# Patient Record
Sex: Female | Born: 1945
Health system: Southern US, Community
[De-identification: ages and names within clinical notes are randomized; demographics above are authoritative.]

## PROBLEM LIST (undated history)

## (undated) DIAGNOSIS — H269 Unspecified cataract: Secondary | ICD-10-CM

## (undated) DIAGNOSIS — T7840XA Allergy, unspecified, initial encounter: Secondary | ICD-10-CM

## (undated) DIAGNOSIS — E559 Vitamin D deficiency, unspecified: Secondary | ICD-10-CM

## (undated) DIAGNOSIS — G709 Myoneural disorder, unspecified: Secondary | ICD-10-CM

## (undated) DIAGNOSIS — K219 Gastro-esophageal reflux disease without esophagitis: Secondary | ICD-10-CM

## (undated) DIAGNOSIS — E119 Type 2 diabetes mellitus without complications: Secondary | ICD-10-CM

## (undated) DIAGNOSIS — J45909 Unspecified asthma, uncomplicated: Secondary | ICD-10-CM

## (undated) DIAGNOSIS — F329 Major depressive disorder, single episode, unspecified: Secondary | ICD-10-CM

## (undated) DIAGNOSIS — L509 Urticaria, unspecified: Secondary | ICD-10-CM

## (undated) DIAGNOSIS — F32A Depression, unspecified: Secondary | ICD-10-CM

## (undated) DIAGNOSIS — J449 Chronic obstructive pulmonary disease, unspecified: Secondary | ICD-10-CM

## (undated) DIAGNOSIS — I10 Essential (primary) hypertension: Secondary | ICD-10-CM

## (undated) DIAGNOSIS — Z8619 Personal history of other infectious and parasitic diseases: Secondary | ICD-10-CM

## (undated) DIAGNOSIS — IMO0001 Reserved for inherently not codable concepts without codable children: Secondary | ICD-10-CM

## (undated) DIAGNOSIS — IMO0002 Reserved for concepts with insufficient information to code with codable children: Secondary | ICD-10-CM

## (undated) DIAGNOSIS — M1990 Unspecified osteoarthritis, unspecified site: Secondary | ICD-10-CM

## (undated) HISTORY — DX: Myoneural disorder, unspecified: G70.9

## (undated) HISTORY — DX: Unspecified asthma, uncomplicated: J45.909

## (undated) HISTORY — PX: TUBAL LIGATION: SHX77

## (undated) HISTORY — DX: Allergy, unspecified, initial encounter: T78.40XA

## (undated) HISTORY — DX: Essential (primary) hypertension: I10

## (undated) HISTORY — DX: Depression, unspecified: F32.A

## (undated) HISTORY — PX: TONSILLECTOMY: SUR1361

## (undated) HISTORY — DX: Vitamin D deficiency, unspecified: E55.9

## (undated) HISTORY — DX: Reserved for concepts with insufficient information to code with codable children: IMO0002

## (undated) HISTORY — DX: Chronic obstructive pulmonary disease, unspecified: J44.9

## (undated) HISTORY — DX: Unspecified cataract: H26.9

## (undated) HISTORY — DX: Personal history of other infectious and parasitic diseases: Z86.19

## (undated) HISTORY — PX: ABDOMINAL HYSTERECTOMY: SHX81

## (undated) HISTORY — DX: Major depressive disorder, single episode, unspecified: F32.9

## (undated) HISTORY — PX: APPENDECTOMY: SHX54

## (undated) HISTORY — DX: Type 2 diabetes mellitus without complications: E11.9

## (undated) HISTORY — DX: Urticaria, unspecified: L50.9

---

## 2000-09-17 ENCOUNTER — Other Ambulatory Visit: Admission: RE | Admit: 2000-09-17 | Discharge: 2000-09-17 | Payer: Self-pay | Admitting: Family Medicine

## 2006-08-08 ENCOUNTER — Encounter: Admission: RE | Admit: 2006-08-08 | Discharge: 2006-08-08 | Payer: Self-pay | Admitting: Family Medicine

## 2006-08-08 LAB — HM MAMMOGRAPHY: HM Mammogram: NORMAL

## 2007-01-18 ENCOUNTER — Emergency Department (HOSPITAL_COMMUNITY): Admission: EM | Admit: 2007-01-18 | Discharge: 2007-01-18 | Payer: Self-pay | Admitting: Family Medicine

## 2007-01-25 ENCOUNTER — Emergency Department (HOSPITAL_COMMUNITY): Admission: EM | Admit: 2007-01-25 | Discharge: 2007-01-26 | Payer: Self-pay | Admitting: Emergency Medicine

## 2007-05-28 ENCOUNTER — Emergency Department (HOSPITAL_COMMUNITY): Admission: EM | Admit: 2007-05-28 | Discharge: 2007-05-28 | Payer: Self-pay | Admitting: Emergency Medicine

## 2007-05-29 ENCOUNTER — Encounter (INDEPENDENT_AMBULATORY_CARE_PROVIDER_SITE_OTHER): Payer: Self-pay | Admitting: Surgery

## 2007-05-29 ENCOUNTER — Ambulatory Visit (HOSPITAL_COMMUNITY): Admission: EM | Admit: 2007-05-29 | Discharge: 2007-05-30 | Payer: Self-pay | Admitting: Emergency Medicine

## 2007-05-30 HISTORY — PX: CHOLECYSTECTOMY: SHX55

## 2007-08-08 LAB — HM PAP SMEAR: HM Pap smear: NORMAL

## 2007-08-19 ENCOUNTER — Ambulatory Visit (HOSPITAL_COMMUNITY): Admission: RE | Admit: 2007-08-19 | Discharge: 2007-08-19 | Payer: Self-pay | Admitting: Family Medicine

## 2009-02-26 IMAGING — CR DG TIBIA/FIBULA 2V*R*
2 series · 2 of 2 positions shown · non-contrast
Comparison: none

CLINICAL DATA: Trauma

Right tibia-fibula 2 view:
No previous for comparison. There is no evidence of fracture.  Normal alignment.
There is no evidence of arthropathy or other focal bone abnormality.  Soft
tissues are unremarkable.

[view not recorded (1 of 2)]
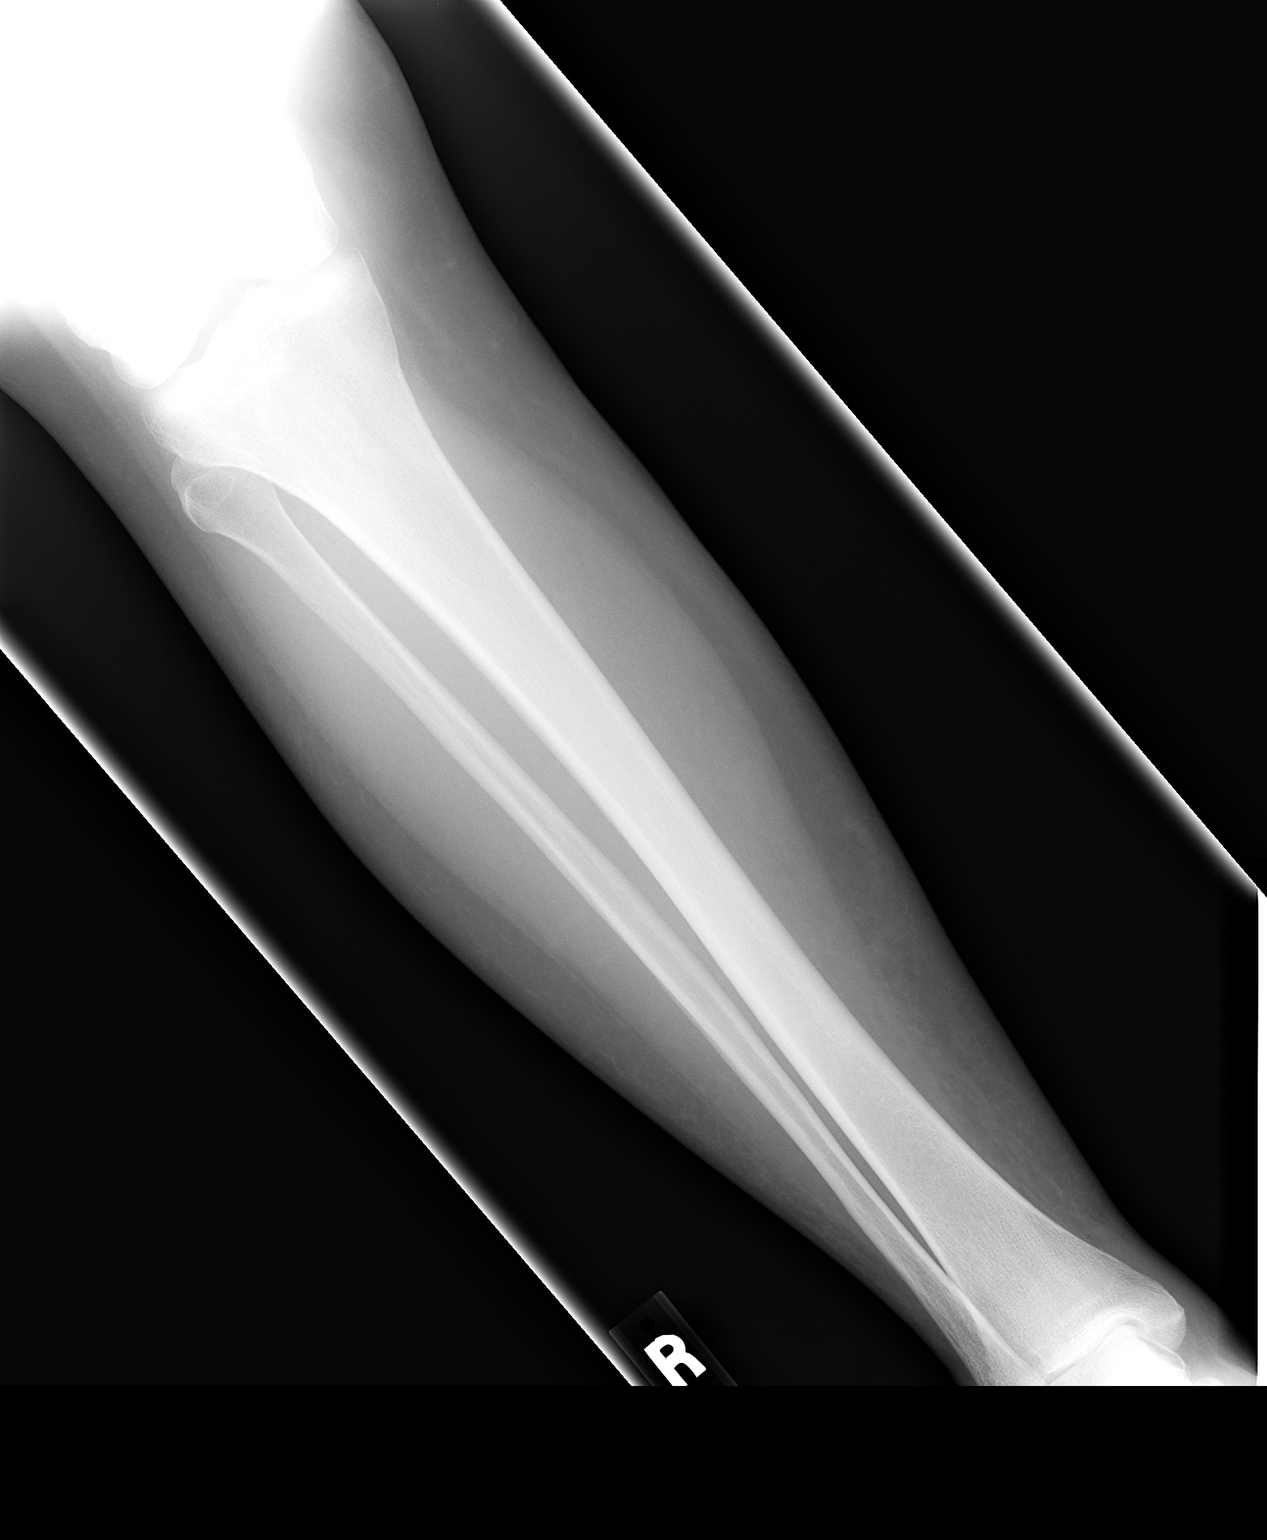

[view not recorded (2 of 2)]
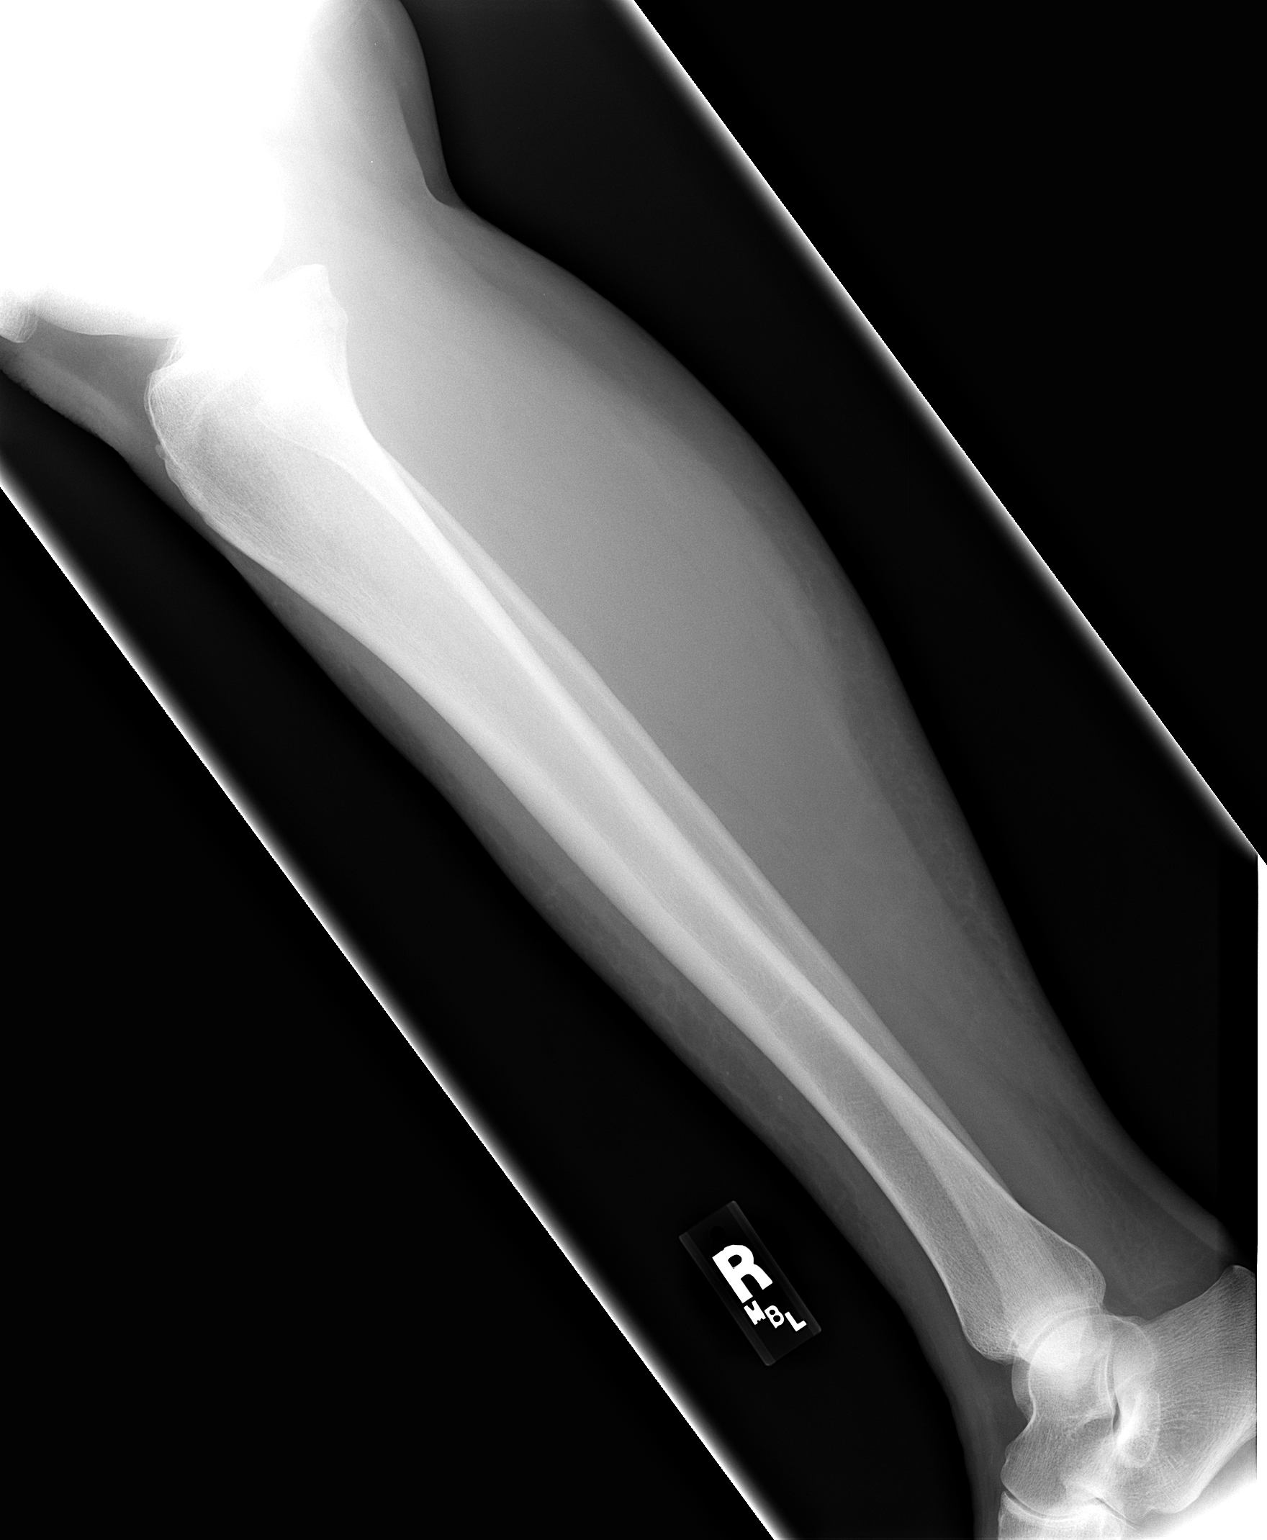

[2 of 2 positions shown; findings below may reference images not displayed]

IMPRESSION: Negative.

## 2010-02-19 ENCOUNTER — Encounter: Payer: Self-pay | Admitting: Family Medicine

## 2010-03-01 ENCOUNTER — Encounter: Payer: Self-pay | Admitting: Family Medicine

## 2010-06-13 NOTE — Consult Note (Signed)
Amanda Pope, Amanda Pope                 ACCOUNT NO.:  [id number]   MEDICAL RECORD NO.:  [id number]          PATIENT TYPE:  INP   LOCATION:  0105                         FACILITY:  [facility]   PHYSICIAN:  Ardeth Sportsman, MD     DATE OF BIRTH:  Jul 26, 1945   DATE OF CONSULTATION:  05/29/2007  DATE OF DISCHARGE:                                 CONSULTATION   PRIMARY CARE PHYSICIAN:  Amanda Pope in [facility].   REASON FOR CONSULTATION AND ADMISSION:  Probable acute cholecystitis.   HISTORY OF PRESENT ILLNESS:  Amanda Pope is a 65 year old female, otherwise  rather healthy, with some intermittent episodes of abdominal pain for  the past few months.  She had been given her Prilosec that seemed to  help it.  It seemed resolved, but she had noted that at night she  occasionally gets some pain with some nausea and bloating.  It has  typically been left of midline and radiating to her back.  She had a  worsening episode 2 days ago and went to the emergency room where she  was evaluated with evidence of perhaps some early cholecystitis on CT  scan, but she was not particularly tender on the right upper quadrant.  She was recommended to have followup with her primary care physician.  She saw Amanda Pope today who was more concerned about cholecystitis  since the patient's pain seemed to be more focused now on the right  upper quadrant with some nausea.  She has had markedly decreased  appetite.  She had some subjective fevers but no definite chills or  sweats.  She denies any sick contacts or travel history.  No significant  amount of constipation.  This pain seems to be a little bit different  than her other pain.  She did try some Tums and Rolaids last night  without any improvement.   PAST MEDICAL HISTORY:  1. Hypertension.  2. Asthma.  3. Chronic pain.  4. Question of gastroparesis and peptic ulcer disease but no objective      diagnosis of this by endoscopy or other  evaluation.   SURGERIES:  She had abdominal hysterectomy many years ago for bleeding  but not cancer.   SOCIAL HISTORY:  She works Research scientist (life sciences) in [employer] and was  rather busy with this.  She can walk up to a mile a day and is rather  physically active at work.  She smokes about 1/2 pack a day, giving her  probably about a 30-pack/year history of tobacco since she tapered down.  She occasionally drinks some alcohol but no severe binging or other  abnormalities.  No drug use.  She is here today with her son and lives  close to her family.   FAMILY HISTORY:  Noncontributory for any major hepatobiliary or  pancreatic disorders or any major GI tract disorders, irritable bowel  syndrome, or irritable bowel syndrome.   ALLERGIES:  NO KNOWN DRUG ALLERGIES.   MEDICATIONS:  Premarin, amlodipine, Advair Diskus, aspirin,  hydromorphone, Dilaudid, and promethazine, and she occasionally takes  Prilosec over the  counter.   REVIEW OF SYSTEMS:  Noted per HPI.  CONSTITUTIONAL:  She has had a  little bit of weight loss recently secondary decreased appetite.  ENT,  cardiac, respiratory, musculoskeletal, neurological, psychiatric, HEENT,  lymph, dermatological, and breast are otherwise negative urine a CT of  the neck, but hepatic and renal otherwise negative.  No history of UTIs  or renal colic for or kidney stones.  GI: As noted above.  No  hematemesis medication or melena dermatologic otherwise negative.   PHYSICAL EXAMINATION:  VITAL SIGNS: Her temperature is 99.5.  Pulse was  100, respirations 20.  Her pain was 8/10 initially, now down to about  4/10 after getting some IV narcotic medications.  Oxygen saturations 92%  on room air.  Her blood pressure is 122/65.  GENERAL:  She is a well-developed, well-nourished overweight not frankly  obese female lying uncomfortable but not frankly toxic.  PSYCH:  She is pleasant and interactive with at least average  intelligence.  No evidence of any  dementia, delirium, psychosis, or  paranoia.  HEENT:  Eyes: Pupils are equally round and reactive to light.  Extraocular movements are intact.  The sclerae are not icteric or  injected.  NEUROLOGICAL:  Cranial nerves II through XII are intact.  Hand grips  5/5, equal, and symmetrical.  No resting or intention tremors.  No  focal, sensory, or motor deficits.  HEENT:  She is normocephalic with no facial asymmetry.  Her mucous  membranes are dry but nasopharynx and oropharynx clear.  NECK:  Supple without any masses.  Trachea is midline.  HEART:  Regular rate and rhythm.  No murmurs, gallops, or rubs.  CHEST:  Clear to auscultation bilaterally.  No wheezes, rales, or  rhonchi.  No pain to rib or sternal compression.  ABDOMEN:  She is overweight but soft.  She is exquisitely tender in the  right upper quadrant with a positive Murphy's sign.  The left upper  quadrant is mildly uncomfortable.  She has a well-healed infraumbilical  midline incision with no obvious incisional or inguinal hernias.  GENITOURINARY:  Normal external female genitalia.  She is wearing a pad  but no obvious vaginal bleeding or discharge.  No inguinal hernias.  RECTAL:  Deferred per patient request.  MUSCULOSKELETAL:  Full range of motion in the shoulders, elbows, and  wrists as well as hips, knees, and ankles and her cervical, thoracic,  lumbar, and sacral spine.  LYMPH:  No head, axial, or groin lymphadenopathy.  BREASTS:  No obvious discharge or skin changes.   LABORATORY VALUES:  Her white count was 13.7, now 17.90.  Liver function  tests:  She has a bilirubin of 1.9 which is elevated from 0.7 yesterday.  Alkaline phosphatase was elevated as well.  She has a CT scan which  shows some mild gallbladder wall thickening and maybe a little bit of  fluid and some definite sludge and probable stones in the gallbladder  but no ductal dilatation.  There is no evidence of any free fluid or  free air.  This was done last  night about 30 hours ago.   ASSESSMENT AND PLAN:  The patient is a 65 year old female with  intermittent upper abdominal pain now focused on the right upper  quadrant with positive Murphy's sign with leukocytosis and CT scan, all  concerning for cholecystitis.   The anatomy and physiology of hepatobiliary and pancreatic function were  discussed.  Her history of gallstones with pathophysiology was  discussed.  The  risks of cholecystitis, sepsis, shock, perforation, and  death were discussed.  The options were discussed and recommendations  made for diagnostic laparoscopy with probable laparoscopic  cholecystectomy with intraoperative cholangiogram.  The possibilities of  converting to open or the need for a  drainage tube and with delayed cholecystectomy were discussed as well,  and numerous other risks were discussed as well.  Alternatives and  benefits were discussed, and questions were answered.  She and her son  agreed to proceed.  We will proceed with this, hopefully tonight.      Ardeth Sportsman, MD  Electronically Signed     SCG/MEDQ  D:  05/29/2007  T:  05/29/2007  Job:  [id number]   cc:   Doug Sou, M.D.  Fax: [phone]   Priscille Heidelberg. Pamalee Leyden, MD

## 2010-06-13 NOTE — Op Note (Signed)
Amanda Pope, Amanda Pope                 ACCOUNT NO.:  [id number]   MEDICAL RECORD NO.:  [id number]          PATIENT TYPE:  INP   LOCATION:  0105                         FACILITY:  [facility]   PHYSICIAN:  Ardeth Sportsman, MD     DATE OF BIRTH:  Sep 01, 1945   DATE OF PROCEDURE:  05/29/2007  DATE OF DISCHARGE:                               OPERATIVE REPORT   PRIMARY CARE PHYSICIAN:  Dr. Tanya Nones, I believe, of [facility].   SURGEON:  Dr. Karie Soda.   PREOPERATIVE DIAGNOSIS:  Acute cholecystitis.   POSTOPERATIVE DIAGNOSES:  1. Acute cholecystitis.  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   PROCEDURE PERFORMED:  Laparoscopic cholecystectomy with intraoperative  cholangiogram.   ANESTHESIA:  1. General anesthesia.  2. Local anesthetic as a specimen block around all port sites.   SPECIMEN:  Gallbladder.   DRAINS:  None.   ESTIMATED BLOOD LOSS:  Less than 30 mL.   COMPLICATIONS:  None apparent.   INDICATIONS:  Amanda Pope is a 65 year old female with intermittent  episodes of upper abdominal pain, primary left side, who recently had  worsening pain.  She went to the emergency room but seemed to improve  and went home.  However, she worsened and went and saw her primary care  physician who was concerned by cholecystitis.  CAT scan done a day and a  half ago had showed some pericholecystic fluid as well as some concern  for cholecystitis.  She was sent to the emergency room, and Dr.  Ethelda Chick confirmed this.  Surgical consult was requested, and I agreed  she had concern for cholecystitis.   The anatomy and physiology of the digestive tract was explained and,  hepatobiliary and pancreatic function was explained.  Pathophysiology of  gallstones and natural history of cholecystitis was discussed.  Options  discussed.  Recommendations made for admission IV antibiotics and  laparoscopic cholecystectomy with intraoperative cholangiogram.  Risks,  benefits, and alternatives  were discussed in detail.  Questions  answered.  She agreed to proceed.   FINDINGS:  She had definite gallbladder wall thickening and turgid  gallbladder.  She had an impacted stone in the neck of her gallbladder.  Her cholangiogram appeared to be normal with exception I think she has a  decent size duodenal diverticulum.   DESCRIPTION OF PROCEDURE:  Informed consent was confirmed.  The patient  had already received IV Unasyn.  She had sequential compression devices  active during the entire case.  She voided just prior to going to the  operating room.  She was positioned supine with both arms tucked.  She  underwent general anesthesia without any difficulty.  Her abdomen was  prepped and draped in sterile fashion.   A final port was placed in the right upper quadrant with the patient in  steep reversed Trendelenburg right-side up for classic optical entry  technique with a 5-mm 0 degree scope.  Capnoperitoneum to 15 mL provided  good abdominal insufflation.  Under direct visualization, final port was  placed in the right flank, and a 10-mm port was tunneled through the  falciform  ligament in the epigastric region.  There was some moderate  fusions from her periumbilical midline incision.  I freed these off with  some cold scissors and got enough working space to place a final port  just to the right of her umbilicus.   Camera inspection revealed findings noted above.  I was able to peel off  omentum and duodenum and transverse mesial colon off of the gallbladder  primarily with some controlled blunt dissection as well as focused  cautery.  This allowed the gallbladder fundus to be grasped and elevated  cephalad.  The peritoneal coverings were freed off between the  gallbladder and liver on the anterior medial and posterior lateral  aspects.  Circumferential dissection was done to free the proximal third  of the gallbladder off the liver bed to get a good posterior window for  the  classic critical view.  The anterior branch of the cystic artery was  easily seen, skeletonized, and with one clip on the gallbladder and two  clips slightly proximally.  Dissection there encountered the posterior  branches of the cystic artery which had a little bit of bleeding, and  this is where most of the bleeding occurred.  After careful  skeletonization, I was able to place a clip on the gallbladder side and  two clip slightly proximal.  This was transected.  This left one  structure going from the fundus of the gallbladder down.   This left one structure going from the fundus of the gallbladder down  the porta hepatis.  Two clips were placed on the infundibulum, and a  partial cyst ductotomy was performed.  There was some sludge but no  definite stones.  In placing tension, the cystic duct actually shredded  at the infundibular stump.  I was able to get a 5-French  cholangiocatheter from subcostal incision and placed in the cystic duct  stump and advance it.  Clip was placed for occlusion.  There was no  evidence of leak.  Cholangiogram was run using diluted radio-opaque  contrast and continuous fluoroscopy.  Contrast flowed well from a side  branch consistent with cystic duct cannulization.  Contrast flowed well  into the common hepatic duct up the right and left intrahepatic chains  and down the common bile duct, across the ampulla into the duodenum.  Interestingly, 5 seconds after being in the duodenum and duodenal  started peristalsis, a blind pouch was filled up.  Initially, I thought  this maybe was the pylorus, but I did another run, and it looks like it  is a blind balloon.  This may correlate with probable duodenal  diverticulum that was seen on her CT scan of decent size.  However,  there was no leak of contrast around this or extravasation of concern.   Catheter was removed.  A 0 PDS Endoloop was placed around the cystic  duct stump, and a few clips were placed distal  to that.  The gallbladder  was freed from its remaining attachments of the liver, placed in an  EndoCatch bag, and removed out the subxiphoid port site with some  dilation at the fascia and then scalpel skin since had significant  gallbladder wall thickening.  Fascial defect was reapproximated using 0  Vicryl using laparoscopic suture passer under visualization.   Four liters copious irrigation was done with clear return at the end.  Hemostasis was excellent.  Inspection revealed no injury to the  duodenum, transverse colon or mesocolon or liver bed.  I  could not see  any obvious significant irritation or evidence of perforation to the  duodenum or other structures.  Capnoperitoneum was evacuated.  Ports  were removed.  Fascial stitch was tied down.  Skin was closed with 4-0  Monocryl stitch.  Sterile dressings applied.  The patient was extubated  and taken to the recovery room in stable condition.   I am about to explain the operative findings to the patient's family.  Postoperative instructions will be discussed as well.  Keep her at least  overnight with IV antibiotics, and depending on her respiratory status  and pain control is, she may go as soon as tomorrow but positive longer  depending on how she recovers.      Ardeth Sportsman, MD  Electronically Signed     SCG/MEDQ  D:  05/29/2007  T:  05/29/2007  Job:  [id number]

## 2010-10-24 LAB — CBC
Hemoglobin: 14.2
MCHC: 34.9
RBC: 4.71
RBC: 4.91
RDW: 12.9
WBC: 13.7 — ABNORMAL HIGH

## 2010-10-24 LAB — COMPREHENSIVE METABOLIC PANEL
ALT: 13
ALT: 30
AST: 15
AST: 27
Albumin: 3.7
CO2: 26
Calcium: 9.4
Chloride: 100
Creatinine, Ser: 0.52
GFR calc Af Amer: >60
GFR calc Af Amer: >60
GFR calc non Af Amer: >60
Sodium: 135
Total Bilirubin: 0.7
Total Protein: 7.2

## 2010-10-24 LAB — POCT I-STAT, CHEM 8
BUN: 8
Calcium, Ion: 1.14
Chloride: 100
Glucose, Bld: 149 — ABNORMAL HIGH

## 2010-10-24 LAB — URINALYSIS, ROUTINE W REFLEX MICROSCOPIC
Leukocytes, UA: NEGATIVE
Protein, ur: NEGATIVE
Specific Gravity, Urine: 1.016
Urobilinogen, UA: 0.2

## 2010-10-24 LAB — URINE CULTURE: Colony Count: 30000

## 2010-10-24 LAB — DIFFERENTIAL
Basophils Absolute: 0.1
Eosinophils Absolute: 0.5
Eosinophils Relative: 3
Lymphocytes Relative: 17
Lymphs Abs: 2.3
Lymphs Abs: 3.3
Monocytes Absolute: 0.5
Monocytes Relative: 10
Monocytes Relative: 4
Neutro Abs: 10.5 — ABNORMAL HIGH

## 2010-10-24 LAB — LIPASE, BLOOD: Lipase: 15

## 2010-10-24 LAB — COMPREHENSIVE METABOLIC PANEL WITH GFR
Albumin: 4
Alkaline Phosphatase: 119 — ABNORMAL HIGH
BUN: 7
Calcium: 9.5
Glucose, Bld: 146 — ABNORMAL HIGH
Potassium: 3.6
Sodium: 137
Total Protein: 7.2

## 2010-10-24 LAB — URINE MICROSCOPIC-ADD ON

## 2010-11-03 LAB — I-STAT 8, (EC8 V) (CONVERTED LAB)
Glucose, Bld: 117 — ABNORMAL HIGH
HCT: 44
Hemoglobin: 15
Operator id: 261381
Potassium: 3.7
Sodium: 138
TCO2: 34

## 2010-11-03 LAB — CBC
HCT: 43.4
MCV: 89.2
Platelets: 337
RDW: 12.3

## 2010-11-03 LAB — DIFFERENTIAL
Basophils Absolute: 0.1
Eosinophils Absolute: 1.2 — ABNORMAL HIGH
Eosinophils Relative: 11 — ABNORMAL HIGH
Lymphs Abs: 4.1 — ABNORMAL HIGH
Monocytes Absolute: 0.7

## 2010-11-03 LAB — COMPREHENSIVE METABOLIC PANEL
Albumin: 3.7
BUN: 8
Calcium: 9.4
Creatinine, Ser: 0.58
Glucose, Bld: 119 — ABNORMAL HIGH
Potassium: 3.5
Total Protein: 6.4

## 2010-11-03 LAB — POCT I-STAT CREATININE: Operator id: 261381

## 2010-11-03 LAB — LIPASE, BLOOD: Lipase: 20

## 2010-11-03 LAB — POCT CARDIAC MARKERS: Operator id: 261381

## 2012-04-24 ENCOUNTER — Telehealth: Payer: Self-pay | Admitting: Family Medicine

## 2012-04-24 MED ORDER — AMLODIPINE BESYLATE 5 MG PO TABS: 5.0000 mg | ORAL_TABLET | Freq: Every day | ORAL | Status: DC

## 2012-04-24 NOTE — Telephone Encounter (Signed)
rx refilled.

## 2012-07-02 ENCOUNTER — Telehealth: Payer: Self-pay | Admitting: Physician Assistant

## 2012-07-02 MED ORDER — ESTRADIOL 0.5 MG PO TABS: 0.5000 mg | ORAL_TABLET | Freq: Every day | ORAL | Status: DC

## 2012-07-02 NOTE — Telephone Encounter (Signed)
Medication refilled per protocol. 

## 2012-08-27 ENCOUNTER — Other Ambulatory Visit: Payer: Self-pay | Admitting: Family Medicine

## 2012-10-29 ENCOUNTER — Encounter: Payer: Self-pay | Admitting: Family Medicine

## 2012-10-29 ENCOUNTER — Other Ambulatory Visit: Payer: Self-pay | Admitting: Physician Assistant

## 2012-10-29 DIAGNOSIS — J449 Chronic obstructive pulmonary disease, unspecified: Secondary | ICD-10-CM | POA: Insufficient documentation

## 2012-10-29 DIAGNOSIS — F329 Major depressive disorder, single episode, unspecified: Secondary | ICD-10-CM | POA: Insufficient documentation

## 2012-10-29 DIAGNOSIS — IMO0002 Reserved for concepts with insufficient information to code with codable children: Secondary | ICD-10-CM | POA: Insufficient documentation

## 2012-10-29 DIAGNOSIS — I10 Essential (primary) hypertension: Secondary | ICD-10-CM | POA: Insufficient documentation

## 2012-10-29 DIAGNOSIS — Z8619 Personal history of other infectious and parasitic diseases: Secondary | ICD-10-CM | POA: Insufficient documentation

## 2012-10-29 DIAGNOSIS — E559 Vitamin D deficiency, unspecified: Secondary | ICD-10-CM | POA: Insufficient documentation

## 2012-10-29 NOTE — Telephone Encounter (Signed)
Medication refilled per protocol. 

## 2013-01-25 ENCOUNTER — Other Ambulatory Visit: Payer: Self-pay | Admitting: Family Medicine

## 2013-03-28 ENCOUNTER — Other Ambulatory Visit: Payer: Self-pay | Admitting: Family Medicine

## 2013-03-31 NOTE — Telephone Encounter (Signed)
Patient has not been seen by [facility]. Requires office visit before any refills can be given.

## 2013-04-04 ENCOUNTER — Other Ambulatory Visit: Payer: Self-pay | Admitting: Family Medicine

## 2013-05-05 ENCOUNTER — Telehealth: Payer: Self-pay | Admitting: Family Medicine

## 2013-05-05 MED ORDER — AMLODIPINE BESYLATE 5 MG PO TABS: 5.0000 mg | ORAL_TABLET | Freq: Every day | ORAL | Status: DC

## 2013-05-05 NOTE — Telephone Encounter (Signed)
Medication refilled per protocol. 

## 2013-07-03 ENCOUNTER — Other Ambulatory Visit: Payer: Self-pay | Admitting: Family Medicine

## 2013-08-07 ENCOUNTER — Other Ambulatory Visit: Payer: Self-pay | Admitting: Family Medicine

## 2013-08-07 ENCOUNTER — Encounter: Payer: Self-pay | Admitting: Family Medicine

## 2013-08-07 MED ORDER — AMLODIPINE BESYLATE 5 MG PO TABS: 5.0000 mg | ORAL_TABLET | Freq: Every day | ORAL | Status: DC

## 2013-08-07 NOTE — Telephone Encounter (Signed)
Rx Refilled and needs ov - will send letter to schedule

## 2013-08-25 ENCOUNTER — Other Ambulatory Visit: Payer: Self-pay | Admitting: Physician Assistant

## 2013-08-25 ENCOUNTER — Other Ambulatory Visit: Payer: Medicare Other

## 2013-08-25 DIAGNOSIS — E559 Vitamin D deficiency, unspecified: Secondary | ICD-10-CM

## 2013-08-25 DIAGNOSIS — I10 Essential (primary) hypertension: Secondary | ICD-10-CM

## 2013-08-25 DIAGNOSIS — Z79899 Other long term (current) drug therapy: Secondary | ICD-10-CM

## 2013-08-25 DIAGNOSIS — F329 Major depressive disorder, single episode, unspecified: Secondary | ICD-10-CM

## 2013-08-25 DIAGNOSIS — F3289 Other specified depressive episodes: Secondary | ICD-10-CM

## 2013-08-25 DIAGNOSIS — Z0000 Encounter for general adult medical examination without abnormal findings: Secondary | ICD-10-CM

## 2013-08-25 LAB — COMPLETE METABOLIC PANEL WITH GFR
ALK PHOS: 99 U/L (ref 39–117)
ALT: 27 U/L (ref 0–35)
AST: 18 U/L (ref 0–37)
Albumin: 4.1 g/dL (ref 3.5–5.2)
BUN: 13 mg/dL (ref 6–23)
CO2: 27 mEq/L (ref 19–32)
CREATININE: 0.78 mg/dL (ref 0.50–1.10)
Calcium: 9.5 mg/dL (ref 8.4–10.5)
Chloride: 105 mEq/L (ref 96–112)
GFR, Est African American: >89 mL/min
GFR, Est Non African American: 79 mL/min
Glucose, Bld: 125 mg/dL — ABNORMAL HIGH (ref 70–99)
Potassium: 4.6 mEq/L (ref 3.5–5.3)
Sodium: 143 mEq/L (ref 135–145)
Total Bilirubin: 0.7 mg/dL (ref 0.2–1.2)
Total Protein: 6.8 g/dL (ref 6.0–8.3)

## 2013-08-25 LAB — CBC WITH DIFFERENTIAL/PLATELET
BASOS PCT: 1 % (ref 0–1)
Basophils Absolute: 0.1 10*3/uL (ref 0.0–0.1)
EOS PCT: 5 % (ref 0–5)
Eosinophils Absolute: 0.4 10*3/uL (ref 0.0–0.7)
HEMATOCRIT: 42.8 % (ref 36.0–46.0)
HEMOGLOBIN: 14.5 g/dL (ref 12.0–15.0)
Lymphocytes Relative: 34 % (ref 12–46)
Lymphs Abs: 2.5 10*3/uL (ref 0.7–4.0)
MCH: 29.4 pg (ref 26.0–34.0)
MCHC: 33.9 g/dL (ref 30.0–36.0)
MCV: 86.6 fL (ref 78.0–100.0)
MONO ABS: 1 10*3/uL (ref 0.1–1.0)
MONOS PCT: 13 % — AB (ref 3–12)
NEUTROS ABS: 3.5 10*3/uL (ref 1.7–7.7)
Neutrophils Relative %: 47 % (ref 43–77)
Platelets: 230 10*3/uL (ref 150–400)
RBC: 4.94 MIL/uL (ref 3.87–5.11)
RDW: 13.6 % (ref 11.5–15.5)
WBC: 7.4 10*3/uL (ref 4.0–10.5)

## 2013-08-25 LAB — LIPID PANEL
Cholesterol: 163 mg/dL (ref 0–200)
HDL: 45 mg/dL (ref >39)
LDL CALC: 89 mg/dL (ref 0–99)
TRIGLYCERIDES: 146 mg/dL (ref <150)
Total CHOL/HDL Ratio: 3.6 Ratio
VLDL: 29 mg/dL (ref 0–40)

## 2013-08-25 LAB — TSH: TSH: 6.996 u[IU]/mL — ABNORMAL HIGH (ref 0.350–4.500)

## 2013-08-26 LAB — HEMOGLOBIN A1C
HEMOGLOBIN A1C: 6.2 % — AB (ref <5.7)
MEAN PLASMA GLUCOSE: 131 mg/dL — AB (ref <117)

## 2013-08-26 LAB — T4, FREE: FREE T4: 0.96 ng/dL (ref 0.80–1.80)

## 2013-08-26 LAB — VITAMIN D 25 HYDROXY (VIT D DEFICIENCY, FRACTURES): VIT D 25 HYDROXY: 46 ng/mL (ref 30–89)

## 2013-08-27 ENCOUNTER — Ambulatory Visit (INDEPENDENT_AMBULATORY_CARE_PROVIDER_SITE_OTHER): Payer: Medicare Other | Admitting: Physician Assistant

## 2013-08-27 ENCOUNTER — Encounter: Payer: Self-pay | Admitting: Physician Assistant

## 2013-08-27 VITALS — BP 132/88 | HR 72 | Temp 97.9°F | Resp 14 | Ht 64.0 in | Wt 207.0 lb

## 2013-08-27 DIAGNOSIS — E038 Other specified hypothyroidism: Secondary | ICD-10-CM

## 2013-08-27 DIAGNOSIS — J209 Acute bronchitis, unspecified: Secondary | ICD-10-CM

## 2013-08-27 DIAGNOSIS — J439 Emphysema, unspecified: Secondary | ICD-10-CM

## 2013-08-27 DIAGNOSIS — R739 Hyperglycemia, unspecified: Secondary | ICD-10-CM | POA: Insufficient documentation

## 2013-08-27 DIAGNOSIS — Z79890 Hormone replacement therapy (postmenopausal): Secondary | ICD-10-CM | POA: Insufficient documentation

## 2013-08-27 DIAGNOSIS — E039 Hypothyroidism, unspecified: Secondary | ICD-10-CM | POA: Insufficient documentation

## 2013-08-27 DIAGNOSIS — Z0000 Encounter for general adult medical examination without abnormal findings: Secondary | ICD-10-CM | POA: Insufficient documentation

## 2013-08-27 DIAGNOSIS — I10 Essential (primary) hypertension: Secondary | ICD-10-CM

## 2013-08-27 DIAGNOSIS — R7309 Other abnormal glucose: Secondary | ICD-10-CM

## 2013-08-27 DIAGNOSIS — Z23 Encounter for immunization: Secondary | ICD-10-CM

## 2013-08-27 DIAGNOSIS — J438 Other emphysema: Secondary | ICD-10-CM

## 2013-08-27 DIAGNOSIS — R197 Diarrhea, unspecified: Secondary | ICD-10-CM

## 2013-08-27 MED ORDER — CHOLESTYRAMINE LIGHT 4 G PO PACK: 4.0000 g | PACK | Freq: Two times a day (BID) | ORAL | Status: DC

## 2013-08-27 MED ORDER — ESTRADIOL 0.5 MG PO TABS: ORAL_TABLET | ORAL | Status: DC

## 2013-08-27 MED ORDER — AZITHROMYCIN 250 MG PO TABS: ORAL_TABLET | ORAL | Status: DC

## 2013-08-27 MED ORDER — FLUTICASONE-SALMETEROL 250-50 MCG/DOSE IN AEPB: 1.0000 | INHALATION_SPRAY | Freq: Two times a day (BID) | RESPIRATORY_TRACT | Status: DC

## 2013-08-27 MED ORDER — AMLODIPINE BESYLATE 5 MG PO TABS: 5.0000 mg | ORAL_TABLET | Freq: Every day | ORAL | Status: DC

## 2013-08-27 MED ORDER — PREDNISONE 20 MG PO TABS: ORAL_TABLET | ORAL | Status: DC

## 2013-08-27 MED ORDER — ALBUTEROL SULFATE HFA 108 (90 BASE) MCG/ACT IN AERS: 2.0000 | INHALATION_SPRAY | Freq: Four times a day (QID) | RESPIRATORY_TRACT | Status: DC | PRN

## 2013-08-27 NOTE — Progress Notes (Signed)
Patient ID: Amanda Pope MRN: [id number], DOB: 04-Aug-1945, 68 y.o. Date of Encounter: 08/27/2013,   Chief Complaint: Physical (CPE)  HPI: 68 y.o. y/o female  here for CPE.   Her last office visit here was 03/25/2012. She states that she sees no other types of doctors or specialist or any other medical providers. Any complaints/symptoms that came up during the visit today are listed under the assessment and plan at the end of the note.  Subjective:   Patient presents for Medicare Annual/Subsequent preventive examination.   Review Past Medical/Family/Social: These are all documented below in each of these sections.   Risk Factors  Current exercise habits:  She keeps 10-year-old great-grandchild and chases him around the house. Dietary issues discussed: Today I gave and reviewed low carbohydrate handout sheet at length. In addition to discussing specific foods to limit, possibly discuss specific foods she can and should eat.  Cardiac risk factors: Obesity (BMI >= 30 kg/m2).   Depression Screen  (Note: if answer to either of the following is "Yes", a more complete depression screening is indicated)  Over the past two weeks, have you felt down, depressed or hopeless? No Over the past two weeks, have you felt little interest or pleasure in doing things? No Have you lost interest or pleasure in daily life? No Do you often feel hopeless? No Do you cry easily over simple problems? No   Activities of Daily Living  In your present state of health, do you have any difficulty performing the following activities?:  Driving? No  Managing money? No  Feeding yourself? No  Getting from bed to chair? No  Climbing a flight of stairs? No  Preparing food and eating?: No  Bathing or showering? No  Getting dressed: No  Getting to the toilet? No  Using the toilet:No  Moving around from place to place: No  In the past year have you fallen or had a near fall?:No  Are you sexually active? No    Do you have more than one partner? No   Hearing Difficulties: No  Do you often ask people to speak up or repeat themselves? No  Do you experience ringing or noises in your ears? No Do you have difficulty understanding soft or whispered voices? No  Do you feel that you have a problem with memory? No Do you often misplace items? No  Do you feel safe at home? Yes  Cognitive Testing  Alert? Yes Normal Appearance?Yes  Oriented to person? Yes Place? Yes  Time? Yes  Recall of three objects? Yes  Can perform simple calculations? Yes  Displays appropriate judgment?Yes  Can read the correct time from a watch face?Yes   List the Names of Other Physician/Practitioners you currently use: None   Indicate any recent Medical Services you may have received from other than [facility] providers in the past year (date may be approximate).   Screening Tests / Date---   Information regarding each of these is listed under the assessment and plan section at the end of the note. Colonoscopy                     Zostavax  Mammogram  Influenza Vaccine  Tetanus/tdap    Assessment:    Annual wellness medicare exam   Plan:    During the course of the visit the patient was educated and counseled about appropriate screening and preventive services including:  Screening mammography  Colorectal cancer screening  Shingles vaccine. Prescription  given to that she can get the vaccine at the pharmacy or Medicare part D.  Screen + for depression. PHQ- 9 score of 12 (moderate depression). We discussed the options of counseling versus possibly a medication. I encouraged her strongly think about the counseling. She is going through some medical problems currently and her husband is as well Mrs. been very stressful for her. She says she will think about it. She does have Xanax to use as needed. Though she may benefit from an SSRI for her more depressive type symptoms but she wants to hold off at this time.  I aksed her  to please have her cardioloist send records since we have none on file.  Diet review for nutrition referral? Yes ____ Not Indicated __x__  Patient Instructions (the written plan) was given to the patient.  Medicare Attestation  I have personally reviewed:  The patient's medical and social history  Their use of alcohol, tobacco or illicit drugs  Their current medications and supplements  The patient's functional ability including ADLs,fall risks, home safety risks, cognitive, and hearing and visual impairment  Diet and physical activities  Evidence for depression or mood disorders  The patient's weight, height, BMI, and visual acuity have been recorded in the chart. I have made referrals, counseling, and provided education to the patient based on review of the above and I have provided the patient with a written personalized care plan for preventive services.        Review of Systems: Consitutional: No fever, chills, fatigue, night sweats, lymphadenopathy. No significant/unexplained weight changes. Eyes: No visual changes, eye redness, or discharge. ENT/Mouth: No ear pain, sore throat, nasal drainage, or sinus pain. Cardiovascular: No chest pressure,heaviness, tightness or squeezing, even with exertion. No increased shortness of breath or dyspnea on exertion.No palpitations, edema, orthopnea, PND. Respiratory: No cough, hemoptysis, SOB, or wheezing. Gastrointestinal: No anorexia, dysphagia, reflux, pain, nausea, vomiting, hematemesis, diarrhea, constipation, BRBPR, or melena. Breast: No mass, nodules, bulging, or retraction. No skin changes or inflammation. No nipple discharge. No lymphadenopathy. Genitourinary: No dysuria, hematuria, incontinence, vaginal discharge, pruritis, burning, abnormal bleeding, or pain. Musculoskeletal: No decreased ROM, No joint pain or swelling. No significant pain in neck, back, or extremities. Skin: No rash, pruritis, or concerning lesions. Neurological: No  headache, dizziness, syncope, seizures, tremors, memory loss, coordination problems, or paresthesias. Psychological: No anxiety, depression, hallucinations, SI/HI. Endocrine: No polydipsia, polyphagia, polyuria, or known diabetes.No increased fatigue. No palpitations/rapid heart rate. No significant/unexplained weight change. All other systems were reviewed and are otherwise negative.  Past Medical History  Diagnosis Date  . Cystocele   . Hypertension   . Depression   . History of Helicobacter pylori infection   . Vitamin D deficiency   . COPD (chronic obstructive pulmonary disease)      Past Surgical History  Procedure Laterality Date  . Cholecystectomy  05/2007  . Appendectomy    . Abdominal hysterectomy      And Removed 1 ovary    Home Meds:  Outpatient Prescriptions Prior to Visit  Medication Sig Dispense Refill  . amLODipine (NORVASC) 5 MG tablet Take 1 tablet (5 mg total) by mouth daily.  90 tablet  0  . estradiol (ESTRACE) 0.5 MG tablet TAKE 1 TABLET BY MOUTH EVERY DAY  30 tablet  0   No facility-administered medications prior to visit.    Allergies:  Allergies  Allergen Reactions  . Ace Inhibitors Rash    History   Social History  . Marital Status: Married  Spouse Name: N/A    Number of Children: N/A  . Years of Education: N/A   Occupational History  . Not on file.   Social History Main Topics  . Smoking status: Former Smoker -- 0.50 packs/day    Types: Cigarettes    Quit date: 10/30/2010  . Smokeless tobacco: Never Used  . Alcohol Use: No  . Drug Use: No  . Sexual Activity: Not on file   Other Topics Concern  . Not on file   Social History Narrative   Entered 07/2013:   Is retired. 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   Married.    Family History  Problem Relation Age of Onset  . Cancer Mother     colon  . Cancer Sister     ovarian  . Cancer Brother     melanoma  . Cancer Brother 74    colon cancer    Physical Exam: Blood  pressure 132/88, pulse 72, temperature 97.9 F (36.6 C), temperature source Oral, resp. rate 14, height 5\' 4"  (1.626 m), weight 207 lb (93.895 kg)., Body mass index is 35.51 kg/(m^2). General: [name]. Appears in no acute distress. HEENT: Normocephalic, atraumatic. Conjunctiva pink, sclera non-icteric. Pupils 2 mm constricting to 1 mm, round, regular, and equally reactive to light and accomodation. EOMI. Internal auditory canal clear. TMs with good cone of light and without pathology. Nasal mucosa pink. Nares are without discharge. No sinus tenderness. Oral mucosa pink.  Pharynx without exudate.   Neck: Supple. Trachea midline. No thyromegaly. Full ROM. No lymphadenopathy.No Carotid Bruits. Lungs: Mild wheezes throughout bilaterally. Cardiovascular: RRR with S1 S2. No murmurs, rubs, or gallops. Distal pulses 2+ symmetrically. No carotid or abdominal bruits. Breast: Symmetrical. No masses. Nipples without discharge. Abdomen: Soft, non-tender, non-distended with normoactive bowel sounds. No hepatosplenomegaly or masses. No rebound/guarding. No CVA tenderness. No hernias.  Genitourinary:  External genitalia without lesions. Vaginal mucosa pink.No discharge present.  No adnexal mass or tenderness.   Musculoskeletal: Full range of motion and 5/5 strength throughout. Without swelling, atrophy, tenderness, crepitus, or warmth. Extremities without clubbing, cyanosis, or edema.  Skin: Warm and moist without erythema, ecchymosis, wounds, or rash. Neuro: A+Ox3. CN II-XII grossly intact. Moves all extremities spontaneously. Full sensation throughout. Normal gait. DTR 2+ throughout upper and lower extremities. Finger to nose intact. Psych:  Responds to questions appropriately with a normal affect.   Assessment/Plan:  68 y.o. y/o female here for CPE  1. Visit for preventive health examination  A. Screening Labs: She recently came for screening labs on 08/25/13. CBC--normal Vitamin  D--normal at 46 Lipid  panel--normal. Triglyceride 146. HDL 45. LDL 89. TSH--slightly elevated. Free T4-- normal---- See #3 below. CMET--Normal Except Glucose  125 A1C ---6.2   B. Pap: She has history of hysterectomy and this was not performed secondary to cancer. Therefore she does not need further Pap smear. Pelvic exam normal today.  C. Screening Mammogram: - MM Digital Screening; Future She states her last mammogram was about 3 years ago. She is agreeable for me to schedule followup.  D. DEXA/BMD:  - DG Bone Density; Future She states that she has never had a bone density test. She is agreeable to have one performed when she goes for her mammogram.  E. Colorectal Cancer Screening: She states that she recently received a letter that she is due to schedule followup colonoscopy. States that prior one was performed by Dr. Collene Mares. She will call to schedule followup.  F. Immunizations:  Influenza: N/A Tetanus: She  has not had a tetanus vaccine in greater than 10 years. She is agreeable to receive this today.--Given 08/27/2013 Pneumococcal: She has never had a pneumonia vaccine. She is now > 48 y/o. GIVE Prevnar 13 today. 6 - 12 MONTHS LATER, WILL NEED TO GIVE PNEUMOVAX 23. Zostavax: Will discuss at next office visit. By the end of this office visit today, she was getting overwhelmed.  2. Hyperglycemia Today I discussed with her her elevated glucose level and her A1c level. I gave and reviewed carbohydrate handout. Discussed this in detail and discussed examples of foods to limit and foods that she can eat.  3. Subclinical hypothyroidism TSH slightly elevated a free T4 normal. Will need to monitor this and recheck lab in 6 months.  4. Essential hypertension Blood pressure is well controlled. Continue current medication. - amLODipine (NORVASC) 5 MG tablet; Take 1 tablet (5 mg total) by mouth daily.  Dispense: 90 tablet; Refill: 1  5. Hormone replacement therapy (postmenopausal) She states that she has tried  coming off of hormones multiple times but every time developed severe hot flashes and cannot stay off of them. She had been told that it would take her body a while to get used to it so one time she did state off of the hormones for a full 6 months but then she was having severe hot flashes even after being off of therapy for 6 months and had to restart. She is well aware of potential adverse effects of HRT. - estradiol (ESTRACE) 0.5 MG tablet; TAKE 1 TABLET BY MOUTH EVERY DAY  Dispense: 30 tablet; Refill: 5  6. Pulmonary emphysema, unspecified emphysema type - albuterol (PROVENTIL HFA;VENTOLIN HFA) 108 (90 BASE) MCG/ACT inhaler; Inhale 2 puffs into the lungs every 6 (six) hours as needed for wheezing or shortness of breath.  Dispense: 1 Inhaler; Refill: 0 - Fluticasone-Salmeterol (ADVAIR DISKUS) 250-50 MCG/DOSE AEPB; Inhale 1 puff into the lungs 2 (two) times daily.  Dispense: 1 each; Refill: 11  7. Diarrhea She states that ever since she had her gallbladder removed she has diarrhea about 45 minutes after eating a meal.  She says that she had discussed this with Dr. Dennard Schaumann in the past and he had told her to use Imodium prior to meals. She has been using Imodium but still 4 years later is still having the same problem. Says that in the past Dr. Dennard Schaumann told her that if the Imodium did not work then we could prescribe a different medicine. He is requesting that medication. - cholestyramine light (PREVALITE) 4 G packet; Take 1 packet (4 g total) by mouth 2 (two) times daily.  Dispense: 60 packet; Refill: 5  8. Acute bronchitis, unspecified organism She says that she has been sick for almost one week. Has thick dark mucus from her nose. Has cough with thick dark phlegm. Says that she has never had problems wheezing since she quit smoking until this week she has noticed some wheezing. - predniSONE (DELTASONE) 20 MG tablet; Take 2 daily for 5 days.  Dispense: 10 tablet; Refill: 0 - azithromycin  (ZITHROMAX) 250 MG tablet; Day 1: Take 2 daily.  Days 2-5: Take 1 daily.  Dispense: 6 tablet; Refill: 0 - albuterol (PROVENTIL HFA;VENTOLIN HFA) 108 (90 BASE) MCG/ACT inhaler; Inhale 2 puffs into the lungs every 6 (six) hours as needed for wheezing or shortness of breath.  Dispense: 1 Inhaler; Refill: 0  She is to schedule followup office visit in 3 months to recheck A1 C. Will need to  give Pneumovax 23 and 6-12 months. Will Need to discuss the Zostavax at next office visit.  Marin Olp [place], [place], [facility] 08/27/2013 2:24 PM

## 2013-08-28 ENCOUNTER — Encounter: Payer: Self-pay | Admitting: *Deleted

## 2013-09-09 ENCOUNTER — Other Ambulatory Visit: Payer: Self-pay | Admitting: Physician Assistant

## 2013-09-09 DIAGNOSIS — E2839 Other primary ovarian failure: Secondary | ICD-10-CM

## 2013-09-16 ENCOUNTER — Other Ambulatory Visit: Payer: Self-pay | Admitting: Physician Assistant

## 2013-09-16 DIAGNOSIS — Z1231 Encounter for screening mammogram for malignant neoplasm of breast: Secondary | ICD-10-CM

## 2013-09-25 ENCOUNTER — Ambulatory Visit
Admission: RE | Admit: 2013-09-25 | Discharge: 2013-09-25 | Disposition: A | Discharge status: Home / Self Care | Payer: Medicare Other | Source: Ambulatory Visit | Attending: Physician Assistant | Admitting: Physician Assistant

## 2013-09-25 ENCOUNTER — Other Ambulatory Visit: Payer: Self-pay

## 2013-09-25 DIAGNOSIS — Z1231 Encounter for screening mammogram for malignant neoplasm of breast: Secondary | ICD-10-CM

## 2013-09-28 NOTE — Progress Notes (Signed)
At her complete physical with me 08/27/13 she reported that her last mammogram was about 3 years ago. She was agreeable for me to schedule mammogram. However this is now showing up in my "Overdue Results" folder. Please call patient and ask her why she did not go for her mammogram and see if she wants [place] to reschedule.

## 2013-11-26 ENCOUNTER — Ambulatory Visit: Payer: Medicare Other | Admitting: Physician Assistant

## 2013-11-29 ENCOUNTER — Other Ambulatory Visit: Payer: Self-pay | Admitting: Family Medicine

## 2013-11-30 NOTE — Telephone Encounter (Signed)
Medication refilled per protocol. 

## 2013-12-03 ENCOUNTER — Ambulatory Visit (INDEPENDENT_AMBULATORY_CARE_PROVIDER_SITE_OTHER): Payer: Medicare Other | Admitting: Physician Assistant

## 2013-12-03 ENCOUNTER — Encounter: Payer: Self-pay | Admitting: Physician Assistant

## 2013-12-03 VITALS — BP 146/98 | HR 80 | Temp 98.7°F | Resp 18 | Wt 208.0 lb

## 2013-12-03 DIAGNOSIS — J439 Emphysema, unspecified: Secondary | ICD-10-CM

## 2013-12-03 DIAGNOSIS — E039 Hypothyroidism, unspecified: Secondary | ICD-10-CM

## 2013-12-03 DIAGNOSIS — E038 Other specified hypothyroidism: Secondary | ICD-10-CM

## 2013-12-03 DIAGNOSIS — I10 Essential (primary) hypertension: Secondary | ICD-10-CM

## 2013-12-03 DIAGNOSIS — J069 Acute upper respiratory infection, unspecified: Secondary | ICD-10-CM

## 2013-12-03 DIAGNOSIS — Z79890 Hormone replacement therapy (postmenopausal): Secondary | ICD-10-CM

## 2013-12-03 DIAGNOSIS — E559 Vitamin D deficiency, unspecified: Secondary | ICD-10-CM

## 2013-12-03 DIAGNOSIS — B9689 Other specified bacterial agents as the cause of diseases classified elsewhere: Secondary | ICD-10-CM

## 2013-12-03 DIAGNOSIS — R739 Hyperglycemia, unspecified: Secondary | ICD-10-CM

## 2013-12-03 LAB — HEMOGLOBIN A1C, FINGERSTICK: HEMOGLOBIN A1C, FINGERSTICK: 6 % — AB (ref <5.7)

## 2013-12-03 MED ORDER — AMOXICILLIN-POT CLAVULANATE 875-125 MG PO TABS: 1.0000 | ORAL_TABLET | Freq: Two times a day (BID) | ORAL | Status: DC

## 2013-12-03 NOTE — Progress Notes (Signed)
Patient ID: AVIV ROTA MRN: [id number], DOB: Mar 04, 1945, 68 y.o. Date of Encounter: 12/03/2013,   Chief Complaint: F/U Hyperglycemia. Also sick with congestion.   HPI: 68 y.o. y/o white female  here for above.   Last office visit with me was 08/27/13 for a Medicare physical/CPE.  She had labs done prior to that visit. Glucose was elevated so A1c was added. A1c was 6.2. She was given a handout regarding low carbohydrate diet and was told to follow-up in 3 months. Today she states that she has made some changes her diet but does admit that she has not been very strict with this. Asked what was probably one of the bigger things that she noticed and she said that she was doing a lot of snacking and was eating a lot of potatoes and has tried to improve these areas.  Today she reports that she has been having some sore throat headache and ear ache. Says that these especially bother her at night. In addition to all of the symptoms she also has cough at night and because of all of this she is not getting much sleep and then she feels very tired during the day. Says this has been going on for 6 days and is not getting any better. Blowing a lot of mucus out of her nose especially in the mornings.No chest congestion.   No other complaints today. Remainder of her review of systems is negative.  Past Medical History  Diagnosis Date  . Cystocele   . Hypertension   . Depression   . History of Helicobacter pylori infection   . Vitamin D deficiency   . COPD (chronic obstructive pulmonary disease)      Past Surgical History  Procedure Laterality Date  . Cholecystectomy  05/2007  . Appendectomy    . Abdominal hysterectomy      And Removed 1 ovary    Home Meds:  Outpatient Prescriptions Prior to Visit  Medication Sig Dispense Refill  . albuterol (PROVENTIL HFA;VENTOLIN HFA) 108 (90 BASE) MCG/ACT inhaler Inhale 2 puffs into the lungs every 6 (six) hours as needed for wheezing or shortness of  breath. 1 Inhaler 0  . amLODipine (NORVASC) 5 MG tablet TAKE 1 TABLET BY MOUTH EVERY DAY 90 tablet 0  . estradiol (ESTRACE) 0.5 MG tablet TAKE 1 TABLET BY MOUTH EVERY DAY 30 tablet 5  . Fluticasone-Salmeterol (ADVAIR DISKUS) 250-50 MCG/DOSE AEPB Inhale 1 puff into the lungs 2 (two) times daily. 1 each 11  . azithromycin (ZITHROMAX) 250 MG tablet Day 1: Take 2 daily.  Days 2-5: Take 1 daily. 6 tablet 0  . cholestyramine light (PREVALITE) 4 G packet Take 1 packet (4 g total) by mouth 2 (two) times daily. 60 packet 5  . predniSONE (DELTASONE) 20 MG tablet Take 2 daily for 5 days. 10 tablet 0   No facility-administered medications prior to visit.    Allergies:  Allergies  Allergen Reactions  . Prevalite [Cholestyramine Light] Hives  . Ace Inhibitors Rash    History   Social History  . Marital Status: Married    Spouse Name: N/A    Number of Children: N/A  . Years of Education: N/A   Occupational History  . Not on file.   Social History Main Topics  . Smoking status: Former Smoker -- 0.50 packs/day    Types: Cigarettes    Quit date: 10/30/2010  . Smokeless tobacco: Never Used  . Alcohol Use: No  . Drug Use: No  .  Sexual Activity: Not on file   Other Topics Concern  . Not on file   Social History Narrative   Entered 07/2013:   Is retired. 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   Married.    Family History  Problem Relation Age of Onset  . Cancer Mother     colon  . Cancer Sister     ovarian  . Cancer Brother     melanoma  . Cancer Brother 38    colon cancer    Physical Exam: Blood pressure 146/98, pulse 80, temperature 98.7 F (37.1 C), temperature source Oral, resp. rate 18, weight 208 lb (94.348 kg)., Body mass index is 35.69 kg/(m^2). General: [name]. Appears in no acute distress. HEENT: Normocephalic, atraumatic. Conjunctiva pink, sclera non-icteric. EOMI. Internal auditory canal clear. TMs with good cone of light and without pathology. Nasal mucosa pink.  Nares are without discharge. Minimal frontal and maxillary sinus tenderness. Oral mucosa pink.  Pharynx without exudate.   Neck: Supple. Trachea midline. No thyromegaly. Full ROM. No lymphadenopathy.No Carotid Bruits. Lungs: Mild wheezes throughout bilaterally. Cardiovascular: RRR with S1 S2. No murmurs, rubs, or gallops.  Musculoskeletal: Strength and tone normal for age. Skin: Warm and moist. Neuro: A+Ox3. CN II-XII grossly intact. Moves all extremities spontaneously. Normal gait.  Psych:  Responds to questions appropriately with a normal affect.   Assessment/Plan:  68 y.o. y/o female here for    Hyperglycemia - Hemoglobin A1C, fingerstick Continue to improve her diet regarding decreasing carbohydrates. Follow-up office visit in 3 months.   Bacterial upper respiratory infection - amoxicillin-clavulanate (AUGMENTIN) 875-125 MG per tablet; Take 1 tablet by mouth 2 (two) times daily.  Dispense: 20 tablet; Refill: 0 Take antibiotic as directed and complete all of it. Can continue to use over-the-counter medication for symptom relief as needed. Follow-up if symptoms do not resolve after completion of antibiotic.   THE FOLLOWING INFO IS FROM HER  CPE / OV NOTE DATED 08/27/2013 3. Subclinical hypothyroidism TSH slightly elevated a free T4 normal. Will need to monitor this and recheck lab in 6 months.  4. Essential hypertension Blood pressure is well controlled. Continue current medication. - amLODipine (NORVASC) 5 MG tablet; Take 1 tablet (5 mg total) by mouth daily.  Dispense: 90 tablet; Refill: 1  5. Hormone replacement therapy (postmenopausal) She states that she has tried coming off of hormones multiple times but every time developed severe hot flashes and cannot stay off of them. She had been told that it would take her body a while to get used to it so one time she did state off of the hormones for a full 6 months but then she was having severe hot flashes even after being off of  therapy for 6 months and had to restart. She is well aware of potential adverse effects of HRT. - estradiol (ESTRACE) 0.5 MG tablet; TAKE 1 TABLET BY MOUTH EVERY DAY  Dispense: 30 tablet; Refill: 5  6. Pulmonary emphysema, unspecified emphysema type - albuterol (PROVENTIL HFA;VENTOLIN HFA) 108 (90 BASE) MCG/ACT inhaler; Inhale 2 puffs into the lungs every 6 (six) hours as needed for wheezing or shortness of breath.  Dispense: 1 Inhaler; Refill: 0 - Fluticasone-Salmeterol (ADVAIR DISKUS) 250-50 MCG/DOSE AEPB; Inhale 1 puff into the lungs 2 (two) times daily.  Dispense: 1 each; Refill: 11  7. Diarrhea She states that ever since she had her gallbladder removed she has diarrhea about 45 minutes after eating a meal.  She says that she had discussed this with  Dr. Dennard Schaumann in the past and he had told her to use Imodium prior to meals. She has been using Imodium but still 4 years later is still having the same problem. Says that in the past Dr. Dennard Schaumann told her that if the Imodium did not work then we could prescribe a different medicine. He is requesting that medication. - cholestyramine light (PREVALITE) 4 G packet; Take 1 packet (4 g total) by mouth 2 (two) times daily.  Dispense: 60 packet; Refill: 5    preventive health examination  A. Screening Labs: She recently came for screening labs on 08/25/13. CBC--normal Vitamin  D--normal at 46 Lipid panel--normal. Triglyceride 146. HDL 45. LDL 89. TSH--slightly elevated. Free T4-- normal---- See #3 below. CMET--Normal Except Glucose  125 A1C ---6.2   B. Pap: She has history of hysterectomy and this was not performed secondary to cancer. Therefore she does not need further Pap smear. Pelvic exam normal today.  C. Screening Mammogram: - MM Digital Screening; Future She states her last mammogram was about 3 years ago. She is agreeable for me to schedule followup. At visit 12/03/13 patient reports that she did have a mammogram performed and it was  negative.  D. DEXA/BMD:  - DG Bone Density; Future She states that she has never had a bone density test. She is agreeable to have one performed when she goes for her mammogram. At visit 12/03/13 patient reports that she did not have the bone density scan performed. Says that she had diarrhea on the day that was scheduled so they had to cancel doing it that day and she has not rescheduled it.  E. Colorectal Cancer Screening: She states that she recently received a letter that she is due to schedule followup colonoscopy. States that prior one was performed by Dr. Collene Mares. She will call to schedule followup. At visit 12/03/13 patient reports that she "never got around to "calling to schedule this  and has "put it off." I encouraged her to call to schedule this.   F. Immunizations:  Influenza: At visit 12/03/13 she was interested in getting a flu vaccine but given her illness we have postponed this. She can return to get influenza vaccine once she is not sick. Tetanus: -Given here  08/27/2013 Pneumococcal:  Prevnar 13 given here 08/27/2013---- 6 - 12 MONTHS LATER, WILL NEED TO GIVE PNEUMOVAX 23.WILL NEED THIS AT NEXT OV--TOO SOON TO DO AT OV 11/2013. Zostavax: Will discuss at next office visit. By the end of this office visit today, she was getting overwhelmed.   She is to schedule followup office visit in 3 months to recheck A1 C. Will need to give Pneumovax 23  Will Need to discuss the Zostavax at next office visit.  Signed, [address] [place], [place], [facility] 12/03/2013 11:29 AM

## 2014-01-12 ENCOUNTER — Ambulatory Visit (INDEPENDENT_AMBULATORY_CARE_PROVIDER_SITE_OTHER): Payer: Medicare Other | Admitting: *Deleted

## 2014-01-12 DIAGNOSIS — Z23 Encounter for immunization: Secondary | ICD-10-CM

## 2014-03-20 ENCOUNTER — Other Ambulatory Visit: Payer: Self-pay | Admitting: Physician Assistant

## 2014-03-22 ENCOUNTER — Encounter: Payer: Self-pay | Admitting: Family Medicine

## 2014-03-22 NOTE — Telephone Encounter (Signed)
Medication refill for one time only.  Patient needs to be seen.  Letter sent for patient to call and schedule 

## 2014-04-21 ENCOUNTER — Other Ambulatory Visit: Payer: Self-pay | Admitting: Physician Assistant

## 2014-04-21 NOTE — Telephone Encounter (Signed)
Medication refilled per protocol. 

## 2014-07-12 ENCOUNTER — Ambulatory Visit (INDEPENDENT_AMBULATORY_CARE_PROVIDER_SITE_OTHER): Payer: Medicare Other | Admitting: Family Medicine

## 2014-07-12 ENCOUNTER — Encounter: Payer: Self-pay | Admitting: Family Medicine

## 2014-07-12 VITALS — BP 128/64 | HR 60 | Temp 98.1°F | Resp 18 | Ht 64.0 in | Wt 182.0 lb

## 2014-07-12 DIAGNOSIS — L282 Other prurigo: Secondary | ICD-10-CM

## 2014-07-12 MED ORDER — PREDNISONE 20 MG PO TABS
ORAL_TABLET | ORAL | Status: DC
Start: 2014-07-12 — End: 2014-08-23

## 2014-07-12 NOTE — Progress Notes (Signed)
Subjective:    Patient ID: Amanda Pope, female    DOB: 22-Dec-1945, 69 y.o.   MRN: [id number]  HPI Over the last 5 weeks, the patient has been getting erythematous urticarial papules on her legs and on her arms and on her neck. The papules are limited only to exposed areas of her skin. They last 3 or 4 days and then resolve spontaneously. They itch. On examination today she has to urticarial papules that are about 5 mm in diameter on her medial right elbow, 3 on her anterior right shin.  There appear to be insect bites like mosquito bites. There is no evidence of vasculitis. There is no rash on her chest on her abdomen or on her back. She denies any rash on her face or in her mouth. She denies any rash in her genital area Past Medical History  Diagnosis Date  . Cystocele   . Hypertension   . Depression   . History of Helicobacter pylori infection   . Vitamin D deficiency   . COPD (chronic obstructive pulmonary disease)    Past Surgical History  Procedure Laterality Date  . Cholecystectomy  05/2007  . Appendectomy    . Abdominal hysterectomy      And Removed 1 ovary   Current Outpatient Prescriptions on File Prior to Visit  Medication Sig Dispense Refill  . albuterol (PROVENTIL HFA;VENTOLIN HFA) 108 (90 BASE) MCG/ACT inhaler Inhale 2 puffs into the lungs every 6 (six) hours as needed for wheezing or shortness of breath. 1 Inhaler 0  . amLODipine (NORVASC) 5 MG tablet TAKE 1 TABLET BY MOUTH EVERY DAY 90 tablet 0  . estradiol (ESTRACE) 0.5 MG tablet TAKE 1 TABLET BY MOUTH EVERY DAY 30 tablet 4  . Fluticasone-Salmeterol (ADVAIR DISKUS) 250-50 MCG/DOSE AEPB Inhale 1 puff into the lungs 2 (two) times daily. 1 each 11   No current facility-administered medications on file prior to visit.   Allergies  Allergen Reactions  . Prevalite [Cholestyramine Light] Hives  . Ace Inhibitors Rash   History   Social History  . Marital Status: Married    Spouse Name: N/A  . Number of Children:  N/A  . Years of Education: N/A   Occupational History  . Not on file.   Social History Main Topics  . Smoking status: Former Smoker -- 0.50 packs/day    Types: Cigarettes    Quit date: 10/30/2010  . Smokeless tobacco: Never Used  . Alcohol Use: No  . Drug Use: No  . Sexual Activity: Not on file   Other Topics Concern  . Not on file   Social History Narrative   Entered 07/2013:   Is retired. 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   Married.      Review of Systems  All other systems reviewed and are negative.      Objective:   Physical Exam  Constitutional: She appears well-developed and well-nourished.  HENT:  Head: Normocephalic and atraumatic.  Nose: Nose normal.  Mouth/Throat: Oropharynx is clear and moist. No oropharyngeal exudate.  Eyes: Conjunctivae are normal. No scleral icterus.  Neck: Neck supple.  Cardiovascular: Normal rate, regular rhythm and normal heart sounds.  Exam reveals no gallop and no friction rub.   No murmur heard. Pulmonary/Chest: Effort normal and breath sounds normal. No respiratory distress. She has no wheezes. She has no rales.  Abdominal: Soft. Bowel sounds are normal. She exhibits no distension. There is no tenderness. There is no rebound and no guarding.  Musculoskeletal: She exhibits no edema.  Lymphadenopathy:    She has no cervical adenopathy.  Vitals reviewed.         Assessment & Plan:  Papular urticaria - Plan: predniSONE (DELTASONE) 20 MG tablet  Patient has papillary urticaria from insect bites. I will give her prednisone to calm the itchy rash now. I recommended she wear insect repellent twice a day. I recommended she wear insect repellent that has DEET and I believe this. These bumps from occurring

## 2014-08-19 ENCOUNTER — Other Ambulatory Visit: Payer: Medicare Other

## 2014-08-19 ENCOUNTER — Other Ambulatory Visit: Payer: Self-pay | Admitting: Physician Assistant

## 2014-08-19 DIAGNOSIS — R739 Hyperglycemia, unspecified: Secondary | ICD-10-CM

## 2014-08-19 DIAGNOSIS — I10 Essential (primary) hypertension: Secondary | ICD-10-CM

## 2014-08-19 DIAGNOSIS — E559 Vitamin D deficiency, unspecified: Secondary | ICD-10-CM

## 2014-08-19 DIAGNOSIS — F32A Depression, unspecified: Secondary | ICD-10-CM

## 2014-08-19 DIAGNOSIS — Z79899 Other long term (current) drug therapy: Secondary | ICD-10-CM

## 2014-08-19 DIAGNOSIS — Z0000 Encounter for general adult medical examination without abnormal findings: Secondary | ICD-10-CM

## 2014-08-19 DIAGNOSIS — F329 Major depressive disorder, single episode, unspecified: Secondary | ICD-10-CM

## 2014-08-19 LAB — CBC WITH DIFFERENTIAL/PLATELET
BASOS ABS: 0 10*3/uL (ref 0.0–0.1)
Basophils Relative: 0 % (ref 0–1)
EOS ABS: 1.5 10*3/uL — AB (ref 0.0–0.7)
Eosinophils Relative: 12 % — ABNORMAL HIGH (ref 0–5)
HEMATOCRIT: 44.3 % (ref 36.0–46.0)
Hemoglobin: 14.4 g/dL (ref 12.0–15.0)
Lymphocytes Relative: 27 % (ref 12–46)
Lymphs Abs: 3.4 10*3/uL (ref 0.7–4.0)
MCH: 29.5 pg (ref 26.0–34.0)
MCHC: 32.5 g/dL (ref 30.0–36.0)
MCV: 90.8 fL (ref 78.0–100.0)
MPV: 9.6 fL (ref 8.6–12.4)
Monocytes Absolute: 0.8 10*3/uL (ref 0.1–1.0)
Monocytes Relative: 6 % (ref 3–12)
NEUTROS ABS: 6.9 10*3/uL (ref 1.7–7.7)
NEUTROS PCT: 55 % (ref 43–77)
Platelets: 310 10*3/uL (ref 150–400)
RBC: 4.88 MIL/uL (ref 3.87–5.11)
RDW: 13.3 % (ref 11.5–15.5)
WBC: 12.6 10*3/uL — ABNORMAL HIGH (ref 4.0–10.5)

## 2014-08-19 LAB — TSH: TSH: 4.935 u[IU]/mL — ABNORMAL HIGH (ref 0.350–4.500)

## 2014-08-20 LAB — COMPLETE METABOLIC PANEL WITH GFR
ALT: 10 U/L (ref 0–35)
AST: 12 U/L (ref 0–37)
Albumin: 3.8 g/dL (ref 3.5–5.2)
Alkaline Phosphatase: 95 U/L (ref 39–117)
BUN: 13 mg/dL (ref 6–23)
CO2: 25 mEq/L (ref 19–32)
Calcium: 9.4 mg/dL (ref 8.4–10.5)
Chloride: 105 mEq/L (ref 96–112)
Creat: 0.57 mg/dL (ref 0.50–1.10)
GFR, Est African American: >89 mL/min
GFR, Est Non African American: >89 mL/min
Glucose, Bld: 98 mg/dL (ref 70–99)
Potassium: 4.3 mEq/L (ref 3.5–5.3)
Sodium: 144 mEq/L (ref 135–145)
Total Bilirubin: 0.8 mg/dL (ref 0.2–1.2)
Total Protein: 6.9 g/dL (ref 6.0–8.3)

## 2014-08-20 LAB — VITAMIN D 25 HYDROXY (VIT D DEFICIENCY, FRACTURES): VIT D 25 HYDROXY: 21 ng/mL — AB (ref 30–100)

## 2014-08-20 LAB — LIPID PANEL
Cholesterol: 186 mg/dL (ref 0–200)
HDL: 43 mg/dL — ABNORMAL LOW (ref >=46)
LDL Cholesterol: 111 mg/dL — ABNORMAL HIGH (ref 0–99)
Total CHOL/HDL Ratio: 4.3 Ratio
Triglycerides: 158 mg/dL — ABNORMAL HIGH (ref <150)
VLDL: 32 mg/dL (ref 0–40)

## 2014-08-20 LAB — HEMOGLOBIN A1C
Hgb A1c MFr Bld: 5.8 % — ABNORMAL HIGH (ref <5.7)
Mean Plasma Glucose: 120 mg/dL — ABNORMAL HIGH (ref <117)

## 2014-08-23 ENCOUNTER — Encounter: Payer: Self-pay | Admitting: Physician Assistant

## 2014-08-23 ENCOUNTER — Ambulatory Visit (INDEPENDENT_AMBULATORY_CARE_PROVIDER_SITE_OTHER): Payer: Medicare Other | Admitting: Physician Assistant

## 2014-08-23 VITALS — BP 110/72 | HR 78 | Temp 97.9°F | Resp 18 | Ht 63.5 in | Wt 176.0 lb

## 2014-08-23 DIAGNOSIS — Z79890 Hormone replacement therapy (postmenopausal): Secondary | ICD-10-CM

## 2014-08-23 DIAGNOSIS — E559 Vitamin D deficiency, unspecified: Secondary | ICD-10-CM

## 2014-08-23 DIAGNOSIS — I10 Essential (primary) hypertension: Secondary | ICD-10-CM | POA: Diagnosis not present

## 2014-08-23 DIAGNOSIS — F329 Major depressive disorder, single episode, unspecified: Secondary | ICD-10-CM

## 2014-08-23 DIAGNOSIS — Z0000 Encounter for general adult medical examination without abnormal findings: Secondary | ICD-10-CM | POA: Diagnosis not present

## 2014-08-23 DIAGNOSIS — E039 Hypothyroidism, unspecified: Secondary | ICD-10-CM

## 2014-08-23 DIAGNOSIS — J209 Acute bronchitis, unspecified: Secondary | ICD-10-CM

## 2014-08-23 DIAGNOSIS — Z8619 Personal history of other infectious and parasitic diseases: Secondary | ICD-10-CM

## 2014-08-23 DIAGNOSIS — E038 Other specified hypothyroidism: Secondary | ICD-10-CM

## 2014-08-23 DIAGNOSIS — J439 Emphysema, unspecified: Secondary | ICD-10-CM | POA: Diagnosis not present

## 2014-08-23 DIAGNOSIS — R739 Hyperglycemia, unspecified: Secondary | ICD-10-CM | POA: Diagnosis not present

## 2014-08-23 DIAGNOSIS — F32A Depression, unspecified: Secondary | ICD-10-CM

## 2014-08-23 LAB — T4, FREE: Free T4: 0.83 ng/dL (ref 0.80–1.80)

## 2014-08-23 MED ORDER — CALCIUM GLUCONATE 500 MG PO TABS: 1.0000 | ORAL_TABLET | Freq: Three times a day (TID) | ORAL | Status: AC

## 2014-08-23 MED ORDER — AZITHROMYCIN 250 MG PO TABS: ORAL_TABLET | ORAL | Status: DC

## 2014-08-23 MED ORDER — PREDNISONE 20 MG PO TABS: ORAL_TABLET | ORAL | Status: DC

## 2014-08-23 MED ORDER — VITAMIN D (CHOLECALCIFEROL) 25 MCG (1000 UT) PO CAPS: 1.0000 | ORAL_CAPSULE | Freq: Every day | ORAL | Status: DC

## 2014-08-23 NOTE — Progress Notes (Signed)
Patient ID: Amanda Pope MRN: [id number], DOB: 1945-06-23, 69 y.o. Date of Encounter: 08/23/2014,   Chief Complaint: Physical (CPE)  HPI: 69 y.o. y/o female  here for CPE.  She reports that this is day #5 of Chest congestion. She is getting some mucus from her nose but mostly it is her chest. Says her husband has had similar symptoms resolve his doctor last week.  No sore throat. No ear ache. No fevers or chills.   She states that she sees no other types of doctors or specialist or any other medical providers.   Subjective:   Patient presents for Medicare Annual/Subsequent preventive examination.   Review Past Medical/Family/Social: These are all documented below in each of these sections.   Risk Factors  Current exercise habits:  She keeps 36-year-old great-grandchild and chases him around the house. Dietary issues discussed: At [place] 07/2013-- I gave and reviewed low carbohydrate handout sheet at length. In addition to discussing specific foods to limit, possibly discuss specific foods she can and should eat.   At [place] 07/2014--- she reports that she is doing weight watchers. She has lost 30 pounds. Says that she has been doing this the past 5 months.  Cardiac risk factors: Obesity, HTN, Past Tobacco Use  Depression Screen  (Note: if answer to either of the following is "Yes", a more complete depression screening is indicated)  Over the past two weeks, have you felt down, depressed or hopeless? No Over the past two weeks, have you felt little interest or pleasure in doing things? No Have you lost interest or pleasure in daily life? No Do you often feel hopeless? No Do you cry easily over simple problems? No   Activities of Daily Living  In your present state of health, do you have any difficulty performing the following activities?:  Driving? No  Managing money? No  Feeding yourself? No  Getting from bed to chair? No  Climbing a flight of stairs? No  Preparing food and  eating?: No  Bathing or showering? No  Getting dressed: No  Getting to the toilet? No  Using the toilet:No  Moving around from place to place: No  In the past year have you fallen or had a near fall?:No  Are you sexually active? No  Do you have more than one partner? No   Hearing Difficulties: No  Do you often ask people to speak up or repeat themselves? No  Do you experience ringing or noises in your ears? No Do you have difficulty understanding soft or whispered voices? No  Do you feel that you have a problem with memory? No Do you often misplace items? No  Do you feel safe at home? Yes  Cognitive Testing  Alert? Yes Normal Appearance?Yes  Oriented to person? Yes Place? Yes  Time? Yes  Recall of three objects? Yes  Can perform simple calculations? Yes  Displays appropriate judgment?Yes  Can read the correct time from a watch face?Yes   List the Names of Other Physician/Practitioners you currently use: None   Indicate any recent Medical Services you may have received from other than [facility] providers in the past year (date may be approximate).   Screening Tests / Date---   Information regarding each of these is listed under the assessment and plan section at the end of the note. Colonoscopy                     Zostavax  Mammogram  Influenza Vaccine  Tetanus/tdap    Assessment:    Annual wellness medicare exam   Plan:    During the course of the visit the patient was educated and counseled about appropriate screening and preventive services including:  Screening mammography  Colorectal cancer screening  Shingles vaccine. Prescription given to that she can get the vaccine at the pharmacy or Medicare part D.  Screen + for depression. PHQ- 9 score of 12 (moderate depression). We discussed the options of counseling versus possibly a medication. I encouraged her strongly think about the counseling. She is going through some medical problems currently and her husband is as  well Mrs. been very stressful for her. She says she will think about it. She does have Xanax to use as needed. Though she may benefit from an SSRI for her more depressive type symptoms but she wants to hold off at this time.  I aksed her to please have her cardioloist send records since we have none on file.  Diet review for nutrition referral? Yes ____ Not Indicated __x__  Patient Instructions (the written plan) was given to the patient.  Medicare Attestation  I have personally reviewed:  The patient's medical and social history  Their use of alcohol, tobacco or illicit drugs  Their current medications and supplements  The patient's functional ability including ADLs,fall risks, home safety risks, cognitive, and hearing and visual impairment  Diet and physical activities  Evidence for depression or mood disorders  The patient's weight, height, BMI, and visual acuity have been recorded in the chart. I have made referrals, counseling, and provided education to the patient based on review of the above and I have provided the patient with a written personalized care plan for preventive services.        Review of Systems: Consitutional: No fever, chills, fatigue, night sweats, lymphadenopathy. No significant/unexplained weight changes. Eyes: No visual changes, eye redness, or discharge. ENT/Mouth: No ear pain, sore throat, nasal drainage, or sinus pain. Cardiovascular: No chest pressure,heaviness, tightness or squeezing, even with exertion. No increased shortness of breath or dyspnea on exertion.No palpitations, edema, orthopnea, PND. Respiratory: No cough, hemoptysis, SOB, or wheezing. Gastrointestinal: No anorexia, dysphagia, reflux, pain, nausea, vomiting, hematemesis, diarrhea, constipation, BRBPR, or melena. Breast: No mass, nodules, bulging, or retraction. No skin changes or inflammation. No nipple discharge. No lymphadenopathy. Genitourinary: No dysuria, hematuria, incontinence, vaginal  discharge, pruritis, burning, abnormal bleeding, or pain. Musculoskeletal: No decreased ROM, No joint pain or swelling. No significant pain in neck, back, or extremities. Skin: No rash, pruritis, or concerning lesions. Neurological: No headache, dizziness, syncope, seizures, tremors, memory loss, coordination problems, or paresthesias. Psychological: No anxiety, depression, hallucinations, SI/HI. Endocrine: No polydipsia, polyphagia, polyuria, or known diabetes.No increased fatigue. No palpitations/rapid heart rate. No significant/unexplained weight change. All other systems were reviewed and are otherwise negative.  Past Medical History  Diagnosis Date  . Cystocele   . Hypertension   . Depression   . History of Helicobacter pylori infection   . Vitamin D deficiency   . COPD (chronic obstructive pulmonary disease)      Past Surgical History  Procedure Laterality Date  . Cholecystectomy  05/2007  . Appendectomy    . Abdominal hysterectomy      And Removed 1 ovary    Home Meds:  Outpatient Prescriptions Prior to Visit  Medication Sig Dispense Refill  . albuterol (PROVENTIL HFA;VENTOLIN HFA) 108 (90 BASE) MCG/ACT inhaler Inhale 2 puffs into the lungs every 6 (six) hours as needed for wheezing or shortness  of breath. 1 Inhaler 0  . amLODipine (NORVASC) 5 MG tablet TAKE 1 TABLET BY MOUTH EVERY DAY 90 tablet 0  . estradiol (ESTRACE) 0.5 MG tablet TAKE 1 TABLET BY MOUTH EVERY DAY 30 tablet 4  . Fluticasone-Salmeterol (ADVAIR DISKUS) 250-50 MCG/DOSE AEPB Inhale 1 puff into the lungs 2 (two) times daily. 1 each 11  . predniSONE (DELTASONE) 20 MG tablet 3 tabs poqday 1-2, 2 tabs poqday 3-4, 1 tab poqday 5-6 (Patient not taking: Reported on 08/23/2014) 12 tablet 0   No facility-administered medications prior to visit.    Allergies:  Allergies  Allergen Reactions  . Prevalite [Cholestyramine Light] Hives  . Ace Inhibitors Rash    History   Social History  . Marital Status: Married      Spouse Name: N/A  . Number of Children: N/A  . Years of Education: N/A   Occupational History  . Not on file.   Social History Main Topics  . Smoking status: Former Smoker -- 0.50 packs/day    Types: Cigarettes    Quit date: 10/30/2010  . Smokeless tobacco: Never Used  . Alcohol Use: No  . Drug Use: No  . Sexual Activity: Not on file   Other Topics Concern  . Not on file   Social History Narrative   Entered 07/2013:   Is retired. 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   Married.    Family History  Problem Relation Age of Onset  . Cancer Mother     colon  . Cancer Sister     ovarian  . Cancer Brother     melanoma  . Cancer Brother 69    colon cancer    Physical Exam: Blood pressure 110/72, pulse 78, temperature 97.9 F (36.6 C), temperature source Oral, resp. rate 18, height 5' 3.5" (1.613 m), weight 176 lb (79.833 kg)., Body mass index is 30.68 kg/(m^2). General: [name]. Appears in no acute distress. HEENT: Normocephalic, atraumatic. Conjunctiva pink, sclera non-icteric. Pupils 2 mm constricting to 1 mm, round, regular, and equally reactive to light and accomodation. EOMI. Internal auditory canal clear. TMs with good cone of light and without pathology. Nasal mucosa pink. Nares are without discharge. No sinus tenderness. Oral mucosa pink.  Pharynx without exudate.   Neck: Supple. Trachea midline. No thyromegaly. Full ROM. No lymphadenopathy.No Carotid Bruits. Lungs: Mild wheezes throughout bilaterally.Harsh breath sounds/wheezes at bases bilaterally.  Cardiovascular: RRR with S1 S2. No murmurs, rubs, or gallops. Distal pulses 2+ symmetrically. No carotid or abdominal bruits. Breast: Symmetrical. No masses. Nipples without discharge. Abdomen: Soft, non-tender, non-distended with normoactive bowel sounds. No hepatosplenomegaly or masses. No rebound/guarding. No CVA tenderness. No hernias.  Genitourinary:  External genitalia without lesions. Vaginal mucosa pink.No  discharge present.  No adnexal mass or tenderness. H/O Hysterectomy. Exam c/w this.   Musculoskeletal: Full range of motion and 5/5 strength throughout. Without swelling, atrophy, tenderness, crepitus, or warmth. Extremities without clubbing, cyanosis, or edema.  Skin: Warm and moist without erythema, ecchymosis, wounds, or rash. Neuro: A+Ox3. CN II-XII grossly intact. Moves all extremities spontaneously. Full sensation throughout. Normal gait. DTR 2+ throughout upper and lower extremities. Finger to nose intact. Psych:  Responds to questions appropriately with a normal affect.   Assessment/Plan:  69 y.o. y/o female here for CPE  1. Visit for preventive health examination  A. Screening Labs: She recently came for screening labs. Free T4 added.  Vitamin D low--see below.  Other labs good.    B. Pap: She has history of  hysterectomy and this was not performed secondary to cancer. Therefore she does not need further Pap smear. Pelvic exam normal today.  C. Screening Mammogram: Mammogram 09/25/2013 negative. Reminded her that this will be due soon and needs to be done after August 28.  D. DEXA/BMD:  - DG Bone Density; Future At [facility] 07/2013--She stated that she had never had a bone density test. She was agreeable to have one performed so I ordered this.  However at follow-up 07/2014 I reviewed that it does not look like she ever had this. She states that the day she went for this she had diarrhea so they did not perform this. Never rescheduled it. 08/23/2014--Today I have placed another order for this to be scheduled again.  E. Colorectal Cancer Screening: At [facility]PE 07/2013---She stated that she recently received a letter that she is due to schedule followup colonoscopy. States that prior one was performed by Dr. Collene Mares. She will call to schedule followup. At [facility] 07/2014--- she says that she never did call to schedule the colonoscopy. Reminded her to do so today.  F. Immunizations:  Influenza:  N/A---July Tetanus: ----Given 08/27/2013 Pneumococcal: Prevnar 13--Given here 07/2013.                     --- PNEUMOVAX 23-----AT [facility] 07/2014 DISCUSSED THIS IS DUE, BUT GIVEN HER CURRENT ILLNESS--NEED TO TREAT THIS AND SHE IS TO RETURN FOR THIS VACCINE AFTER SHE                                         COMPLETES TREATMENT AND AFTER COUGH RESOLVES Zostavax: Will discuss at next office visit. By the end of this office visit today, she was getting overwhelmed.  2. Hyperglycemia At [facility] 07/2013---- I discussed with her her elevated glucose level and her A1c level. -----------------------I gave and reviewed carbohydrate handout. Discussed this in detail and discussed examples of foods to limit and foods that she can eat. At [place] 07/2014 she has lost >30 pounds over past 1 year-----glucose now normal.   3. Subclinical hypothyroidism TSH slightly elevated.  Free T4 added to lab.   4. Essential hypertension Blood pressure is well controlled. Continue current medication. - amLODipine (NORVASC) 5 MG tablet; Take 1 tablet (5 mg total) by mouth daily.  Dispense: 90 tablet; Refill: 1  5. Hormone replacement therapy (postmenopausal) She states that she has tried coming off of hormones multiple times but every time developed severe hot flashes and cannot stay off of them. She had been told that it would take her body a while to get used to it so one time she did state off of the hormones for a full 6 months but then she was having severe hot flashes even after being off of therapy for 6 months and had to restart. She is well aware of potential adverse effects of HRT. - estradiol (ESTRACE) 0.5 MG tablet; TAKE 1 TABLET BY MOUTH EVERY DAY  Dispense: 30 tablet; Refill: 5  6. Pulmonary emphysema, unspecified emphysema type - albuterol (PROVENTIL HFA;VENTOLIN HFA) 108 (90 BASE) MCG/ACT inhaler; Inhale 2 puffs into the lungs every 6 (six) hours as needed for wheezing or shortness of breath.  Dispense: 1 Inhaler; Refill:  0 - Fluticasone-Salmeterol (ADVAIR DISKUS) 250-50 MCG/DOSE AEPB; Inhale 1 puff into the lungs 2 (two) times daily.  Dispense: 1 each; Refill: 11  7. Diarrhea The following is copied  from 07/2013 OV Note:    "She states that ever since she had her gallbladder removed she has diarrhea about 45 minutes after eating a meal.  She says that she had discussed this with Dr. Dennard Schaumann in the past and he had told her to use Imodium prior to meals. She has been using Imodium but still 4 years later is still having the same problem. Says that in the past Dr. Dennard Schaumann told her that if the Imodium did not work then we could prescribe a different medicine. He is requesting that medication. - cholestyramine light (PREVALITE) 4 G packet; Take 1 packet (4 g total) by mouth 2 (two) times daily.  Dispense: 60 packet; Refill: 5"  8. Acute bronchitis, unspecified organism - predniSONE (DELTASONE) 20 MG tablet; Take 2 daily for 5 days.  Dispense: 10 tablet; Refill: 0 - azithromycin (ZITHROMAX) 250 MG tablet; Day 1: Take 2 daily.  Days 2-5: Take 1 daily.  Dispense: 6 tablet; Refill: 0 - albuterol (PROVENTIL HFA;VENTOLIN HFA) 108 (90 BASE) MCG/ACT inhaler; Inhale 2 puffs into the lungs every 6 (six) hours as needed for wheezing or shortness of breath.  Dispense: 1 Inhaler; Refill: 0  She is to return for Pneumovax 23 once this infection resolves.  ROV 6 months.   Marin Olp Delia, [place], [facility] 08/23/2014 2:38 PM

## 2014-09-30 ENCOUNTER — Encounter: Payer: Self-pay | Admitting: Family Medicine

## 2014-09-30 ENCOUNTER — Ambulatory Visit (INDEPENDENT_AMBULATORY_CARE_PROVIDER_SITE_OTHER): Payer: Medicare Other | Admitting: Family Medicine

## 2014-09-30 VITALS — BP 126/70 | HR 74 | Temp 98.2°F | Resp 16 | Ht 64.0 in | Wt 178.0 lb

## 2014-09-30 DIAGNOSIS — J209 Acute bronchitis, unspecified: Secondary | ICD-10-CM | POA: Diagnosis not present

## 2014-09-30 DIAGNOSIS — J45901 Unspecified asthma with (acute) exacerbation: Secondary | ICD-10-CM | POA: Diagnosis not present

## 2014-09-30 DIAGNOSIS — L282 Other prurigo: Secondary | ICD-10-CM

## 2014-09-30 DIAGNOSIS — J439 Emphysema, unspecified: Secondary | ICD-10-CM

## 2014-09-30 MED ORDER — ALBUTEROL SULFATE HFA 108 (90 BASE) MCG/ACT IN AERS: 2.0000 | INHALATION_SPRAY | Freq: Four times a day (QID) | RESPIRATORY_TRACT | Status: DC | PRN

## 2014-09-30 MED ORDER — LEVOCETIRIZINE DIHYDROCHLORIDE 5 MG PO TABS: 5.0000 mg | ORAL_TABLET | Freq: Every evening | ORAL | Status: DC

## 2014-09-30 MED ORDER — PREDNISONE 20 MG PO TABS: 60.0000 mg | ORAL_TABLET | Freq: Every day | ORAL | Status: DC

## 2014-09-30 NOTE — Progress Notes (Signed)
Subjective:    Patient ID: Amanda Pope, female    DOB: [birth year], 69 y.o.   MRN: [id number]  Urticaria  07/12/14 Over the last 5 weeks, the patient has been getting erythematous urticarial papules on her legs and on her arms and on her neck. The papules are limited only to exposed areas of her skin. They last 3 or 4 days and then resolve spontaneously. They itch. On examination today she has to urticarial papules that are about 5 mm in diameter on her medial right elbow, 3 on her anterior right shin.  There appear to be insect bites like mosquito bites. There is no evidence of vasculitis. There is no rash on her chest on her abdomen or on her back. She denies any rash on her face or in her mouth. She denies any rash in her genital area.  At that time, my plan was: Patient has papillary urticaria from insect bites. I will give her prednisone to calm the itchy rash now. I recommended she wear insect repellent twice a day. I recommended she wear insect repellent that has DEET and I believe this. These bumps from occurring  09/30/14 Patient continues to break out in urticarial papules and plaques on her neck on her arms on her legs episodically throughout the summer. They come and go. There is no specific trigger. They're extremely itchy. On examination today she has numerous 5 mm to 10 mm erythematous urticarial papules and in several coalescent plaques on her arms bilaterally and on the posterior aspect of her neck. She also is having increased trouble breathing for the last few days. She is wheezing today on exam. She has a prolonged expiratory phase, expiratory wheezing, increasing cough, chest congestion and shortness of breath. Past Medical History  Diagnosis Date  . Cystocele   . Hypertension   . Depression   . History of Helicobacter pylori infection   . Vitamin D deficiency   . COPD (chronic obstructive pulmonary disease)    Past Surgical History  Procedure Laterality Date  .  Cholecystectomy  05/2007  . Appendectomy    . Abdominal hysterectomy      And Removed 1 ovary   Current Outpatient Prescriptions on File Prior to Visit  Medication Sig Dispense Refill  . amLODipine (NORVASC) 5 MG tablet TAKE 1 TABLET BY MOUTH EVERY DAY 90 tablet 0  . calcium gluconate 500 MG tablet Take 1 tablet (500 mg total) by mouth 3 (three) times daily. 30 tablet 11  . estradiol (ESTRACE) 0.5 MG tablet TAKE 1 TABLET BY MOUTH EVERY DAY 30 tablet 4  . Fluticasone-Salmeterol (ADVAIR DISKUS) 250-50 MCG/DOSE AEPB Inhale 1 puff into the lungs 2 (two) times daily. 1 each 11  . Vitamin D, Cholecalciferol, 1000 UNITS CAPS Take 1 capsule by mouth daily. 30 capsule 11   No current facility-administered medications on file prior to visit.   Allergies  Allergen Reactions  . Prevalite [Cholestyramine Light] Hives  . Ace Inhibitors Rash   Social History   Social History  . Marital Status: Married    Spouse Name: N/A  . Number of Children: N/A  . Years of Education: N/A   Occupational History  . Not on file.   Social History Main Topics  . Smoking status: Former Smoker -- 0.50 packs/day    Types: Cigarettes    Quit date: 10/30/2010  . Smokeless tobacco: Never Used  . Alcohol Use: No  . Drug Use: No  . Sexual Activity: Not on file  Other Topics Concern  . Not on file   Social History Narrative   Entered 07/2013:   Is retired. 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 Loves it.   Married.      Review of Systems  All other systems reviewed and are negative.      Objective:   Physical Exam  Constitutional: She appears well-developed and well-nourished.  HENT:  Head: Normocephalic and atraumatic.  Nose: Nose normal.  Mouth/Throat: Oropharynx is clear and moist. No oropharyngeal exudate.  Eyes: Conjunctivae are normal. No scleral icterus.  Neck: Neck supple.  Cardiovascular: Normal rate, regular rhythm and normal heart sounds.  Exam reveals no gallop and no friction rub.   No murmur  heard. Pulmonary/Chest: Effort normal and breath sounds normal. No respiratory distress. She has no wheezes. She has no rales.  Abdominal: Soft. Bowel sounds are normal. She exhibits no distension. There is no tenderness. There is no rebound and no guarding.  Musculoskeletal: She exhibits no edema.  Lymphadenopathy:    She has no cervical adenopathy.  Vitals reviewed.         Assessment & Plan:  Pulmonary emphysema, unspecified emphysema type - Plan: albuterol (PROVENTIL HFA;VENTOLIN HFA) 108 (90 BASE) MCG/ACT inhaler  Acute bronchitis, unspecified organism - Plan: albuterol (PROVENTIL HFA;VENTOLIN HFA) 108 (90 BASE) MCG/ACT inhaler  Chronic papular urticaria - Plan: levocetirizine (XYZAL) 5 MG tablet  Asthma attack - Plan: predniSONE (DELTASONE) 20 MG tablet, albuterol (PROVENTIL HFA;VENTOLIN HFA) 108 (90 BASE) MCG/ACT inhaler  I will treat the patient like an asthma attack with prednisone 60 mg by mouth daily for 6 days. She is to use albuterol 2 puffs inhaled every 4 hours as needed for wheezing. I believe she has chronic papular urticaria which I will treat with Xyzal 5 mg by mouth daily if rash does not improve, I would recommend blood work to rule out vasculitis including an ESR, Anka, ANA. I will also perform a punch biopsy to confirm that these are urticarial. If lab work is normal in punch biopsy confirms urticaria, I recommend referral to an allergist.

## 2014-10-04 ENCOUNTER — Other Ambulatory Visit: Payer: Self-pay | Admitting: Physician Assistant

## 2014-10-05 NOTE — Telephone Encounter (Signed)
Medication refilled per protocol. 

## 2014-10-15 ENCOUNTER — Ambulatory Visit (INDEPENDENT_AMBULATORY_CARE_PROVIDER_SITE_OTHER): Payer: Medicare Other | Admitting: Family Medicine

## 2014-10-15 ENCOUNTER — Encounter: Payer: Self-pay | Admitting: Family Medicine

## 2014-10-15 VITALS — BP 128/82 | HR 84 | Temp 98.5°F | Resp 16 | Ht 64.0 in | Wt 179.0 lb

## 2014-10-15 DIAGNOSIS — L309 Dermatitis, unspecified: Secondary | ICD-10-CM

## 2014-10-15 NOTE — Progress Notes (Signed)
Subjective:    Patient ID: Amanda Pope, female    DOB: 03/04/1945, 69 y.o.   MRN: [id number]  HPI 07/12/14 Over the last 5 weeks, the patient has been getting erythematous urticarial papules on her legs and on her arms and on her neck. The papules are limited only to exposed areas of her skin. They last 3 or 4 days and then resolve spontaneously. They itch. On examination today she has to urticarial papules that are about 5 mm in diameter on her medial right elbow, 3 on her anterior right shin.  There appear to be insect bites like mosquito bites. There is no evidence of vasculitis. There is no rash on her chest on her abdomen or on her back. She denies any rash on her face or in her mouth. She denies any rash in her genital area.  At that time, my plan was: Patient has papillary urticaria from insect bites. I will give her prednisone to calm the itchy rash now. I recommended she wear insect repellent twice a day. I recommended she wear insect repellent that has DEET and I believe this. These bumps from occurring  09/30/14 Patient continues to break out in urticarial papules and plaques on her neck on her arms on her legs episodically throughout the summer. They come and go. There is no specific trigger. They're extremely itchy. On examination today she has numerous 5 mm to 10 mm erythematous urticarial papules and in several coalescent plaques on her arms bilaterally and on the posterior aspect of her neck. She also is having increased trouble breathing for the last few days. She is wheezing today on exam. She has a prolonged expiratory phase, expiratory wheezing, increasing cough, chest congestion and shortness of breath.  At that time, my plan was: I will treat the patient like an asthma attack with prednisone 60 mg by mouth daily for 6 days. She is to use albuterol 2 puffs inhaled every 4 hours as needed for wheezing. I believe she has chronic papular urticaria which I will treat with Xyzal 5 mg by  mouth daily if rash does not improve, I would recommend blood work to rule out vasculitis including an ESR, Anca, ANA. I will also perform a punch biopsy to confirm that these are urticarial. If lab work is normal in punch biopsy confirms urticaria, I recommend referral to an allergist.  10/15/14 Rash responded temporarily on prednisone but returned on both arms immediately as soon as she discontinued the prednisone.  Antihistamine is ineffective.  Rash itches.  She denies fever or arthralgias. Past Medical History  Diagnosis Date  . Cystocele   . Hypertension   . Depression   . History of Helicobacter pylori infection   . Vitamin D deficiency   . COPD (chronic obstructive pulmonary disease)    Past Surgical History  Procedure Laterality Date  . Cholecystectomy  05/2007  . Appendectomy    . Abdominal hysterectomy      And Removed 1 ovary   Current Outpatient Prescriptions on File Prior to Visit  Medication Sig Dispense Refill  . albuterol (PROVENTIL HFA;VENTOLIN HFA) 108 (90 BASE) MCG/ACT inhaler Inhale 2 puffs into the lungs every 6 (six) hours as needed for wheezing or shortness of breath. 1 Inhaler 0  . amLODipine (NORVASC) 5 MG tablet TAKE 1 TABLET BY MOUTH EVERY DAY 90 tablet 0  . calcium gluconate 500 MG tablet Take 1 tablet (500 mg total) by mouth 3 (three) times daily. 30 tablet 11  .  estradiol (ESTRACE) 0.5 MG tablet TAKE 1 TABLET BY MOUTH EVERY DAY 90 tablet 1  . Fluticasone-Salmeterol (ADVAIR DISKUS) 250-50 MCG/DOSE AEPB Inhale 1 puff into the lungs 2 (two) times daily. 1 each 11  . levocetirizine (XYZAL) 5 MG tablet Take 1 tablet (5 mg total) by mouth every evening. 30 tablet 5  . Vitamin D, Cholecalciferol, 1000 UNITS CAPS Take 1 capsule by mouth daily. 30 capsule 11   No current facility-administered medications on file prior to visit.   Allergies  Allergen Reactions  . Prevalite [Cholestyramine Light] Hives  . Ace Inhibitors Rash   Social History   Social History    . Marital Status: Married    Spouse Name: N/A  . Number of Children: N/A  . Years of Education: N/A   Occupational History  . Not on file.   Social History Main Topics  . Smoking status: Former Smoker -- 0.50 packs/day    Types: Cigarettes    Quit date: 10/30/2010  . Smokeless tobacco: Never Used  . Alcohol Use: No  . Drug Use: No  . Sexual Activity: Not on file   Other Topics Concern  . Not on file   Social History Narrative   Entered 07/2013:   Is retired. Keeps [facility]. Loves it.   Married.      Review of Systems  All other systems reviewed and are negative.      Objective:   Physical Exam  Constitutional: She appears well-developed and well-nourished.  HENT:  Head: Normocephalic and atraumatic.  Nose: Nose normal.  Mouth/Throat: Oropharynx is clear and moist. No oropharyngeal exudate.  Eyes: Conjunctivae are normal. No scleral icterus.  Neck: Neck supple.  Cardiovascular: Normal rate, regular rhythm and normal heart sounds.  Exam reveals no gallop and no friction rub.   No murmur heard. Pulmonary/Chest: Effort normal and breath sounds normal. No respiratory distress. She has no wheezes. She has no rales.  Abdominal: Soft. Bowel sounds are normal. She exhibits no distension. There is no tenderness. There is no rebound and no guarding.  Musculoskeletal: She exhibits no edema.  Lymphadenopathy:    She has no cervical adenopathy.  Vitals reviewed.         Assessment & Plan:  Dermatitis - Plan: Sedimentation rate, ANA, ANCA screen with reflex titer, Pathology, Ambulatory referral to Allergy ESR, ANA, and ANCA to rule out vasculitis.  Using sterile technique after anesthestizing the lesion on her arm with lidocaine 0.2%, I performed a shave biopsy of the 2 cm lesion on the dorsal forearm and obtained hemostasis with drysol.  Lesion sent to pathology to rule out cutaneous T cell lymphoma and discoid lupus.    Consult allergist to evaluate for  possible allergens in food than could be causing what appears to be recurrent urticaria.

## 2014-10-16 LAB — SEDIMENTATION RATE: Sed Rate: 5 mm/hr (ref 0–30)

## 2014-10-18 LAB — ANA: Anti Nuclear Antibody(ANA): NEGATIVE

## 2014-10-19 LAB — PATHOLOGY

## 2014-10-19 LAB — ANCA SCREEN W REFLEX TITER: ANCA SCREEN: NEGATIVE

## 2014-10-25 ENCOUNTER — Telehealth: Payer: Self-pay | Admitting: Family Medicine

## 2014-10-25 DIAGNOSIS — L309 Dermatitis, unspecified: Secondary | ICD-10-CM

## 2014-10-25 NOTE — Telephone Encounter (Signed)
Patient aware of results and wants to wait on the allergist as they cant see her til Jan 2017 - she would like to go see Dr. Allyson Sabal - Derm. Referral placed and pt aware of all

## 2014-10-25 NOTE — Telephone Encounter (Signed)
-----   Message from [name], MD sent at 10/25/2014  7:52 AM EDT ----- Notify patient biopsy did not reveal any evidence of cutaneous T cell lymphoma.  Labs were all WNL.  Therefore, I want her to see allergist as previously scheduled and also see dermatology for second opinion.

## 2014-11-18 ENCOUNTER — Ambulatory Visit (INDEPENDENT_AMBULATORY_CARE_PROVIDER_SITE_OTHER): Payer: Medicare Other | Admitting: Family Medicine

## 2014-11-18 ENCOUNTER — Encounter: Payer: Self-pay | Admitting: Family Medicine

## 2014-11-18 ENCOUNTER — Encounter: Payer: Medicare Other | Admitting: *Deleted

## 2014-11-18 VITALS — BP 170/88 | HR 82 | Resp 16 | Ht 64.0 in | Wt 179.0 lb

## 2014-11-18 DIAGNOSIS — R29818 Other symptoms and signs involving the nervous system: Secondary | ICD-10-CM

## 2014-11-18 DIAGNOSIS — R2689 Other abnormalities of gait and mobility: Secondary | ICD-10-CM

## 2014-11-18 DIAGNOSIS — I10 Essential (primary) hypertension: Secondary | ICD-10-CM

## 2014-11-18 MED ORDER — HYDROCHLOROTHIAZIDE 25 MG PO TABS: 25.0000 mg | ORAL_TABLET | Freq: Every day | ORAL | Status: DC

## 2014-11-18 NOTE — Progress Notes (Signed)
Subjective:    Patient ID: Amanda Pope, female    DOB: 12-04-1945, 69 y.o.   MRN: [id number]  HPI For the last month, the patient feels extremely dizzy all the time. It is unrelated to position changes. She will frequently find herself staggering into walls while she is walking. She has a difficult time walking in a straight line. This all began acutely approximately one month ago here and she denies any vertigo. She denies any hearing loss. She denies any headaches or vision changes. She does report nausea. She feels extremely unsteady on her feet. Her blood pressure is also elevated at 170/88. She is taking estrogen and she is not on an aspirin. Today on examination she has a positive Romberg sign. When she closes her eyes she almost falls down and had to catch her. While walking around the office she does occasionally stagger into a wall unintentionally.  The remainder of her exam is normal. Past Medical History  Diagnosis Date  . Cystocele   . Hypertension   . Depression   . History of Helicobacter pylori infection   . Vitamin D deficiency   . COPD (chronic obstructive pulmonary disease) [facility])    Past Surgical History  Procedure Laterality Date  . Cholecystectomy  05/2007  . Appendectomy    . Abdominal hysterectomy      And Removed 1 ovary   Current Outpatient Prescriptions on File Prior to Visit  Medication Sig Dispense Refill  . albuterol (PROVENTIL HFA;VENTOLIN HFA) 108 (90 BASE) MCG/ACT inhaler Inhale 2 puffs into the lungs every 6 (six) hours as needed for wheezing or shortness of breath. 1 Inhaler 0  . amLODipine (NORVASC) 5 MG tablet TAKE 1 TABLET BY MOUTH EVERY DAY 90 tablet 0  . calcium gluconate 500 MG tablet Take 1 tablet (500 mg total) by mouth 3 (three) times daily. 30 tablet 11  . estradiol (ESTRACE) 0.5 MG tablet TAKE 1 TABLET BY MOUTH EVERY DAY 90 tablet 1  . Fluticasone-Salmeterol (ADVAIR DISKUS) 250-50 MCG/DOSE AEPB Inhale 1 puff into the lungs 2 (two) times  daily. 1 each 11  . levocetirizine (XYZAL) 5 MG tablet Take 1 tablet (5 mg total) by mouth every evening. 30 tablet 5  . Vitamin D, Cholecalciferol, 1000 UNITS CAPS Take 1 capsule by mouth daily. 30 capsule 11   No current facility-administered medications on file prior to visit.   Allergies  Allergen Reactions  . Prevalite [Cholestyramine Light] Hives  . Ace Inhibitors Rash   Social History   Social History  . Marital Status: Married    Spouse Name: N/A  . Number of Children: N/A  . Years of Education: N/A   Occupational History  . Not on file.   Social History Main Topics  . Smoking status: Former Smoker -- 0.50 packs/day    Types: Cigarettes    Quit date: 10/30/2010  . Smokeless tobacco: Never Used  . Alcohol Use: No  . Drug Use: No  . Sexual Activity: Not on file   Other Topics Concern  . 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   Married.      Review of Systems  All other systems reviewed and are negative.      Objective:   Physical Exam  Constitutional: She is oriented to person, place, and time. She appears well-developed and well-nourished.  Eyes: Conjunctivae and EOM are normal. Pupils are equal, round,  and reactive to light.  Neck: Neck supple. No JVD present.  Cardiovascular: Normal rate, regular rhythm and normal heart sounds.   No murmur heard. Pulmonary/Chest: Effort normal and breath sounds normal. No respiratory distress. She has no wheezes. She has no rales.  Abdominal: Soft. Bowel sounds are normal. She exhibits no distension. There is no tenderness. There is no guarding.  Lymphadenopathy:    She has no cervical adenopathy.  Neurological: She is alert and oriented to person, place, and time. She has normal reflexes. She displays normal reflexes. No cranial nerve deficit. She exhibits normal muscle tone. Coordination abnormal.  Vitals reviewed.  + Rhomberg         Assessment & Plan:  Benign essential HTN - Plan: hydrochlorothiazide (HYDRODIURIL) 25 MG tablet, MR Brain Wo Contrast  Balance problem - Plan: MR Brain Wo Contrast  Patient's blood pressure is extremely high. I will add hydrochlorothiazide 25 mg by mouth daily to the amlodipine. I am concerned that she may have suffered a stroke in her cerebellum. Therefore I'm going to have the patient begin an aspirin 81 mg by mouth daily. I want her to discontinue estrogen. I will schedule the patient for an MRI of the brain as soon as possible.

## 2014-11-23 ENCOUNTER — Ambulatory Visit: Payer: Medicare Other | Admitting: Allergy and Immunology

## 2014-11-23 ENCOUNTER — Encounter (INDEPENDENT_AMBULATORY_CARE_PROVIDER_SITE_OTHER): Payer: Self-pay

## 2014-11-23 ENCOUNTER — Ambulatory Visit (INDEPENDENT_AMBULATORY_CARE_PROVIDER_SITE_OTHER): Payer: Medicare Other | Admitting: Allergy and Immunology

## 2014-11-23 ENCOUNTER — Encounter: Payer: Self-pay | Admitting: Allergy and Immunology

## 2014-11-23 VITALS — BP 146/88 | HR 84 | Temp 98.0°F | Resp 16 | Ht 64.57 in | Wt 180.8 lb

## 2014-11-23 DIAGNOSIS — I10 Essential (primary) hypertension: Secondary | ICD-10-CM

## 2014-11-23 DIAGNOSIS — L501 Idiopathic urticaria: Secondary | ICD-10-CM | POA: Diagnosis not present

## 2014-11-23 DIAGNOSIS — J310 Chronic rhinitis: Secondary | ICD-10-CM | POA: Diagnosis not present

## 2014-11-23 DIAGNOSIS — J3089 Other allergic rhinitis: Secondary | ICD-10-CM | POA: Insufficient documentation

## 2014-11-23 DIAGNOSIS — J45909 Unspecified asthma, uncomplicated: Secondary | ICD-10-CM

## 2014-11-23 DIAGNOSIS — J449 Chronic obstructive pulmonary disease, unspecified: Secondary | ICD-10-CM | POA: Diagnosis not present

## 2014-11-23 DIAGNOSIS — J4489 Other specified chronic obstructive pulmonary disease: Secondary | ICD-10-CM | POA: Insufficient documentation

## 2014-11-23 MED ORDER — BECLOMETHASONE DIPROPIONATE 80 MCG/ACT IN AERS: INHALATION_SPRAY | RESPIRATORY_TRACT | Status: DC

## 2014-11-23 NOTE — Assessment & Plan Note (Signed)
Due to the patient's hypertension, we will see if control can be attained without a LABA.  She will be seen for follow up next week.  A prescription has been provided for Qvar 80 g, 2 inhalations twice a day.   To maximize pulmonary deposition, a spacer has been provided along with instructions for its proper administration with an HFA inhaler.  Subjective and objective measures of pulmonary function will be followed and the treatment plan will be adjusted accordingly.

## 2014-11-23 NOTE — Assessment & Plan Note (Signed)
   The patient will return for allergy skin testing next week.  Recommendations will be made based on these results.

## 2014-11-23 NOTE — Assessment & Plan Note (Signed)
   The patient is scheduled to return next week for allergy skin testing after having been off of antihistamines for at least 3 days.  Further recommendations will be made at that time based upon skin test results. 

## 2014-11-23 NOTE — Progress Notes (Signed)
History of present illness: HPI Comments: Amanda Pope is a 69 y.o. female presenting today for her initial consultation for hives. Over the past 4 months, Amanda Pope has experienced recurrent episodes of hives. Typical distribution includes the neck, back, arms and legs.  The lesions are described as erythematous, raised, and pruritic.  Individual hives last less than 24 hours without leaving residual pigmentation or bruising.  She does not experience concomitant cardiopulmonary or GI symptoms.  She has not experienced unexpected weight loss, recurrent fevers or drenching night sweats. No specific medication, food or environmental triggers have been identified. The symptoms do not seem to" correlate with NSAIDs use. Symptoms do not seem to correlate with emotional stress. The patient did not have signs or symptoms of infection at the time of symptom onset. Amanda Pope has tried to control symptoms with the following medications: levocetirizine with good control of symptoms. The patient has not required Emergency Room evaluation and treatment for these symptoms. Skin biopsy has been performed with non-specific results according to the patient.  Amanda Pope reports that she was diagnosed with asthma/COPD in her 70s.  She quit smoking 5 years ago and has a 44-pack-year history.  She states that she has only required albuterol rescue on one occasion over the past 6 months. She also complains of nasal congestion, rhinorrhea, and a dry hacking cough.  The symptoms occur year around but tender be more frequent and severe in the fall.   Assessment and plan: Chronic urticaria  The patient is scheduled to return next week for allergy skin testing after having been off of antihistamines for at least 3 days.  Further recommendations will be made at that time based upon skin test results.  Asthma with COPD Due to the patient's hypertension, we will see if control can be attained without a LABA.  She will be seen for follow up  next week.  A prescription has been provided for Qvar 80 g, 2 inhalations twice a day.   To maximize pulmonary deposition, a spacer has been provided along with instructions for its proper administration with an HFA inhaler.  Subjective and objective measures of pulmonary function will be followed and the treatment plan will be adjusted accordingly.    Chronic rhinitis  The patient will return for allergy skin testing next week.  Recommendations will be made based on these results.  Hypertension  Amanda Pope is to follow up with her primary care physician regarding this issue.       Medications ordered this encounter: Meds ordered this encounter  Medications  . beclomethasone (QVAR) 80 MCG/ACT inhaler    Sig: INHALE TWO PUFFS TWICE DAILY TO PREVENT COUGH OR WHEEZE. RINSE, GARGLE, AND SPIT AFTER USE. USE WITH SPACER.    Dispense:  1 Inhaler    Refill:  5    Diagnositics: Spirometry: FVC was 2.54 L (85% predicted) and FEV1 was 1.55 L (66% predicted) with significant (210 mL, 13%) postbronchodilator improvement. Allergy skin testing: We were unable to perform skin tests today due to recent administration of antihistamine.    Physical examination: Blood pressure 146/88, pulse 84, temperature 98 F (36.7 C), temperature source Oral, resp. rate 16, height 5' 4.57" (1.64 m), weight 180 lb 12.4 oz (82 kg).  General: Alert, interactive, in no acute distress. HEENT: TMs pearly gray, turbinates mildly edematous without discharge, post-pharynx erythematous. Neck: Supple without lymphadenopathy. Lungs: Clear to auscultation without wheezing, rhonchi or rales. CV: Normal S1, S2 without murmurs. Abdomen: Nondistended, nontender. Skin: Warm and dry, without  lesions or rashes. Extremities:  No clubbing, cyanosis or edema. Neuro:   Grossly intact.  Review of systems: Review of Systems  Constitutional: Negative for fever, chills and weight loss.  HENT: Negative for nosebleeds.   Eyes:  Negative for blurred vision.  Respiratory: Negative for hemoptysis.   Cardiovascular: Negative for chest pain.  Gastrointestinal: Negative for diarrhea and constipation.  Genitourinary: Negative for dysuria.  Musculoskeletal: Negative for myalgias and joint pain.  Neurological: Negative for dizziness.  Endo/Heme/Allergies: Does not bruise/bleed easily.    Past medical history: Past Medical History  Diagnosis Date  . Cystocele   . Hypertension   . Depression   . History of Helicobacter pylori infection   . Vitamin D deficiency   . COPD (chronic obstructive pulmonary disease) ([place])   . Urticaria   . Asthma     Past surgical history: Past Surgical History  Procedure Laterality Date  . Cholecystectomy  05/2007  . Appendectomy    . Abdominal hysterectomy      And Removed 1 ovary    Family history: Family History  Problem Relation Age of Onset  . Cancer Mother     colon  . Cancer Sister     ovarian  . Cancer Brother     melanoma  . Cancer Brother 22    colon cancer    Social history: Social History   Social History  . Marital Status: Married    Spouse Name: N/A  . Number of Children: N/A  . Years of Education: N/A   Occupational History  . Not on file.   Social History Main Topics  . Smoking status: Former Smoker -- 1.00 packs/day    Types: Cigarettes    Start date: 01/29/1965    Quit date: 10/30/2010  . Smokeless tobacco: Never Used  . Alcohol Use: No  . Drug Use: No  . Sexual Activity: Not on file   Other Topics Concern  . Not on file   Social History Narrative   Entered 07/2013:   Is retired. 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   Married.   Environmental History: The patient lives in a 67 year old house with carpeting throughout and central air/heat.  There are 2 dogs in house which do not have access to her bedroom.  Known medication allergies: Allergies  Allergen Reactions  . Prevalite [Cholestyramine Light] Hives  . Ace Inhibitors Rash     Outpatient medications:   Medication List       This list is accurate as of: 11/23/14  2:44 PM.  Always use your most recent med list.               albuterol 108 (90 BASE) MCG/ACT inhaler  Commonly known as:  PROVENTIL HFA;VENTOLIN HFA  Inhale 2 puffs into the lungs every 6 (six) hours as needed for wheezing or shortness of breath.     amLODipine 5 MG tablet  Commonly known as:  NORVASC  TAKE 1 TABLET BY MOUTH EVERY DAY     beclomethasone 80 MCG/ACT inhaler  Commonly known as:  QVAR  INHALE TWO PUFFS TWICE DAILY TO PREVENT COUGH OR WHEEZE. RINSE, GARGLE, AND SPIT AFTER USE. USE WITH SPACER.     calcium gluconate 500 MG tablet  Take 1 tablet (500 mg total) by mouth 3 (three) times daily.     estradiol 0.5 MG tablet  Commonly known as:  ESTRACE  TAKE 1 TABLET BY MOUTH EVERY DAY     Fluticasone-Salmeterol 250-50 MCG/DOSE Aepb  Commonly  known as:  ADVAIR DISKUS  Inhale 1 puff into the lungs 2 (two) times daily.     hydrochlorothiazide 25 MG tablet  Commonly known as:  HYDRODIURIL  Take 1 tablet (25 mg total) by mouth daily.     levocetirizine 5 MG tablet  Commonly known as:  XYZAL  Take 1 tablet (5 mg total) by mouth every evening.     Vitamin D (Cholecalciferol) 1000 UNITS Caps  Take 1 capsule by mouth daily.        I appreciate the opportunity to take part in this Amanda Pope's care. Please do not hesitate to contact me with questions.  Sincerely,   R. Edgar Frisk, MD

## 2014-11-23 NOTE — Assessment & Plan Note (Signed)
   Amanda Pope is to follow up with her primary care physician regarding this issue.

## 2014-11-23 NOTE — Patient Instructions (Addendum)
Chronic urticaria  The patient is scheduled to return next week for allergy skin testing after having been off of antihistamines for at least 3 days.  Further recommendations will be made at that time based upon skin test results.  Asthma with COPD Due to the patient's hypertension, we will see if control can be attained without a LABA.  She will be seen for follow up next week.  A prescription has been provided for Qvar 80 g, 2 inhalations twice a day.   To maximize pulmonary deposition, a spacer has been provided along with instructions for its proper administration with an HFA inhaler.  Subjective and objective measures of pulmonary function will be followed and the treatment plan will be adjusted accordingly.    Chronic rhinitis  The patient will return for allergy skin testing next week.  Recommendations will be made based on these results.  Hypertension  Amanda Pope is to follow up with her primary care physician regarding this issue.       Return in about 1 week (around 11/30/2014), or if symptoms worsen or fail to improve.

## 2014-11-24 ENCOUNTER — Other Ambulatory Visit: Payer: Self-pay | Admitting: Family Medicine

## 2014-11-24 DIAGNOSIS — R2689 Other abnormalities of gait and mobility: Secondary | ICD-10-CM

## 2014-11-24 DIAGNOSIS — I10 Essential (primary) hypertension: Secondary | ICD-10-CM

## 2014-11-30 ENCOUNTER — Ambulatory Visit (INDEPENDENT_AMBULATORY_CARE_PROVIDER_SITE_OTHER): Payer: Medicare Other | Admitting: Allergy and Immunology

## 2014-11-30 VITALS — BP 130/80 | HR 85 | Temp 97.9°F | Resp 16

## 2014-11-30 DIAGNOSIS — J45909 Unspecified asthma, uncomplicated: Secondary | ICD-10-CM | POA: Diagnosis not present

## 2014-11-30 DIAGNOSIS — J3089 Other allergic rhinitis: Secondary | ICD-10-CM | POA: Diagnosis not present

## 2014-11-30 DIAGNOSIS — L501 Idiopathic urticaria: Secondary | ICD-10-CM

## 2014-11-30 DIAGNOSIS — I10 Essential (primary) hypertension: Secondary | ICD-10-CM

## 2014-11-30 DIAGNOSIS — J4489 Other specified chronic obstructive pulmonary disease: Secondary | ICD-10-CM

## 2014-11-30 DIAGNOSIS — J449 Chronic obstructive pulmonary disease, unspecified: Secondary | ICD-10-CM | POA: Diagnosis not present

## 2014-11-30 DIAGNOSIS — Z8619 Personal history of other infectious and parasitic diseases: Secondary | ICD-10-CM | POA: Diagnosis not present

## 2014-11-30 MED ORDER — FLUTICASONE PROPIONATE 50 MCG/ACT NA SUSP: 2.0000 | Freq: Every day | NASAL | Status: DC

## 2014-11-30 MED ORDER — BUDESONIDE-FORMOTEROL FUMARATE 160-4.5 MCG/ACT IN AERO: 2.0000 | INHALATION_SPRAY | Freq: Two times a day (BID) | RESPIRATORY_TRACT | Status: DC

## 2014-11-30 NOTE — Assessment & Plan Note (Signed)
   A prescription has been provided for Symbicort (budesonide/formoterol) 160/4.5 g, 2 inhalations via spacer device twice a day.  Continue albuterol HFA, 1-2 inhalations every 4-6 hours as needed and 15 minutes prior to exercise.  Subjective and objective measures of pulmonary function will be followed and the treatment plan will be adjusted accordingly.

## 2014-11-30 NOTE — Assessment & Plan Note (Signed)
   Amanda Pope has been asked to follow up with her primary care physician regarding this issue.  Patient has verbalized understanding and has agreed to do so.

## 2014-11-30 NOTE — Assessment & Plan Note (Signed)
   Aeroallergen avoidance measures have been discussed and provided in written form.   A prescription has been provided for fluticasone nasal spray, 2 sprays per nostril daily as needed. Proper nasal spray technique has been discussed and demonstrated.  Nasal saline lavage as needed has been recommended along with instructions for proper administration.

## 2014-11-30 NOTE — Progress Notes (Signed)
  History of present illness: HPI Comments: Amanda Pope is a 69 y.o. female with chronic urticaria, asthma/COPD, rhinitis, and hypertension who returns today for follow up and allergy skin testing.  She was unable to undergo skin testing last week due to recent administration of antihistamine.  Please see history of present illness from 11/23/2014 for details.  She was switched from Advair to Qvar last week in hopes of controlling her asthma COPD without a long-acting beta agonist in a patient with hypertension, however she reports that over this past week she has noticed significantly increased dyspnea, particularly with mild/moderate exertion.  Her lower respiratory symptoms have hindered her activities of daily living over this past week.  Therefore, she restarted Advair 250/50 g, one inhalation twice a day.  She reports that urticaria has recurred having discontinued antihistamines over the past 3 days in anticipation of today's skin testing.   Assessment and plan: Other allergic rhinitis  Aeroallergen avoidance measures have been discussed and provided in written form.   A prescription has been provided for fluticasone nasal spray, 2 sprays per nostril daily as needed. Proper nasal spray technique has been discussed and demonstrated.  Nasal saline lavage as needed has been recommended along with instructions for proper administration.      Chronic urticaria There is no obvious etiology identified. Skin tests to select food allergens were negative today. NSAIDs and emotional stress commonly exacerbate urticaria but are not the underlying etiology in this case. Physical urticarias are negative by history (i.e. pressure-induced, temperature, vibration, solar, etc.). History and lesions are not consistent with urticaria pigmentosa so I am not suspicious for mastocytosis. There are no concomitant symptoms concerning for anaphylaxis or constitutional symptoms worrisome for an underlying  malignancy. We will rule out other potential etiologies with labs. For symptom relief, patient is to take oral antihistamines as directed.  The following labs have been ordered: FCeRI antibody, TSH, anti-thyroglobulin antibody, thyroid peroxidase antibody, tryptase, urea breath test, ESR, ANA, and galactose-alpha-1,3-galactose IgE level.  The patient will be called with further recommendations after lab results have returned.  Instructions have been provided and discussed for H1/H2 receptor blockade with titration to find lowest effective dose.  A journal is to be kept recording any foods eaten, beverages consumed, medications taken within a 6 hour period prior to the onset of symptoms, as well as record activities being performed, and environmental conditions. For any symptoms concerning for anaphylaxis, 911 is to be called immediately.  Asthma with COPD  A prescription has been provided for Symbicort (budesonide/formoterol) 160/4.5 g, 2 inhalations via spacer device twice a day.  Continue albuterol HFA, 1-2 inhalations every 4-6 hours as needed and 15 minutes prior to exercise.  Subjective and objective measures of pulmonary function will be followed and the treatment plan will be adjusted accordingly.    Hypertension  Fay has been asked to follow up with her primary care physician regarding this issue.  Patient has verbalized understanding and has agreed to do so.     Medications ordered this encounter: Meds ordered this encounter  Medications  . budesonide-formoterol (SYMBICORT) 160-4.5 MCG/ACT inhaler    Sig: Inhale 2 puffs into the lungs 2 (two) times daily.    Dispense:  1 Inhaler    Refill:  3  . fluticasone (FLONASE) 50 MCG/ACT nasal spray    Sig: Place 2 sprays into both nostrils daily.    Dispense:  16 g    Refill:  5    Diagnositics: Spirometry reveals   FVC of 2.36 L (79% predicted) and FEV1 is 1.55 L (66% predicted) with 9% post-bronchial dilator  improvement. Environmental skin tests: Positive to grass pollen, weed pollen, ragweed pollen, tree pollen, and mold. Food allergen skin tests: Negative despite a positive histamine control.     Physical examination: Blood pressure 130/80, pulse 85, temperature 97.9 F (36.6 C), temperature source Oral, resp. rate 16.  General: Alert, interactive, in no acute distress. HEENT: TMs pearly gray, turbinates moderately edematous without discharge, post-pharynx markedly erythematous. Neck: Supple without lymphadenopathy. Lungs: Mildly decreased breath sounds bilaterally without wheezing, rhonchi or rales. CV: Normal S1, S2 without murmurs. Skin: Warm and dry, without lesions or rashes.  The following portions of the patient's history were reviewed and updated as appropriate: allergies, current medications, past family history, past medical history, past social history, past surgical history and problem list.  Outpatient medications:   Medication List       This list is accurate as of: 11/30/14  1:48 PM.  Always use your most recent med list.               albuterol 108 (90 BASE) MCG/ACT inhaler  Commonly known as:  PROVENTIL HFA;VENTOLIN HFA  Inhale 2 puffs into the lungs every 6 (six) hours as needed for wheezing or shortness of breath.     amLODipine 5 MG tablet  Commonly known as:  NORVASC  TAKE 1 TABLET BY MOUTH EVERY DAY     beclomethasone 80 MCG/ACT inhaler  Commonly known as:  QVAR  INHALE TWO PUFFS TWICE DAILY TO PREVENT COUGH OR WHEEZE. RINSE, GARGLE, AND SPIT AFTER USE. USE WITH SPACER.     budesonide-formoterol 160-4.5 MCG/ACT inhaler  Commonly known as:  SYMBICORT  Inhale 2 puffs into the lungs 2 (two) times daily.     calcium gluconate 500 MG tablet  Take 1 tablet (500 mg total) by mouth 3 (three) times daily.     estradiol 0.5 MG tablet  Commonly known as:  ESTRACE  TAKE 1 TABLET BY MOUTH EVERY DAY     fluticasone 50 MCG/ACT nasal spray  Commonly known as:   FLONASE  Place 2 sprays into both nostrils daily.     Fluticasone-Salmeterol 250-50 MCG/DOSE Aepb  Commonly known as:  ADVAIR DISKUS  Inhale 1 puff into the lungs 2 (two) times daily.     hydrochlorothiazide 25 MG tablet  Commonly known as:  HYDRODIURIL  Take 1 tablet (25 mg total) by mouth daily.     levocetirizine 5 MG tablet  Commonly known as:  XYZAL  Take 1 tablet (5 mg total) by mouth every evening.     Vitamin D (Cholecalciferol) 1000 UNITS Caps  Take 1 capsule by mouth daily.        Known medication allergies: Allergies  Allergen Reactions  . Prevalite [Cholestyramine Light] Hives  . Ace Inhibitors Rash    I appreciate the opportunity to take part in this Fay's care. Please do not hesitate to contact me with questions.  Sincerely,   R. Edgar Frisk, MD

## 2014-11-30 NOTE — Patient Instructions (Addendum)
Other allergic rhinitis  Aeroallergen avoidance measures have been discussed and provided in written form.   A prescription has been provided for fluticasone nasal spray, 2 sprays per nostril daily as needed. Proper nasal spray technique has been discussed and demonstrated.  Nasal saline lavage as needed has been recommended along with instructions for proper administration.      Chronic urticaria There is no obvious etiology identified. Skin tests to select food allergens were negative today. NSAIDs and emotional stress commonly exacerbate urticaria but are not the underlying etiology in this case. Physical urticarias are negative by history (i.e. pressure-induced, temperature, vibration, solar, etc.). History and lesions are not consistent with urticaria pigmentosa so I am not suspicious for mastocytosis. There are no concomitant symptoms concerning for anaphylaxis or constitutional symptoms worrisome for an underlying malignancy. We will rule out other potential etiologies with labs. For symptom relief, patient is to take oral antihistamines as directed.  The following labs have been ordered: FCeRI antibody, TSH, anti-thyroglobulin antibody, thyroid peroxidase antibody, tryptase, urea breath test, ESR, ANA, and galactose-alpha-1,3-galactose IgE level.  The patient will be called with further recommendations after lab results have returned.  Instructions have been provided and discussed for H1/H2 receptor blockade with titration to find lowest effective dose.  A journal is to be kept recording any foods eaten, beverages consumed, medications taken within a 6 hour period prior to the onset of symptoms, as well as record activities being performed, and environmental conditions. For any symptoms concerning for anaphylaxis, 911 is to be called immediately.  Asthma with COPD  A prescription has been provided for Symbicort (budesonide/formoterol) 160/4.5 g, 2 inhalations via spacer device twice  a day.  Continue albuterol HFA, 1-2 inhalations every 4-6 hours as needed and 15 minutes prior to exercise.  Subjective and objective measures of pulmonary function will be followed and the treatment plan will be adjusted accordingly.    Hypertension  Amanda Pope has been asked to follow up with her primary care physician regarding this issue.  Patient has verbalized understanding and has agreed to do so.     Return in about 4 weeks (around 12/28/2014), or if symptoms worsen or fail to improve.  Reducing Pollen Exposure  The American Academy of Allergy, Asthma and Immunology suggests the following steps to reduce your exposure to pollen during allergy seasons.    1. Do not hang sheets or clothing out to dry; pollen may collect on these items. 2. Do not mow lawns or spend time around freshly cut grass; mowing stirs up pollen. 3. Keep windows closed at night.  Keep car windows closed while driving. 4. Minimize morning activities outdoors, a time when pollen counts are usually at their highest. 5. Stay indoors as much as possible when pollen counts or humidity is high and on windy days when pollen tends to remain in the air longer. 6. Use air conditioning when possible.  Many air conditioners have filters that trap the pollen spores. 7. Use a HEPA room air filter to remove pollen form the indoor air you breathe.   Control of Mold Allergen  Mold and fungi can grow on a variety of surfaces provided certain temperature and moisture conditions exist.  Outdoor molds grow on plants, decaying vegetation and soil.  The major outdoor mold, Alternaria dn Cladosporium, are found in very high numbers during hot and dry conditions.  Generally, a late Summer - Fall peak is seen for common outdoor fungal spores.  Rain will temporarily lower outdoor mold spore count,  but counts rise rapidly when the rainy period ends.  The most important indoor molds are Aspergillus and Penicillium.  Dark, humid and poorly  ventilated basements are ideal sites for mold growth.  The next most common sites of mold growth are the bathroom and the kitchen.  Outdoor [employer] 2. Use air conditioning and keep windows closed 3. Avoid exposure to decaying vegetation. 4. Avoid leaf raking. 5. Avoid grain handling. 6. Consider wearing a face mask if working in moldy areas.  Indoor Mold Control 1. Maintain humidity below 50%. 2. Clean washable surfaces with 5% bleach solution. 3. Remove sources e.g. Contaminated carpets.

## 2014-11-30 NOTE — Assessment & Plan Note (Signed)
There is no obvious etiology identified. Skin tests to select food allergens were negative today. NSAIDs and emotional stress commonly exacerbate urticaria but are not the underlying etiology in this case. Physical urticarias are negative by history (i.e. pressure-induced, temperature, vibration, solar, etc.). History and lesions are not consistent with urticaria pigmentosa so I am not suspicious for mastocytosis. There are no concomitant symptoms concerning for anaphylaxis or constitutional symptoms worrisome for an underlying malignancy. We will rule out other potential etiologies with labs. For symptom relief, patient is to take oral antihistamines as directed.  The following labs have been ordered: FCeRI antibody, TSH, anti-thyroglobulin antibody, thyroid peroxidase antibody, tryptase, urea breath test, ESR, ANA, and galactose-alpha-1,3-galactose IgE level.  The patient will be called with further recommendations after lab results have returned.  Instructions have been provided and discussed for H1/H2 receptor blockade with titration to find lowest effective dose.  A journal is to be kept recording any foods eaten, beverages consumed, medications taken within a 6 hour period prior to the onset of symptoms, as well as record activities being performed, and environmental conditions. For any symptoms concerning for anaphylaxis, 911 is to be called immediately.

## 2014-12-02 ENCOUNTER — Other Ambulatory Visit: Payer: Self-pay | Admitting: Allergy and Immunology

## 2014-12-02 LAB — TSH: TSH: 5.401 u[IU]/mL — ABNORMAL HIGH (ref 0.350–4.500)

## 2014-12-03 LAB — ANA: Anti Nuclear Antibody(ANA): NEGATIVE

## 2014-12-03 LAB — SEDIMENTATION RATE: Sed Rate: 4 mm/hr (ref 0–30)

## 2014-12-03 LAB — THYROGLOBULIN ANTIBODY: Thyroglobulin Ab: 2 IU/mL — ABNORMAL HIGH (ref <2)

## 2014-12-03 LAB — THYROID PEROXIDASE ANTIBODY: Thyroperoxidase Ab SerPl-aCnc: 2 IU/mL (ref <9)

## 2014-12-03 LAB — H. PYLORI BREATH TEST: H. pylori Breath Test: DETECTED — AB

## 2014-12-07 ENCOUNTER — Other Ambulatory Visit: Payer: Self-pay

## 2014-12-07 ENCOUNTER — Other Ambulatory Visit: Payer: Self-pay | Admitting: *Deleted

## 2014-12-07 LAB — TRYPTASE: Tryptase: 3.4 ug/L (ref <11)

## 2014-12-07 LAB — GALACTOSE-ALPHA-1,3-GALACTOSE IGE: Galactose-alpha-1,3-galactose IgE: <0.1 kU/L (ref <0.35)

## 2014-12-07 MED ORDER — AMOXICILLIN 500 MG PO TABS: ORAL_TABLET | ORAL | Status: DC

## 2014-12-07 MED ORDER — OMEPRAZOLE 20 MG PO CPDR: 20.0000 mg | DELAYED_RELEASE_CAPSULE | Freq: Two times a day (BID) | ORAL | Status: DC

## 2014-12-07 MED ORDER — CLARITHROMYCIN 500 MG PO TABS: 500.0000 mg | ORAL_TABLET | Freq: Two times a day (BID) | ORAL | Status: DC

## 2014-12-08 ENCOUNTER — Telehealth: Payer: Self-pay

## 2014-12-08 NOTE — Telephone Encounter (Signed)
Please advise 

## 2014-12-08 NOTE — Telephone Encounter (Signed)
PATIENT WAS TOLD SHE HAS BACTERIA IN HER STOMACH AND SHE WAS WONDERING IS THIS CONTAGIOUS AND SHOULD SHE BE CONCERNED ABOUT THIS ISSUE.  PLEASE ADVISE  THANKS

## 2014-12-08 NOTE — Telephone Encounter (Signed)
Please reassure the patient that it is not contagious.  This bacteria, H. pylori, may lead to gastritis and is possibly associated with hives.

## 2014-12-09 ENCOUNTER — Encounter: Payer: Self-pay | Admitting: Physician Assistant

## 2014-12-09 ENCOUNTER — Ambulatory Visit (INDEPENDENT_AMBULATORY_CARE_PROVIDER_SITE_OTHER): Payer: Medicare Other | Admitting: Physician Assistant

## 2014-12-09 VITALS — BP 112/66 | HR 68 | Temp 98.3°F | Resp 18 | Wt 184.0 lb

## 2014-12-09 DIAGNOSIS — E038 Other specified hypothyroidism: Secondary | ICD-10-CM | POA: Diagnosis not present

## 2014-12-09 DIAGNOSIS — E039 Hypothyroidism, unspecified: Secondary | ICD-10-CM

## 2014-12-09 LAB — T4, FREE: Free T4: 0.94 ng/dL (ref 0.80–1.80)

## 2014-12-09 NOTE — Telephone Encounter (Signed)
Called and left voicemail for patient to return call.

## 2014-12-09 NOTE — Progress Notes (Signed)
Patient ID: Amanda Pope MRN: [id number], DOB: 11-19-1945, 69 y.o. Date of Encounter: 12/09/2014, 11:53 AM    Chief Complaint:  Chief Complaint  Patient presents with  . thyroid issue    labs were done at [facility]  . positive H-Pylori     HPI: 69 y.o. year old white female presents with above. States that she just saw her allergist. Test was positive for H. pylori and they have prescribed medications to treat that. Test also showed abnormal TSH and she was told to follow-up here for further evaluation of that. No other complaints or concerns today.     Home Meds:   Outpatient Prescriptions Prior to Visit  Medication Sig Dispense Refill  . albuterol (PROVENTIL HFA;VENTOLIN HFA) 108 (90 BASE) MCG/ACT inhaler Inhale 2 puffs into the lungs every 6 (six) hours as needed for wheezing or shortness of breath. 1 Inhaler 0  . amoxicillin (AMOXIL) 500 MG tablet TAKE 2 TABLETS TWICE DAILY FOR 14 DAYS. 56 tablet 0  . budesonide-formoterol (SYMBICORT) 160-4.5 MCG/ACT inhaler Inhale 2 puffs into the lungs 2 (two) times daily. 1 Inhaler 3  . calcium gluconate 500 MG tablet Take 1 tablet (500 mg total) by mouth 3 (three) times daily. 30 tablet 11  . clarithromycin (BIAXIN) 500 MG tablet Take 1 tablet (500 mg total) by mouth 2 (two) times daily. 28 tablet 0  . estradiol (ESTRACE) 0.5 MG tablet TAKE 1 TABLET BY MOUTH EVERY DAY 90 tablet 1  . fluticasone (FLONASE) 50 MCG/ACT nasal spray Place 2 sprays into both nostrils daily. 16 g 5  . hydrochlorothiazide (HYDRODIURIL) 25 MG tablet Take 1 tablet (25 mg total) by mouth daily. 90 tablet 3  . levocetirizine (XYZAL) 5 MG tablet Take 1 tablet (5 mg total) by mouth every evening. 30 tablet 5  . Vitamin D, Cholecalciferol, 1000 UNITS CAPS Take 1 capsule by mouth daily. 30 capsule 11  . beclomethasone (QVAR) 80 MCG/ACT inhaler INHALE TWO PUFFS TWICE DAILY TO PREVENT COUGH OR WHEEZE. RINSE, GARGLE, AND SPIT AFTER USE. USE WITH SPACER. 1 Inhaler 5  .  omeprazole (PRILOSEC) 20 MG capsule Take 1 capsule (20 mg total) by mouth 2 (two) times daily before a meal. (Patient not taking: Reported on 12/09/2014) 28 capsule 0  . amLODipine (NORVASC) 5 MG tablet TAKE 1 TABLET BY MOUTH EVERY DAY (Patient not taking: Reported on 11/23/2014) 90 tablet 0  . Fluticasone-Salmeterol (ADVAIR DISKUS) 250-50 MCG/DOSE AEPB Inhale 1 puff into the lungs 2 (two) times daily. 1 each 11   No facility-administered medications prior to visit.    Allergies:  Allergies  Allergen Reactions  . Prevalite [Cholestyramine Light] Hives  . Ace Inhibitors Rash      Review of Systems: See HPI for pertinent ROS. All other ROS negative.    Physical Exam: Blood pressure 112/66, pulse 68, temperature 98.3 F (36.8 C), temperature source Oral, resp. rate 18, weight 184 lb (83.462 kg)., Body mass index is 31.03 kg/(m^2). General:  WNWD WF. Appears in no acute distress. Neck: Supple. No thyromegaly. No lymphadenopathy. Lungs: Clear bilaterally to auscultation without wheezes, rales, or rhonchi. Breathing is unlabored. Heart: Regular rhythm. No murmurs, rubs, or gallops. Msk:  Strength and tone normal for age. Extremities/Skin: Warm and dry. Neuro: Alert and oriented X 3. Moves all extremities spontaneously. Gait is normal. CNII-XII grossly in tact. Psych:  Responds to questions appropriately with a normal affect.     ASSESSMENT AND PLAN:  69 y.o. year old female with  1. Subclinical hypothyroidism Recent TSH by allergist is in epic. Prior TSHs are also in epic. Today we will add a free T4 to this then make further recommendations. - T4, free   Signed, [address] [place], [place], [facility] 12/09/2014 11:53 AM

## 2014-12-10 NOTE — Telephone Encounter (Signed)
Called and spoke with patient regarding H. Pylori.  Reassured patient that it was not contagious.

## 2014-12-28 ENCOUNTER — Ambulatory Visit: Payer: Medicare Other | Admitting: Allergy and Immunology

## 2015-01-04 ENCOUNTER — Ambulatory Visit: Payer: Medicare Other | Admitting: Allergy and Immunology

## 2015-02-24 ENCOUNTER — Encounter: Payer: Self-pay | Admitting: Physician Assistant

## 2015-02-24 ENCOUNTER — Ambulatory Visit (INDEPENDENT_AMBULATORY_CARE_PROVIDER_SITE_OTHER): Payer: PPO | Admitting: Physician Assistant

## 2015-02-24 VITALS — BP 146/86 | HR 72 | Temp 98.0°F | Resp 18 | Wt 199.0 lb

## 2015-02-24 DIAGNOSIS — Z79890 Hormone replacement therapy (postmenopausal): Secondary | ICD-10-CM

## 2015-02-24 DIAGNOSIS — E559 Vitamin D deficiency, unspecified: Secondary | ICD-10-CM

## 2015-02-24 DIAGNOSIS — I10 Essential (primary) hypertension: Secondary | ICD-10-CM

## 2015-02-24 DIAGNOSIS — E038 Other specified hypothyroidism: Secondary | ICD-10-CM

## 2015-02-24 DIAGNOSIS — J439 Emphysema, unspecified: Secondary | ICD-10-CM

## 2015-02-24 DIAGNOSIS — R739 Hyperglycemia, unspecified: Secondary | ICD-10-CM | POA: Diagnosis not present

## 2015-02-24 DIAGNOSIS — E039 Hypothyroidism, unspecified: Secondary | ICD-10-CM

## 2015-02-24 NOTE — Progress Notes (Signed)
Patient ID: [name] MRN: [id number], DOB: 1945/05/29, 70 y.o. Date of Encounter: @DATE @  Chief Complaint:  Chief Complaint  Patient presents with  . 6 mth check up    not fasting    HPI: 70 y.o. year old white female  presents for routine f/u and labs.   He is upset today that she has gained back 20 of the pounds that she had lost in the past. Her visit with me last year she reported that she had lost 30 pounds. A she says that she "got off what she was doing" and hasn't had "the willpower to start it back." Says that [facility] was [facility] was doing the seniors class sometimes and other times doing treadmill. Is that she was also doing weight watchers. Says she got tired of constantly thinking about what she was eating and what exercise she was doing and plus back then was feeling bad in regards to GI symptoms from H pylori etc. When she got off track she didn't realize how much weight she was gaining back. That she weighed today she says that she will get back on track with diet changes and will start back to exercise also.  No other complaints or concerns today.  She is taking blood pressure medicine as directed. No adverse effects. No lightheadedness.   Past Medical History  Diagnosis Date  . Cystocele   . Hypertension   . Depression   . History of Helicobacter pylori infection   . Vitamin D deficiency   . COPD (chronic obstructive pulmonary disease) ([place])   . Urticaria   . Asthma      Home Meds: Outpatient Prescriptions Prior to Visit  Medication Sig Dispense Refill  . albuterol (PROVENTIL HFA;VENTOLIN HFA) 108 (90 BASE) MCG/ACT inhaler Inhale 2 puffs into the lungs every 6 (six) hours as needed for wheezing or shortness of breath. 1 Inhaler 0  . amoxicillin (AMOXIL) 500 MG tablet TAKE 2 TABLETS TWICE DAILY FOR 14 DAYS. 56 tablet 0  . budesonide-formoterol (SYMBICORT) 160-4.5 MCG/ACT inhaler Inhale 2 puffs into the lungs 2 (two) times daily. 1 Inhaler 3  .  calcium gluconate 500 MG tablet Take 1 tablet (500 mg total) by mouth 3 (three) times daily. 30 tablet 11  . clarithromycin (BIAXIN) 500 MG tablet Take 1 tablet (500 mg total) by mouth 2 (two) times daily. 28 tablet 0  . estradiol (ESTRACE) 0.5 MG tablet TAKE 1 TABLET BY MOUTH EVERY DAY 90 tablet 1  . fluticasone (FLONASE) 50 MCG/ACT nasal spray Place 2 sprays into both nostrils daily. 16 g 5  . hydrochlorothiazide (HYDRODIURIL) 25 MG tablet Take 1 tablet (25 mg total) by mouth daily. 90 tablet 3  . levocetirizine (XYZAL) 5 MG tablet Take 1 tablet (5 mg total) by mouth every evening. 30 tablet 5  . Vitamin D, Cholecalciferol, 1000 UNITS CAPS Take 1 capsule by mouth daily. 30 capsule 11  . omeprazole (PRILOSEC) 20 MG capsule Take 1 capsule (20 mg total) by mouth 2 (two) times daily before a meal. (Patient not taking: Reported on 12/09/2014) 28 capsule 0   No facility-administered medications prior to visit.    Allergies:  Allergies  Allergen Reactions  . Prevalite [Cholestyramine Light] Hives  . Ace Inhibitors Rash    Social History   Social History  . Marital Status: Married    Spouse Name: N/A  . Number of Children: N/A  . Years of Education: N/A   Occupational History  .  Not on file.   Social History Main Topics  . Smoking status: Former Smoker -- 1.00 packs/day    Types: Cigarettes    Start date: 01/29/1965    Quit date: 10/30/2010  . Smokeless tobacco: Never Used  . Alcohol Use: No  . Drug Use: No  . Sexual Activity: Not on file   Other Topics Concern  . Not on file   Social History Narrative   Entered 07/2013:   Is retired. 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   Married.    Family History  Problem Relation Age of Onset  . Cancer Mother     colon  . Cancer Sister     ovarian  . Cancer Brother     melanoma  . Cancer Brother 16    colon cancer     Review of Systems:  See HPI for pertinent ROS. All other ROS negative.    Physical Exam: Blood pressure  146/86, pulse 72, temperature 98 F (36.7 C), temperature source Oral, resp. rate 18, weight 199 lb (90.266 kg)., Body mass index is 33.56 kg/(m^2). General: Overweight white female . Appears in no acute distress. Neck: Supple. No thyromegaly. No lymphadenopathy. No carotid bruits. Lungs: Clear bilaterally to auscultation without wheezes, rales, or rhonchi. Breathing is unlabored. Heart: RRR with S1 S2. No murmurs, rubs, or gallops. Abdomen: Soft, non-tender, non-distended with normoactive bowel sounds. No hepatomegaly. No rebound/guarding. No obvious abdominal masses. Musculoskeletal:  Strength and tone normal for age. Extremities/Skin: Warm and dry.  No edema.  Neuro: Alert and oriented X 3. Moves all extremities spontaneously. Gait is normal. CNII-XII grossly in tact. Psych:  Responds to questions appropriately with a normal affect.     ASSESSMENT AND PLAN:  70 y.o. year old female with  1. Essential hypertension Blood Pressure reading is a little high today. I reviewed recent blood pressure readings. 12/09/14 office visit blood pressure was 112/66. Office visit 11/30/14 blood pressure was 130/80. Therefore, will not change medications today. However if blood pressure stays elevated then we'll need to adjust meds. As well, she does seem very motivated to lose weight and improve her diet and exercise which will help lower blood pressure. - COMPLETE METABOLIC PANEL WITH GFR  2. Vitamin D deficiency Vitamin D has been low in the past and she is on vitamin D supplement --recheck level now. - VITAMIN D 25 Hydroxy (Vit-D Deficiency, Fractures)  3. Pulmonary emphysema, unspecified emphysema type ([place]) She is on Symbicort. This is currently stable/controlled.  4. Subclinical hypothyroidism Check lab to monitor. - TSH - T4, free  5. Hyperglycemia See HPI regarding weight loss then weight gain/ diet, exercise - COMPLETE METABOLIC PANEL WITH GFR - Hemoglobin A1c  Follow-up with patient  when we get lab results. Plan for routine follow-up in 6 months or sooner if needed.    Marin Olp [place], [place], [facility] 02/24/2015 2:25 PM

## 2015-02-25 LAB — COMPLETE METABOLIC PANEL WITH GFR
ALBUMIN: 4 g/dL (ref 3.6–5.1)
ALK PHOS: 83 U/L (ref 33–130)
ALT: 14 U/L (ref 6–29)
AST: 11 U/L (ref 10–35)
BILIRUBIN TOTAL: 0.7 mg/dL (ref 0.2–1.2)
BUN: 16 mg/dL (ref 7–25)
CALCIUM: 9.5 mg/dL (ref 8.6–10.4)
CO2: 29 mmol/L (ref 20–31)
CREATININE: 0.81 mg/dL (ref 0.50–0.99)
Chloride: 99 mmol/L (ref 98–110)
GFR, EST AFRICAN AMERICAN: 87 mL/min (ref >=60)
GFR, EST NON AFRICAN AMERICAN: 75 mL/min (ref >=60)
Glucose, Bld: 75 mg/dL (ref 70–99)
Potassium: 3.9 mmol/L (ref 3.5–5.3)
Sodium: 140 mmol/L (ref 135–146)
TOTAL PROTEIN: 6.7 g/dL (ref 6.1–8.1)

## 2015-02-25 LAB — HEMOGLOBIN A1C
HEMOGLOBIN A1C: 5.8 % — AB (ref <5.7)
MEAN PLASMA GLUCOSE: 120 mg/dL — AB (ref <117)

## 2015-02-25 LAB — T4, FREE: Free T4: 1.02 ng/dL (ref 0.80–1.80)

## 2015-02-25 LAB — TSH: TSH: 5.859 u[IU]/mL — AB (ref 0.350–4.500)

## 2015-02-25 LAB — VITAMIN D 25 HYDROXY (VIT D DEFICIENCY, FRACTURES): Vit D, 25-Hydroxy: 23 ng/mL — ABNORMAL LOW (ref 30–100)

## 2015-02-28 ENCOUNTER — Other Ambulatory Visit: Payer: Self-pay | Admitting: Family Medicine

## 2015-02-28 MED ORDER — VITAMIN D 50 MCG (2000 UT) PO CAPS: 1.0000 | ORAL_CAPSULE | Freq: Every day | ORAL | Status: DC

## 2015-03-14 ENCOUNTER — Other Ambulatory Visit: Payer: Self-pay | Admitting: Physician Assistant

## 2015-03-14 NOTE — Telephone Encounter (Signed)
Medication refilled per protocol. 

## 2015-04-19 ENCOUNTER — Telehealth: Payer: Self-pay | Admitting: Family Medicine

## 2015-04-19 NOTE — Telephone Encounter (Signed)
PA requested for Estradiol 0.5 mg tablet.  Submitted thru [facility]

## 2015-04-19 NOTE — Telephone Encounter (Signed)
rec'd approval for Estradiol from 04/19/15 - 01/29/16.  Approval faxed to pharmacy

## 2015-04-23 ENCOUNTER — Other Ambulatory Visit: Payer: Self-pay | Admitting: Family Medicine

## 2015-05-09 ENCOUNTER — Encounter: Payer: Self-pay | Admitting: Physician Assistant

## 2015-05-09 ENCOUNTER — Ambulatory Visit (INDEPENDENT_AMBULATORY_CARE_PROVIDER_SITE_OTHER): Payer: PPO | Admitting: Physician Assistant

## 2015-05-09 VITALS — BP 152/80 | HR 80 | Temp 98.0°F | Resp 20 | Wt 201.0 lb

## 2015-05-09 DIAGNOSIS — M25551 Pain in right hip: Secondary | ICD-10-CM

## 2015-05-09 NOTE — Progress Notes (Signed)
Patient ID: GETTIE SASSONE MRN: [id number], DOB: 07-21-1945, 70 y.o. Date of Encounter: 05/09/2015, 5:26 PM    Chief Complaint:  Chief Complaint  Patient presents with  . rt groin pain x 1 month    last week has gotten much worse     HPI: 70 y.o. year old white female presents with above.   She points to her right groin-- right in the crease/joint--anterior aspect-- as area of recent symptoms.   States that it started several months ago with just mild ache. Says that over the past 2 weeks the pain has increased. Says that sometimes it feels "like electricity going through there ". States that sometimes it is difficult to even lift her right leg to walk because it causes pain in that area. Also states that she feels pain there with weightbearing. States that because of this she has been using a walker sometimes recently. Also says that if she sits for very long time (even 20 minutes or more) then she also develops pain in that region. Says when she first wakes up in the morning she does not have pain but then has pain in that area with the above positions and maneuvers.  No other complaints or concerns.     Home Meds:   Outpatient Prescriptions Prior to Visit  Medication Sig Dispense Refill  . albuterol (PROVENTIL HFA;VENTOLIN HFA) 108 (90 BASE) MCG/ACT inhaler Inhale 2 puffs into the lungs every 6 (six) hours as needed for wheezing or shortness of breath. 1 Inhaler 0  . budesonide-formoterol (SYMBICORT) 160-4.5 MCG/ACT inhaler Inhale 2 puffs into the lungs 2 (two) times daily. 1 Inhaler 3  . calcium gluconate 500 MG tablet Take 1 tablet (500 mg total) by mouth 3 (three) times daily. 30 tablet 11  . Cholecalciferol (VITAMIN D) 2000 units CAPS Take 1 capsule (2,000 Units total) by mouth daily. 90 capsule 3  . estradiol (ESTRACE) 0.5 MG tablet TAKE 1 TABLET BY MOUTH EVERY DAY 90 tablet 1  . fluticasone (FLONASE) 50 MCG/ACT nasal spray Place 2 sprays into both nostrils daily. 16 g 5    . hydrochlorothiazide (HYDRODIURIL) 25 MG tablet Take 1 tablet (25 mg total) by mouth daily. 90 tablet 3  . levocetirizine (XYZAL) 5 MG tablet TAKE 1 TABLET (5 MG TOTAL) BY MOUTH EVERY EVENING. 30 tablet 11  . omeprazole (PRILOSEC) 20 MG capsule Take 1 capsule (20 mg total) by mouth 2 (two) times daily before a meal. (Patient not taking: Reported on 12/09/2014) 28 capsule 0  . amoxicillin (AMOXIL) 500 MG tablet TAKE 2 TABLETS TWICE DAILY FOR 14 DAYS. 56 tablet 0  . clarithromycin (BIAXIN) 500 MG tablet Take 1 tablet (500 mg total) by mouth 2 (two) times daily. 28 tablet 0   No facility-administered medications prior to visit.    Allergies:  Allergies  Allergen Reactions  . Prevalite [Cholestyramine Light] Hives  . Ace Inhibitors Rash      Review of Systems: See HPI for pertinent ROS. All other ROS negative.    Physical Exam: Blood pressure 152/80, pulse 80, temperature 98 F (36.7 C), temperature source Oral, resp. rate 20, weight 201 lb (91.173 kg)., Body mass index is 33.9 kg/(m^2). General:  WNWD WF. Appears in no acute distress. Neck: Supple. No thyromegaly. No lymphadenopathy. Lungs: Clear bilaterally to auscultation without wheezes, rales, or rhonchi. Breathing is unlabored. Heart: Regular rhythm. No murmurs, rubs, or gallops. Msk:  Strength and tone normal for age. She points to anterior aspect of  right hip joint as area of pain. Pain not reproduced with palpation. Pain is reproduced with lifting the leg (flexion at the hip joint).  Extremities/Skin: Warm and dry. Neuro: Alert and oriented X 3. Moves all extremities spontaneously. Gait is normal. CNII-XII grossly in tact. Psych:  Responds to questions appropriately with a normal affect.     ASSESSMENT AND PLAN:  70 y.o. year old female with  1. Right hip pain Suspect OA of Right Hip. Will obtain hip x-ray and then will follow up with patient once I get those results -- further management pending XRay results. - DG HIP  UNILAT WITH PELVIS 2-3 VIEWS RIGHT; Future   Signed, Olean Ree [place], [place], [facility] 05/09/2015 5:26 PM

## 2015-05-10 ENCOUNTER — Ambulatory Visit
Admission: RE | Admit: 2015-05-10 | Discharge: 2015-05-10 | Disposition: A | Discharge status: Home / Self Care | Payer: Self-pay | Source: Ambulatory Visit | Attending: Physician Assistant | Admitting: Physician Assistant

## 2015-05-10 DIAGNOSIS — M25551 Pain in right hip: Secondary | ICD-10-CM

## 2015-05-10 DIAGNOSIS — M1611 Unilateral primary osteoarthritis, right hip: Secondary | ICD-10-CM | POA: Diagnosis not present

## 2015-05-12 ENCOUNTER — Other Ambulatory Visit: Payer: Self-pay | Admitting: Family Medicine

## 2015-05-12 DIAGNOSIS — M25551 Pain in right hip: Principal | ICD-10-CM

## 2015-05-12 DIAGNOSIS — R9389 Abnormal findings on diagnostic imaging of other specified body structures: Secondary | ICD-10-CM

## 2015-05-12 DIAGNOSIS — G8929 Other chronic pain: Secondary | ICD-10-CM

## 2015-05-13 ENCOUNTER — Telehealth: Payer: Self-pay | Admitting: Family Medicine

## 2015-05-13 MED ORDER — TRAMADOL HCL 50 MG PO TABS: ORAL_TABLET | ORAL | Status: DC

## 2015-05-13 NOTE — Telephone Encounter (Signed)
Can we call out tramadol 50 mg 1-2 q 8 hrs prn pain (30)  I don't think she could get here before closing.

## 2015-05-13 NOTE — Telephone Encounter (Signed)
Ortho referral in for severe hip pain.  Has lesion on hip.  Requesting some thing for pain  OTC's not helping.

## 2015-05-13 NOTE — Telephone Encounter (Signed)
RX called in and pt made aware.

## 2015-05-17 DIAGNOSIS — M1611 Unilateral primary osteoarthritis, right hip: Secondary | ICD-10-CM | POA: Diagnosis not present

## 2015-05-31 ENCOUNTER — Other Ambulatory Visit: Payer: Self-pay | Admitting: Orthopaedic Surgery

## 2015-06-02 ENCOUNTER — Encounter (HOSPITAL_COMMUNITY): Payer: Self-pay

## 2015-06-02 ENCOUNTER — Encounter (HOSPITAL_COMMUNITY)
Admission: RE | Admit: 2015-06-02 | Discharge: 2015-06-02 | Disposition: A | Discharge status: Home / Self Care | Payer: PPO | Source: Ambulatory Visit | Attending: Orthopaedic Surgery | Admitting: Orthopaedic Surgery

## 2015-06-02 DIAGNOSIS — Z01812 Encounter for preprocedural laboratory examination: Secondary | ICD-10-CM | POA: Insufficient documentation

## 2015-06-02 DIAGNOSIS — Z0183 Encounter for blood typing: Secondary | ICD-10-CM | POA: Diagnosis not present

## 2015-06-02 DIAGNOSIS — M1611 Unilateral primary osteoarthritis, right hip: Secondary | ICD-10-CM | POA: Insufficient documentation

## 2015-06-02 DIAGNOSIS — Z01818 Encounter for other preprocedural examination: Secondary | ICD-10-CM | POA: Diagnosis not present

## 2015-06-02 DIAGNOSIS — R9431 Abnormal electrocardiogram [ECG] [EKG]: Secondary | ICD-10-CM | POA: Insufficient documentation

## 2015-06-02 HISTORY — DX: Gastro-esophageal reflux disease without esophagitis: K21.9

## 2015-06-02 HISTORY — DX: Unspecified osteoarthritis, unspecified site: M19.90

## 2015-06-02 HISTORY — DX: Reserved for inherently not codable concepts without codable children: IMO0001

## 2015-06-02 LAB — CBC WITH DIFFERENTIAL/PLATELET
Basophils Absolute: 0 10*3/uL (ref 0.0–0.1)
Basophils Relative: 0 %
Eosinophils Absolute: 1 10*3/uL — ABNORMAL HIGH (ref 0.0–0.7)
Eosinophils Relative: 9 %
HCT: 44 % (ref 36.0–46.0)
HEMOGLOBIN: 14.7 g/dL (ref 12.0–15.0)
LYMPHS ABS: 3.3 10*3/uL (ref 0.7–4.0)
LYMPHS PCT: 29 %
MCH: 29.9 pg (ref 26.0–34.0)
MCHC: 33.4 g/dL (ref 30.0–36.0)
MCV: 89.6 fL (ref 78.0–100.0)
MONOS PCT: 7 %
Monocytes Absolute: 0.8 10*3/uL (ref 0.1–1.0)
NEUTROS PCT: 55 %
Neutro Abs: 6.2 10*3/uL (ref 1.7–7.7)
Platelets: 273 10*3/uL (ref 150–400)
RBC: 4.91 MIL/uL (ref 3.87–5.11)
RDW: 13.1 % (ref 11.5–15.5)
WBC: 11.3 10*3/uL — AB (ref 4.0–10.5)

## 2015-06-02 LAB — C-REACTIVE PROTEIN: CRP: 0.7 mg/dL (ref <1.0)

## 2015-06-02 LAB — SURGICAL PCR SCREEN
MRSA, PCR: NEGATIVE
Staphylococcus aureus: NEGATIVE

## 2015-06-02 LAB — COMPREHENSIVE METABOLIC PANEL
ALK PHOS: 86 U/L (ref 38–126)
ALT: 23 U/L (ref 14–54)
ANION GAP: 12 (ref 5–15)
AST: 19 U/L (ref 15–41)
Albumin: 4 g/dL (ref 3.5–5.0)
BILIRUBIN TOTAL: 0.9 mg/dL (ref 0.3–1.2)
BUN: 16 mg/dL (ref 6–20)
CALCIUM: 9.8 mg/dL (ref 8.9–10.3)
CO2: 29 mmol/L (ref 22–32)
CREATININE: 0.7 mg/dL (ref 0.44–1.00)
Chloride: 100 mmol/L — ABNORMAL LOW (ref 101–111)
Glucose, Bld: 104 mg/dL — ABNORMAL HIGH (ref 65–99)
Potassium: 3.4 mmol/L — ABNORMAL LOW (ref 3.5–5.1)
Sodium: 141 mmol/L (ref 135–145)
TOTAL PROTEIN: 7.3 g/dL (ref 6.5–8.1)

## 2015-06-02 LAB — URINALYSIS, ROUTINE W REFLEX MICROSCOPIC
BILIRUBIN URINE: NEGATIVE
Glucose, UA: NEGATIVE mg/dL
HGB URINE DIPSTICK: NEGATIVE
Ketones, ur: NEGATIVE mg/dL
Leukocytes, UA: NEGATIVE
Nitrite: NEGATIVE
PH: 6 (ref 5.0–8.0)
Protein, ur: NEGATIVE mg/dL
SPECIFIC GRAVITY, URINE: 1.023 (ref 1.005–1.030)

## 2015-06-02 LAB — PROTIME-INR
INR: 0.96 (ref 0.00–1.49)
Prothrombin Time: 13 seconds (ref 11.6–15.2)

## 2015-06-02 LAB — APTT: aPTT: 27 seconds (ref 24–37)

## 2015-06-02 LAB — ABO/RH: ABO/RH(D): O POS

## 2015-06-02 LAB — TYPE AND SCREEN
ABO/RH(D): O POS
ANTIBODY SCREEN: NEGATIVE

## 2015-06-02 LAB — SEDIMENTATION RATE: Sed Rate: 8 mm/hr (ref 0–22)

## 2015-06-02 NOTE — Pre-Procedure Instructions (Signed)
Amanda Pope  06/02/2015      [id number] [place], [place] - [address] [place] [address] [place] [address] Phone: [phone] Fax: [phone]    Your procedure is scheduled on 06/09/2015.  Report to [facility] Admitting at 10:15 A.M.  Call this number if you have problems the morning of surgery:  [phone]   Remember:  Do not eat food or drink liquids after midnight. On WEDNESDAY   Take these medicines the morning of surgery with A SIP OF WATER : Symbicort, Nexium, Estradiol   Do not wear jewelry, make-up or nail polish.  Do not wear lotions, powders, or perfumes.  You may wear deodorant.  Do not shave 48 hours prior to surgery.    Do not bring valuables to the hospital.  [facility] is not responsible for any belongings or valuables.  Contacts, dentures or bridgework may not be worn into surgery.  Leave your suitcase in the car.  After surgery it may be brought to your room.  For patients admitted to the hospital, discharge time will be determined by your treatment team.  Patients discharged the day of surgery will not be allowed to drive home.   Name and phone number of your driver:   With family Special instructions:  Special Instructions: [place] - Preparing for Surgery  Before surgery, you can play an important role.  Because skin is not sterile, your skin needs to be as free of germs as possible.  You can reduce the number of germs on you skin by washing with CHG (chlorahexidine gluconate) soap before surgery.  CHG is an antiseptic cleaner which kills germs and bonds with the skin to continue killing germs even after washing.  Please DO NOT use if you have an allergy to CHG or antibacterial soaps.  If your skin becomes reddened/irritated stop using the CHG and inform your nurse when you arrive at Short Stay.  Do not shave (including legs and underarms) for at least 48 hours prior to the first CHG  shower.  You may shave your face.  Please follow these instructions carefully:   1.  Shower with CHG Soap the night before surgery and the  morning of Surgery.  2.  If you choose to wash your hair, wash your hair first as usual with your  normal shampoo.  3.  After you shampoo, rinse your hair and body thoroughly to remove the  Shampoo.  4.  Use CHG as you would any other liquid soap.  You can apply chg directly to the skin and wash gently with scrungie or a clean washcloth.  5.  Apply the CHG Soap to your body ONLY FROM THE NECK DOWN.    Do not use on open wounds or open sores.  Avoid contact with your eyes, ears, mouth and genitals (private parts).  Wash genitals (private parts)   with your normal soap.  6.  Wash thoroughly, paying special attention to the area where your surgery will be performed.  7.  Thoroughly rinse your body with warm water from the neck down.  8.  DO NOT shower/wash with your normal soap after using and rinsing off   the CHG Soap.  9.  Pat yourself dry with a clean towel.            10.  Wear clean pajamas.            11.  Place clean sheets on your bed the night of your first shower and do not sleep with pets.  Day of Surgery  Do not apply any lotions/deodorants the morning of surgery.  Please wear clean clothes to the hospital/surgery center.  Please read over the following fact sheets that you were given. Pain Booklet, Coughing and Deep Breathing, Total Joint Packet, MRSA Information and Surgical Site Infection Prevention

## 2015-06-08 MED ORDER — TRANEXAMIC ACID 1000 MG/10ML IV SOLN
1000.0000 mg | INTRAVENOUS | Status: DC
  Filled 2015-06-08: qty 10

## 2015-06-09 ENCOUNTER — Encounter (HOSPITAL_COMMUNITY): Payer: Self-pay | Admitting: *Deleted

## 2015-06-09 ENCOUNTER — Inpatient Hospital Stay (HOSPITAL_COMMUNITY): Payer: PPO | Admitting: Anesthesiology

## 2015-06-09 ENCOUNTER — Inpatient Hospital Stay (HOSPITAL_COMMUNITY): Payer: PPO

## 2015-06-09 ENCOUNTER — Inpatient Hospital Stay (HOSPITAL_COMMUNITY)
Admission: RE | Admit: 2015-06-09 | Discharge: 2015-06-10 | DRG: 470 | Disposition: A | Discharge status: Home Health | Payer: PPO | Source: Ambulatory Visit | Attending: Orthopaedic Surgery | Admitting: Orthopaedic Surgery

## 2015-06-09 ENCOUNTER — Encounter (HOSPITAL_COMMUNITY)
Admission: RE | Disposition: A | Discharge status: Home Health | Payer: Self-pay | Source: Ambulatory Visit | Attending: Orthopaedic Surgery

## 2015-06-09 DIAGNOSIS — Z79890 Hormone replacement therapy (postmenopausal): Secondary | ICD-10-CM | POA: Diagnosis not present

## 2015-06-09 DIAGNOSIS — K219 Gastro-esophageal reflux disease without esophagitis: Secondary | ICD-10-CM | POA: Diagnosis not present

## 2015-06-09 DIAGNOSIS — Z87891 Personal history of nicotine dependence: Secondary | ICD-10-CM | POA: Diagnosis not present

## 2015-06-09 DIAGNOSIS — Z7982 Long term (current) use of aspirin: Secondary | ICD-10-CM | POA: Diagnosis not present

## 2015-06-09 DIAGNOSIS — Z96649 Presence of unspecified artificial hip joint: Secondary | ICD-10-CM

## 2015-06-09 DIAGNOSIS — Z96641 Presence of right artificial hip joint: Secondary | ICD-10-CM | POA: Diagnosis not present

## 2015-06-09 DIAGNOSIS — I10 Essential (primary) hypertension: Secondary | ICD-10-CM | POA: Diagnosis present

## 2015-06-09 DIAGNOSIS — J449 Chronic obstructive pulmonary disease, unspecified: Secondary | ICD-10-CM | POA: Diagnosis present

## 2015-06-09 DIAGNOSIS — Z7951 Long term (current) use of inhaled steroids: Secondary | ICD-10-CM

## 2015-06-09 DIAGNOSIS — M1611 Unilateral primary osteoarthritis, right hip: Secondary | ICD-10-CM | POA: Diagnosis not present

## 2015-06-09 DIAGNOSIS — Z419 Encounter for procedure for purposes other than remedying health state, unspecified: Secondary | ICD-10-CM

## 2015-06-09 DIAGNOSIS — Z79899 Other long term (current) drug therapy: Secondary | ICD-10-CM | POA: Diagnosis not present

## 2015-06-09 DIAGNOSIS — M169 Osteoarthritis of hip, unspecified: Secondary | ICD-10-CM | POA: Diagnosis not present

## 2015-06-09 DIAGNOSIS — Z471 Aftercare following joint replacement surgery: Secondary | ICD-10-CM | POA: Diagnosis not present

## 2015-06-09 HISTORY — PX: TOTAL HIP ARTHROPLASTY: SHX124

## 2015-06-09 SURGERY — ARTHROPLASTY, HIP, TOTAL, ANTERIOR APPROACH
Anesthesia: Spinal | Site: Hip | Laterality: Right

## 2015-06-09 MED ORDER — EPHEDRINE SULFATE 50 MG/ML IJ SOLN
INTRAMUSCULAR | Status: DC | PRN
  Administered 2015-06-09 (×3): 10 mg via INTRAVENOUS

## 2015-06-09 MED ORDER — SENNOSIDES-DOCUSATE SODIUM 8.6-50 MG PO TABS: 1.0000 | ORAL_TABLET | Freq: Every evening | ORAL | Status: DC | PRN

## 2015-06-09 MED ORDER — CEFAZOLIN SODIUM-DEXTROSE 2-4 GM/100ML-% IV SOLN
2.0000 g | INTRAVENOUS | Status: AC
  Administered 2015-06-09: 2 g via INTRAVENOUS

## 2015-06-09 MED ORDER — ASPIRIN EC 325 MG PO TBEC: 325.0000 mg | DELAYED_RELEASE_TABLET | Freq: Two times a day (BID) | ORAL | Status: DC

## 2015-06-09 MED ORDER — PHENYLEPHRINE HCL 10 MG/ML IJ SOLN
INTRAMUSCULAR | Status: DC | PRN
  Administered 2015-06-09 (×2): 40 ug via INTRAVENOUS

## 2015-06-09 MED ORDER — MOMETASONE FURO-FORMOTEROL FUM 200-5 MCG/ACT IN AERO
2.0000 | INHALATION_SPRAY | Freq: Two times a day (BID) | RESPIRATORY_TRACT | Status: DC
  Administered 2015-06-10: 2 via RESPIRATORY_TRACT
  Filled 2015-06-09 (×2): qty 8.8

## 2015-06-09 MED ORDER — TRANEXAMIC ACID 1000 MG/10ML IV SOLN
1000.0000 mg | Freq: Once | INTRAVENOUS | Status: AC
  Administered 2015-06-10: 1000 mg via INTRAVENOUS
  Filled 2015-06-09 (×2): qty 10

## 2015-06-09 MED ORDER — HYDROMORPHONE HCL 1 MG/ML IJ SOLN
0.2500 mg | INTRAMUSCULAR | Status: DC | PRN
  Administered 2015-06-09 (×2): 0.5 mg via INTRAVENOUS

## 2015-06-09 MED ORDER — DEXAMETHASONE SODIUM PHOSPHATE 10 MG/ML IJ SOLN
10.0000 mg | Freq: Once | INTRAMUSCULAR | Status: DC
  Filled 2015-06-09: qty 1

## 2015-06-09 MED ORDER — VITAMIN D 1000 UNITS PO TABS
2000.0000 [IU] | ORAL_TABLET | Freq: Every day | ORAL | Status: DC
  Administered 2015-06-09 – 2015-06-10 (×2): 2000 [IU] via ORAL
  Filled 2015-06-09 (×2): qty 2

## 2015-06-09 MED ORDER — LORATADINE 10 MG PO TABS
10.0000 mg | ORAL_TABLET | Freq: Every evening | ORAL | Status: DC
  Administered 2015-06-09: 10 mg via ORAL
  Filled 2015-06-09: qty 1

## 2015-06-09 MED ORDER — PROCHLORPERAZINE EDISYLATE 5 MG/ML IJ SOLN
10.0000 mg | Freq: Once | INTRAMUSCULAR | Status: AC | PRN
Start: 2015-06-09 — End: 2015-06-09
  Administered 2015-06-09: 10 mg via INTRAVENOUS

## 2015-06-09 MED ORDER — ESTRADIOL 1 MG PO TABS
0.5000 mg | ORAL_TABLET | Freq: Every day | ORAL | Status: DC
  Administered 2015-06-10 (×2): 0.5 mg via ORAL
  Filled 2015-06-09 (×3): qty 0.5

## 2015-06-09 MED ORDER — DIPHENHYDRAMINE HCL 12.5 MG/5ML PO ELIX: 25.0000 mg | ORAL_SOLUTION | ORAL | Status: DC | PRN

## 2015-06-09 MED ORDER — 0.9 % SODIUM CHLORIDE (POUR BTL) OPTIME
TOPICAL | Status: DC | PRN
  Administered 2015-06-09: 1000 mL

## 2015-06-09 MED ORDER — MORPHINE SULFATE (PF) 2 MG/ML IV SOLN: 1.0000 mg | INTRAVENOUS | Status: DC | PRN

## 2015-06-09 MED ORDER — KETOROLAC TROMETHAMINE 30 MG/ML IJ SOLN: 30.0000 mg | Freq: Four times a day (QID) | INTRAMUSCULAR | Status: DC | PRN

## 2015-06-09 MED ORDER — LACTATED RINGERS IV SOLN
INTRAVENOUS | Status: DC
  Administered 2015-06-09 (×3): via INTRAVENOUS

## 2015-06-09 MED ORDER — HYDROCHLOROTHIAZIDE 25 MG PO TABS
25.0000 mg | ORAL_TABLET | Freq: Every day | ORAL | Status: DC
  Administered 2015-06-09 – 2015-06-10 (×2): 25 mg via ORAL
  Filled 2015-06-09 (×2): qty 1

## 2015-06-09 MED ORDER — EPHEDRINE 5 MG/ML INJ
INTRAVENOUS | Status: AC
  Filled 2015-06-09: qty 10

## 2015-06-09 MED ORDER — ALBUTEROL SULFATE (2.5 MG/3ML) 0.083% IN NEBU: 2.5000 mg | INHALATION_SOLUTION | Freq: Four times a day (QID) | RESPIRATORY_TRACT | Status: DC | PRN

## 2015-06-09 MED ORDER — SODIUM CHLORIDE 0.9 % IV SOLN
INTRAVENOUS | Status: DC
  Administered 2015-06-09: 21:00:00 via INTRAVENOUS

## 2015-06-09 MED ORDER — ONDANSETRON HCL 4 MG PO TABS: 4.0000 mg | ORAL_TABLET | Freq: Four times a day (QID) | ORAL | Status: DC | PRN

## 2015-06-09 MED ORDER — MENTHOL 3 MG MT LOZG: 1.0000 | LOZENGE | OROMUCOSAL | Status: DC | PRN

## 2015-06-09 MED ORDER — PROCHLORPERAZINE EDISYLATE 5 MG/ML IJ SOLN
INTRAMUSCULAR | Status: AC
  Filled 2015-06-09: qty 2

## 2015-06-09 MED ORDER — FENTANYL CITRATE (PF) 250 MCG/5ML IJ SOLN
INTRAMUSCULAR | Status: AC
  Filled 2015-06-09: qty 5

## 2015-06-09 MED ORDER — PANTOPRAZOLE SODIUM 40 MG PO TBEC
40.0000 mg | DELAYED_RELEASE_TABLET | Freq: Every day | ORAL | Status: DC
  Administered 2015-06-10: 40 mg via ORAL
  Filled 2015-06-09: qty 1

## 2015-06-09 MED ORDER — ACETAMINOPHEN 650 MG RE SUPP: 650.0000 mg | Freq: Four times a day (QID) | RECTAL | Status: DC | PRN

## 2015-06-09 MED ORDER — OXYCODONE HCL 5 MG PO TABS: 5.0000 mg | ORAL_TABLET | ORAL | Status: DC | PRN

## 2015-06-09 MED ORDER — ACETAMINOPHEN 500 MG PO TABS
1000.0000 mg | ORAL_TABLET | Freq: Four times a day (QID) | ORAL | Status: DC
  Administered 2015-06-09 – 2015-06-10 (×2): 1000 mg via ORAL
  Filled 2015-06-09 (×2): qty 2

## 2015-06-09 MED ORDER — METHOCARBAMOL 500 MG PO TABS
500.0000 mg | ORAL_TABLET | Freq: Four times a day (QID) | ORAL | Status: DC | PRN
  Administered 2015-06-10 (×2): 500 mg via ORAL
  Filled 2015-06-09 (×2): qty 1

## 2015-06-09 MED ORDER — OXYCODONE HCL 5 MG PO TABS
5.0000 mg | ORAL_TABLET | ORAL | Status: DC | PRN
  Administered 2015-06-09 – 2015-06-10 (×3): 10 mg via ORAL
  Filled 2015-06-09 (×3): qty 2

## 2015-06-09 MED ORDER — BUPIVACAINE LIPOSOME 1.3 % IJ SUSP
INTRAMUSCULAR | Status: DC | PRN
  Administered 2015-06-09: 20 mL

## 2015-06-09 MED ORDER — MIDAZOLAM HCL 2 MG/2ML IJ SOLN
INTRAMUSCULAR | Status: AC
  Filled 2015-06-09: qty 2

## 2015-06-09 MED ORDER — PHENOL 1.4 % MT LIQD: 1.0000 | OROMUCOSAL | Status: DC | PRN

## 2015-06-09 MED ORDER — FLUTICASONE PROPIONATE 50 MCG/ACT NA SUSP
2.0000 | Freq: Every day | NASAL | Status: DC
  Filled 2015-06-09: qty 16

## 2015-06-09 MED ORDER — ASPIRIN EC 325 MG PO TBEC
325.0000 mg | DELAYED_RELEASE_TABLET | Freq: Two times a day (BID) | ORAL | Status: DC
  Administered 2015-06-09 – 2015-06-10 (×2): 325 mg via ORAL
  Filled 2015-06-09 (×2): qty 1

## 2015-06-09 MED ORDER — POLYETHYLENE GLYCOL 3350 17 G PO PACK
17.0000 g | PACK | Freq: Every day | ORAL | Status: DC | PRN
  Filled 2015-06-09: qty 1

## 2015-06-09 MED ORDER — BUPIVACAINE LIPOSOME 1.3 % IJ SUSP
20.0000 mL | INTRAMUSCULAR | Status: DC
  Filled 2015-06-09: qty 20

## 2015-06-09 MED ORDER — METOCLOPRAMIDE HCL 5 MG PO TABS: 5.0000 mg | ORAL_TABLET | Freq: Three times a day (TID) | ORAL | Status: DC | PRN

## 2015-06-09 MED ORDER — CHLORHEXIDINE GLUCONATE 4 % EX LIQD: 60.0000 mL | Freq: Once | CUTANEOUS | Status: DC

## 2015-06-09 MED ORDER — ACETAMINOPHEN 325 MG PO TABS: 650.0000 mg | ORAL_TABLET | Freq: Four times a day (QID) | ORAL | Status: DC | PRN

## 2015-06-09 MED ORDER — OXYCODONE HCL ER 10 MG PO T12A: 10.0000 mg | EXTENDED_RELEASE_TABLET | Freq: Two times a day (BID) | ORAL | Status: DC

## 2015-06-09 MED ORDER — METHOCARBAMOL 1000 MG/10ML IJ SOLN
500.0000 mg | Freq: Four times a day (QID) | INTRAVENOUS | Status: DC | PRN
  Filled 2015-06-09: qty 5

## 2015-06-09 MED ORDER — ONDANSETRON HCL 4 MG PO TABS: 4.0000 mg | ORAL_TABLET | Freq: Three times a day (TID) | ORAL | Status: DC | PRN

## 2015-06-09 MED ORDER — PHENYLEPHRINE 40 MCG/ML (10ML) SYRINGE FOR IV PUSH (FOR BLOOD PRESSURE SUPPORT)
PREFILLED_SYRINGE | INTRAVENOUS | Status: AC
  Filled 2015-06-09: qty 10

## 2015-06-09 MED ORDER — POVIDONE-IODINE 10 % EX SOLN
CUTANEOUS | Status: DC | PRN
  Administered 2015-06-09: 17.5 via TOPICAL

## 2015-06-09 MED ORDER — SODIUM CHLORIDE 0.9 % IR SOLN
Status: DC | PRN
  Administered 2015-06-09: 3000 mL

## 2015-06-09 MED ORDER — HYDROMORPHONE HCL 1 MG/ML IJ SOLN
INTRAMUSCULAR | Status: AC
  Filled 2015-06-09: qty 1

## 2015-06-09 MED ORDER — FENTANYL CITRATE (PF) 100 MCG/2ML IJ SOLN
INTRAMUSCULAR | Status: DC | PRN
  Administered 2015-06-09 (×5): 50 ug via INTRAVENOUS

## 2015-06-09 MED ORDER — METHOCARBAMOL 750 MG PO TABS: 750.0000 mg | ORAL_TABLET | Freq: Two times a day (BID) | ORAL | Status: DC | PRN

## 2015-06-09 MED ORDER — DEXTROSE 5 % IV SOLN
INTRAVENOUS | Status: DC | PRN
  Administered 2015-06-09: 13:00:00 via INTRAVENOUS

## 2015-06-09 MED ORDER — CALCIUM GLUCONATE 500 MG PO TABS
1.0000 | ORAL_TABLET | Freq: Three times a day (TID) | ORAL | Status: DC
  Filled 2015-06-09 (×3): qty 1

## 2015-06-09 MED ORDER — TRANEXAMIC ACID 1000 MG/10ML IV SOLN
2000.0000 mg | INTRAVENOUS | Status: AC
  Administered 2015-06-09: 1000 mg via TOPICAL
  Filled 2015-06-09: qty 20

## 2015-06-09 MED ORDER — ONDANSETRON HCL 4 MG/2ML IJ SOLN
INTRAMUSCULAR | Status: DC | PRN
  Administered 2015-06-09: 4 mg via INTRAVENOUS

## 2015-06-09 MED ORDER — ONDANSETRON HCL 4 MG/2ML IJ SOLN
4.0000 mg | Freq: Four times a day (QID) | INTRAMUSCULAR | Status: DC | PRN
  Administered 2015-06-10: 4 mg via INTRAVENOUS
  Filled 2015-06-09: qty 2

## 2015-06-09 MED ORDER — OXYCODONE HCL ER 10 MG PO T12A
10.0000 mg | EXTENDED_RELEASE_TABLET | Freq: Two times a day (BID) | ORAL | Status: DC
  Administered 2015-06-09 – 2015-06-10 (×2): 10 mg via ORAL
  Filled 2015-06-09 (×2): qty 1

## 2015-06-09 MED ORDER — CEFAZOLIN SODIUM-DEXTROSE 2-4 GM/100ML-% IV SOLN
2.0000 g | Freq: Four times a day (QID) | INTRAVENOUS | Status: AC
  Administered 2015-06-10 (×2): 2 g via INTRAVENOUS
  Filled 2015-06-09 (×3): qty 100

## 2015-06-09 MED ORDER — SODIUM CHLORIDE 0.9 % IJ SOLN
INTRAMUSCULAR | Status: DC | PRN
  Administered 2015-06-09: 40 mL

## 2015-06-09 MED ORDER — ALUM & MAG HYDROXIDE-SIMETH 200-200-20 MG/5ML PO SUSP: 30.0000 mL | ORAL | Status: DC | PRN

## 2015-06-09 MED ORDER — MAGNESIUM CITRATE PO SOLN: 1.0000 | Freq: Once | ORAL | Status: DC | PRN

## 2015-06-09 MED ORDER — PROPOFOL 500 MG/50ML IV EMUL
INTRAVENOUS | Status: DC | PRN
  Administered 2015-06-09: 25 ug/kg/min via INTRAVENOUS

## 2015-06-09 MED ORDER — BUPIVACAINE IN DEXTROSE 0.75-8.25 % IT SOLN
INTRATHECAL | Status: DC | PRN
  Administered 2015-06-09: 2 mL via INTRATHECAL

## 2015-06-09 MED ORDER — METOCLOPRAMIDE HCL 5 MG/ML IJ SOLN: 5.0000 mg | Freq: Three times a day (TID) | INTRAMUSCULAR | Status: DC | PRN

## 2015-06-09 MED ORDER — MIDAZOLAM HCL 5 MG/5ML IJ SOLN
INTRAMUSCULAR | Status: DC | PRN
  Administered 2015-06-09 (×2): 1 mg via INTRAVENOUS

## 2015-06-09 MED ORDER — SODIUM CHLORIDE 0.9 % IV SOLN
2000.0000 mg | INTRAVENOUS | Status: DC | PRN
  Administered 2015-06-09: 2000 mg via TOPICAL

## 2015-06-09 MED ORDER — SORBITOL 70 % SOLN: 30.0000 mL | Freq: Every day | Status: DC | PRN

## 2015-06-09 MED ORDER — CEFAZOLIN SODIUM-DEXTROSE 2-4 GM/100ML-% IV SOLN
INTRAVENOUS | Status: AC
  Filled 2015-06-09: qty 100

## 2015-06-09 SURGICAL SUPPLY — 51 items
BAG DECANTER FOR FLEXI CONT (MISCELLANEOUS) ×3 IMPLANT
CAPT HIP TOTAL 2 ×3 IMPLANT
CELLS DAT CNTRL 66122 CELL SVR (MISCELLANEOUS) ×1 IMPLANT
COVER SURGICAL LIGHT HANDLE (MISCELLANEOUS) ×3 IMPLANT
DECANTER SPIKE VIAL GLASS SM (MISCELLANEOUS) ×3 IMPLANT
DRAPE C-ARM 42X72 X-RAY (DRAPES) ×3 IMPLANT
DRAPE STERI IOBAN 125X83 (DRAPES) ×3 IMPLANT
DRAPE U-SHAPE 47X51 STRL (DRAPES) ×9 IMPLANT
DRSG AQUACEL AG ADV 3.5X10 (GAUZE/BANDAGES/DRESSINGS) ×3 IMPLANT
ELECT BLADE 4.0 EZ CLEAN MEGAD (MISCELLANEOUS) ×3
ELECT REM PT RETURN 9FT ADLT (ELECTROSURGICAL) ×3
ELECTRODE BLDE 4.0 EZ CLN MEGD (MISCELLANEOUS) ×1 IMPLANT
ELECTRODE REM PT RTRN 9FT ADLT (ELECTROSURGICAL) ×1 IMPLANT
GLOVE BIO SURGEON STRL SZ7 (GLOVE) ×6 IMPLANT
GLOVE BIOGEL PI IND STRL 7.0 (GLOVE) ×2 IMPLANT
GLOVE BIOGEL PI INDICATOR 7.0 (GLOVE) ×4
GLOVE SKINSENSE NS SZ7.5 (GLOVE) ×2
GLOVE SKINSENSE STRL SZ7.5 (GLOVE) ×1 IMPLANT
GLOVE SURG SS PI 8.0 STRL IVOR (GLOVE) ×9 IMPLANT
GLOVE SURG SYN 7.5  E (GLOVE) ×2
GLOVE SURG SYN 7.5 E (GLOVE) ×1 IMPLANT
GOWN SRG XL XLNG 56XLVL 4 (GOWN DISPOSABLE) ×1 IMPLANT
GOWN STRL NON-REIN XL XLG LVL4 (GOWN DISPOSABLE) ×2
GOWN STRL REUS W/ TWL LRG LVL3 (GOWN DISPOSABLE) IMPLANT
GOWN STRL REUS W/TWL LRG LVL3 (GOWN DISPOSABLE)
HANDPIECE INTERPULSE COAX TIP (DISPOSABLE) ×2
IV NS 1000ML (IV SOLUTION) ×2
IV NS 1000ML BAXH (IV SOLUTION) ×1 IMPLANT
IV NS IRRIG 3000ML ARTHROMATIC (IV SOLUTION) ×3 IMPLANT
KIT BASIN OR (CUSTOM PROCEDURE TRAY) ×3 IMPLANT
MARKER SKIN DUAL TIP RULER LAB (MISCELLANEOUS) ×3 IMPLANT
NEEDLE SPNL 18GX3.5 QUINCKE PK (NEEDLE) ×3 IMPLANT
PACK TOTAL JOINT (CUSTOM PROCEDURE TRAY) ×3 IMPLANT
PACK UNIVERSAL I (CUSTOM PROCEDURE TRAY) ×3 IMPLANT
RTRCTR WOUND ALEXIS 18CM MED (MISCELLANEOUS) ×3
SAW OSC TIP CART 19.5X105X1.3 (SAW) ×3 IMPLANT
SEALER BIPOLAR AQUA 6.0 (INSTRUMENTS) ×3 IMPLANT
SET HNDPC FAN SPRY TIP SCT (DISPOSABLE) ×1 IMPLANT
SUT ETHIBOND 2 V 37 (SUTURE) ×3 IMPLANT
SUT ETHIBOND NAB CT1 #1 30IN (SUTURE) ×9 IMPLANT
SUT MNCRL AB 4-0 PS2 18 (SUTURE) ×3 IMPLANT
SUT VIC AB 0 CT1 27 (SUTURE) ×4
SUT VIC AB 0 CT1 27XBRD ANBCTR (SUTURE) ×2 IMPLANT
SUT VIC AB 1 CT1 27 (SUTURE) ×2
SUT VIC AB 1 CT1 27XBRD ANBCTR (SUTURE) ×1 IMPLANT
SUT VIC AB 2-0 CT1 27 (SUTURE) ×2
SUT VIC AB 2-0 CT1 TAPERPNT 27 (SUTURE) ×1 IMPLANT
SYRINGE 20CC LL (MISCELLANEOUS) ×3 IMPLANT
SYRINGE 60CC LL (MISCELLANEOUS) ×3 IMPLANT
TOWEL OR 17X26 10 PK STRL BLUE (TOWEL DISPOSABLE) ×3 IMPLANT
TRAY CATH 16FR W/PLASTIC CATH (SET/KITS/TRAYS/PACK) ×3 IMPLANT

## 2015-06-09 NOTE — H&P (Signed)
PREOPERATIVE H&P  Chief Complaint: right hip osteoarthritis  HPI: Amanda Pope is a 70 y.o. female who presents for surgical treatment of right hip osteoarthritis.  She denies any changes in medical history.  Past Medical History  Diagnosis Date  . Cystocele   . Hypertension   . Depression   . History of Helicobacter pylori infection   . Vitamin D deficiency   . COPD (chronic obstructive pulmonary disease) ([place])   . Urticaria   . Asthma   . Shortness of breath dyspnea   . GERD (gastroesophageal reflux disease)   . Arthritis     hip & back   Past Surgical History  Procedure Laterality Date  . Cholecystectomy  05/2007  . Appendectomy    . Abdominal hysterectomy      And Removed 1 ovary  . Tonsillectomy    . Tubal ligation     Social History   Social History  . Marital Status: Married    Spouse Name: N/A  . Number of Children: N/A  . Years of Education: N/A   Social History Main Topics  . Smoking status: Former Smoker -- 1.00 packs/day    Types: Cigarettes    Start date: 01/29/1965    Quit date: 10/30/2010  . Smokeless tobacco: Never Used  . Alcohol Use: No  . Drug Use: No  . Sexual Activity: Not on file   Other Topics Concern  . Not on file   Social History Narrative   Entered 07/2013:   Is retired. 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   Married.   Family History  Problem Relation Age of Onset  . Cancer Mother     colon  . Cancer Sister     ovarian  . Cancer Brother     melanoma  . Cancer Brother 42    colon cancer   Allergies  Allergen Reactions  . Prevalite [Cholestyramine Light] Hives  . Ace Inhibitors Rash   Prior to Admission medications   Medication Sig Start Date End Date Taking? Authorizing Provider  albuterol (PROVENTIL HFA;VENTOLIN HFA) 108 (90 BASE) MCG/ACT inhaler Inhale 2 puffs into the lungs every 6 (six) hours as needed for wheezing or shortness of breath. 09/30/14  Yes [name], MD  aspirin EC 81 MG tablet Take 81 mg  by mouth daily.   Yes Historical Provider, MD  budesonide-formoterol (SYMBICORT) 160-4.5 MCG/ACT inhaler Inhale 2 puffs into the lungs 2 (two) times daily. 11/30/14  Yes Adelina Mings, MD  Cholecalciferol (VITAMIN D) 2000 units CAPS Take 1 capsule (2,000 Units total) by mouth daily. 02/28/15  Yes Mary B Dixon, PA-C  Esomeprazole Magnesium (NEXIUM PO) Take 1 capsule by mouth every other day. OTC   Yes Historical Provider, MD  estradiol (ESTRACE) 0.5 MG tablet TAKE 1 TABLET BY MOUTH EVERY DAY 03/14/15  Yes Orlena Sheldon, PA-C  hydrochlorothiazide (HYDRODIURIL) 25 MG tablet Take 1 tablet (25 mg total) by mouth daily. 11/18/14  Yes [name], MD  levocetirizine (XYZAL) 5 MG tablet TAKE 1 TABLET (5 MG TOTAL) BY MOUTH EVERY EVENING. Patient taking differently: TAKE 1 TABLET (5 MG TOTAL) BY MOUTH EVERY MORNING 04/25/15  Yes [name], MD  calcium gluconate 500 MG tablet Take 1 tablet (500 mg total) by mouth 3 (three) times daily. Patient not taking: Reported on 06/01/2015 08/23/14   Orlena Sheldon, PA-C  fluticasone [facility]) 50 MCG/ACT nasal spray Place 2 sprays into both nostrils daily. Patient not taking: Reported  on 06/01/2015 11/30/14   Adelina Mings, MD  ibuprofen (ADVIL,MOTRIN) 200 MG tablet Take 800 mg by mouth every 8 (eight) hours as needed.    Historical Provider, MD  omeprazole (PRILOSEC) 20 MG capsule Take 1 capsule (20 mg total) by mouth 2 (two) times daily before a meal. Patient not taking: Reported on 12/09/2014 12/07/14   Adelina Mings, MD  traMADol (ULTRAM) 50 MG tablet 1-2 tabs Q 8 hrs as needed for pain Patient not taking: Reported on 06/01/2015 05/13/15   [name], MD     Positive ROS: All other systems have been reviewed and were otherwise negative with the exception of those mentioned in the HPI and as above.  Physical Exam: General: Alert, no acute distress Cardiovascular: No pedal edema Respiratory: No cyanosis, no use of accessory musculature GI:  abdomen soft Skin: No lesions in the area of chief complaint Neurologic: Sensation intact distally Psychiatric: Patient is competent for consent with normal mood and affect Lymphatic: no lymphedema  MUSCULOSKELETAL: exam stable  Assessment: right hip osteoarthritis  Plan: Plan for Procedure(s): RIGHT TOTAL HIP ARTHROPLASTY ANTERIOR APPROACH  The risks benefits and alternatives were discussed with the patient including but not limited to the risks of nonoperative treatment, versus surgical intervention including infection, bleeding, nerve injury,  blood clots, cardiopulmonary complications, morbidity, mortality, among others, and they were willing to proceed.   Marianna Payment, MD   06/09/2015 7:01 AM

## 2015-06-09 NOTE — Op Note (Signed)
RIGHT TOTAL HIP ARTHROPLASTY ANTERIOR APPROACH  Procedure Note Amanda Pope   [id number]  Pre-op Diagnosis: right hip osteoarthritis     Post-op Diagnosis: same   Operative Procedures  1. Total hip replacement; Right hip; uncemented [id number]   Personnel  Surgeon(s): [name], MD   Anesthesia: spinal, regional  Prosthesis: Depuy Acetabulum: Pinnacle 52 mm Femur: Corail KA 10 Head: 32 mm size: +5 Liner: +4 Bearing Type: Ceramic on poly  Date of Service: 06/09/2015  Total Hip Arthroplasty (Anterior Approach) Op Note:  After informed consent was obtained and the operative extremity marked in the holding area, the patient was brought back to the operating room and placed supine on the HANA table. Next, the operative extremity was prepped and draped in normal sterile fashion. Surgical timeout occurred verifying patient identification, surgical site, surgical procedure and administration of antibiotics.  A modified anterior Smith-Peterson approach to the hip was performed, using the interval between tensor fascia lata and sartorius.  Dissection was carried bluntly down onto the anterior hip capsule. The lateral femoral circumflex vessels were identified and coagulated. A capsulotomy was performed and the capsular flaps tagged for later repair.  Fluoroscopy was utilized to prepare for the femoral neck cut. The neck osteotomy was performed. The femoral head was removed, the acetabular rim was cleared of soft tissue and attention was turned to reaming the acetabulum.  Sequential reaming was performed under fluoroscopic guidance. We reamed to a size 51 mm, and then impacted the acetabular shell. The liner was then placed after irrigation and attention turned to the femur.  After placing the femoral hook, the leg was taken to externally rotated, extended and adducted position taking care to perform soft tissue releases to allow for adequate mobilization of the femur. Soft tissue was cleared  from the shoulder of the greater trochanter and the hook elevator used to improve exposure of the proximal femur. Sequential broaching performed up to a size 10. Trial neck and head were placed. The leg was brought back up to neutral and the construct reduced. The position and sizing of components, offset and leg lengths were checked using fluoroscopy. Stability of the construct was checked in extension and external rotation without any subluxation or impingement of prosthesis. We dislocated the prosthesis, dropped the leg back into position, removed trial components, and irrigated copiously. The final stem and head was then placed, the leg brought back up, the system reduced and fluoroscopy used to verify positioning.  We irrigated, obtained hemostasis and closed the capsule using #2 ethibond suture.  Dilute betadyne solution was used. The fascia was closed with #1 vicryl plus, the deep fat layer was closed with 0 vicryl, the subcutaneous layers closed with 2.0 Vicryl Plus and the skin closed with 4.0 monocryl and steri strips. A sterile dressing was applied. The patient was awakened in the operating room and taken to recovery in stable condition.  All sponge, needle, and instrument counts were correct at the end of the case.   Position: supine  Complications: none.  Time Out: performed   Drains/Packing: none  Estimated blood loss: 150 cc  Returned to Recovery Room: in good condition.   Antibiotics: yes   Mechanical VTE (DVT) Prophylaxis: sequential compression devices, TED thigh-high  Chemical VTE (DVT) Prophylaxis: aspirin   Fluid Replacement: see anesthesia record  Specimens Removed: 1 to pathology   Sponge and Instrument Count Correct? yes   PACU: portable radiograph - low AP   Admission: inpatient status, start PT &  OT POD#1  Plan/RTC: Return in 2 weeks for staple removal. Return in 6 weeks to see MD.  Weight Bearing/Load Lower Extremity: full  Hip precautions: none Suture  Removal: 10-14 days  Betadine to incision twice daily once dressing is removed on POD#7  N. Eduard Roux, MD [facility] [phone] 3:27 PM      Implant Name Type Inv. Item Serial No. Manufacturer Lot No. LRB No. Used  LINER NEUTRAL [id number] PLUS 4 - [id number] Liner LINER NEUTRAL 52X36MM PLUS 4  DEPUY [id number] Right 1  PIN SECTOR W/GRIP ACE CUP 52MM - [id number] Hips PIN SECTOR W/GRIP ACE CUP 52MM  DEPUY [id number] Right 1  STEM CORAIL KA10 - [id number] Stem STEM CORAIL KA10  DEPUY [id number] Right 1  HEAD CERAMIC 36 PLUS5 - [id number] Hips HEAD CERAMIC 36 PLUS5   DEPUY [id number] Right 1

## 2015-06-09 NOTE — Anesthesia Preprocedure Evaluation (Addendum)
Anesthesia Evaluation  Patient identified by MRN, date of birth, ID band Patient awake    Reviewed: Allergy & Precautions, NPO status , Patient's Chart, lab work & pertinent test results  Airway Mallampati: II  TM Distance: >3 FB Neck ROM: Full    Dental no notable dental hx.    Pulmonary shortness of breath, asthma , COPD, former smoker,    Pulmonary exam normal breath sounds clear to auscultation       Cardiovascular hypertension, Pt. on medications Normal cardiovascular exam Rhythm:Regular Rate:Normal     Neuro/Psych negative neurological ROS  negative psych ROS   GI/Hepatic Neg liver ROS, GERD  Medicated,  Endo/Other  Hypothyroidism Morbid obesity  Renal/GU negative Renal ROS  negative genitourinary   Musculoskeletal  (+) Arthritis ,   Abdominal (+) + obese,   Peds negative pediatric ROS (+)  Hematology negative hematology ROS (+)   Anesthesia Other Findings   Reproductive/Obstetrics negative OB ROS                            Anesthesia Physical Anesthesia Plan  ASA: II  Anesthesia Plan: Spinal   Post-op Pain Management:    Induction: Intravenous  Airway Management Planned: Natural Airway  Additional Equipment:   Intra-op Plan:   Post-operative Plan: Extubation in OR  Informed Consent: I have reviewed the patients History and Physical, chart, labs and discussed the procedure including the risks, benefits and alternatives for the proposed anesthesia with the patient or authorized representative who has indicated his/her understanding and acceptance.   Dental advisory given  Plan Discussed with: CRNA  Anesthesia Plan Comments: (Discussed risks and benefits of and differences between spinal and general.  Patient consents to spinal. Questions answered. Coagulation studies and platelet count acceptable.)      Anesthesia Quick Evaluation

## 2015-06-09 NOTE — Transfer of Care (Signed)
Immediate Anesthesia Transfer of Care Note  Patient: Amanda Pope  Procedure(s) Performed: Procedure(s): RIGHT TOTAL HIP ARTHROPLASTY ANTERIOR APPROACH (Right)  Patient Location: PACU  Anesthesia Type:Spinal  Level of Consciousness: awake, alert , oriented, patient cooperative and responds to stimulation  Airway & Oxygen Therapy: Patient Spontanous Breathing  Post-op Assessment: Report given to RN and Post -op Vital signs reviewed and stable  Post vital signs: Reviewed and stable  Last Vitals:  Filed Vitals:   06/09/15 1030  BP: 176/74  Pulse: 66  Temp: 36.6 C    Last Pain: There were no vitals filed for this visit.       Complications: No apparent anesthesia complications

## 2015-06-09 NOTE — Anesthesia Postprocedure Evaluation (Signed)
Anesthesia Post Note  Patient: Amanda Pope  Procedure(s) Performed: Procedure(s) (LRB): RIGHT TOTAL HIP ARTHROPLASTY ANTERIOR APPROACH (Right)  Patient location during evaluation: PACU Anesthesia Type: Spinal Level of consciousness: oriented and awake and alert Pain management: pain level controlled Vital Signs Assessment: post-procedure vital signs reviewed and stable Respiratory status: spontaneous breathing, respiratory function stable and patient connected to nasal cannula oxygen Cardiovascular status: blood pressure returned to baseline and stable Postop Assessment: no headache, no backache and spinal receding Anesthetic complications: no    Last Vitals:  Filed Vitals:   06/09/15 1730 06/09/15 1745  BP: 142/69 143/57  Pulse: 64 70  Temp:  36.4 C  Resp: 10 11    Last Pain:  Filed Vitals:   06/09/15 1756  PainSc: 4                  Anneka Studer J

## 2015-06-09 NOTE — Anesthesia Procedure Notes (Signed)
Spinal Patient location during procedure: OR Start time: 06/09/2015 1:10 PM Staffing Anesthesiologist: Franne Grip Performed by: anesthesiologist  Preanesthetic Checklist Completed: patient identified, site marked, surgical consent, pre-op evaluation, timeout performed, IV checked, risks and benefits discussed and monitors and equipment checked Spinal Block Patient position: sitting Prep: Betadine Patient monitoring: heart rate, continuous pulse ox, blood pressure and cardiac monitor Approach: midline Location: L3-4 Injection technique: single-shot Needle Needle type: Pencan  Needle gauge: 24 G Needle length: 9 cm Additional Notes Expiration date of kit checked and confirmed. Patient tolerated procedure well, without complications. No paresthesia.

## 2015-06-09 NOTE — Discharge Instructions (Signed)

## 2015-06-10 ENCOUNTER — Encounter (HOSPITAL_COMMUNITY): Payer: Self-pay | Admitting: General Practice

## 2015-06-10 DIAGNOSIS — R269 Unspecified abnormalities of gait and mobility: Secondary | ICD-10-CM | POA: Diagnosis not present

## 2015-06-10 LAB — CBC
HEMATOCRIT: 36.2 % (ref 36.0–46.0)
Hemoglobin: 11.8 g/dL — ABNORMAL LOW (ref 12.0–15.0)
MCH: 29.8 pg (ref 26.0–34.0)
MCHC: 32.6 g/dL (ref 30.0–36.0)
MCV: 91.4 fL (ref 78.0–100.0)
Platelets: 238 10*3/uL (ref 150–400)
RBC: 3.96 MIL/uL (ref 3.87–5.11)
RDW: 13.3 % (ref 11.5–15.5)
WBC: 13.3 10*3/uL — ABNORMAL HIGH (ref 4.0–10.5)

## 2015-06-10 LAB — BASIC METABOLIC PANEL
Anion gap: 12 (ref 5–15)
BUN: 7 mg/dL (ref 6–20)
CALCIUM: 8.7 mg/dL — AB (ref 8.9–10.3)
CO2: 27 mmol/L (ref 22–32)
CREATININE: 0.63 mg/dL (ref 0.44–1.00)
Chloride: 99 mmol/L — ABNORMAL LOW (ref 101–111)
GFR calc non Af Amer: >60 mL/min (ref >60)
Glucose, Bld: 137 mg/dL — ABNORMAL HIGH (ref 65–99)
Potassium: 3.6 mmol/L (ref 3.5–5.1)
SODIUM: 138 mmol/L (ref 135–145)

## 2015-06-10 NOTE — Progress Notes (Signed)
   Subjective:  Patient reports pain as mild.  Eager to go home today.  Objective:   VITALS:   Filed Vitals:   06/09/15 1830 06/09/15 1958 06/10/15 0021 06/10/15 0444  BP: 154/52 142/59 148/58 135/74  Pulse: 75 71 84 90  Temp: 98 F (36.7 C) 97.6 F (36.4 C) 98.8 F (37.1 C) 97.7 F (36.5 C)  TempSrc: Oral Oral Oral Oral  Resp: 12 16 14 16   Weight:      SpO2: 99% 100% 97% 97%    Neurologically intact ABD soft Neurovascular intact Sensation intact distally Intact pulses distally Dorsiflexion/Plantar flexion intact Incision: dressing C/D/I and no drainage No cellulitis present Compartment soft   Lab Results  Component Value Date   WBC 13.3* 06/10/2015   HGB 11.8* 06/10/2015   HCT 36.2 06/10/2015   MCV 91.4 06/10/2015   PLT 238 06/10/2015     Assessment/Plan:  1 Day Post-Op   - Expected postop acute blood loss anemia - will monitor for symptoms - Up with PT/OT - cleared PT - DVT ppx - SCDs, ambulation, aspirin - WBAT operative extremity - Pain control - Discharge planning - home today  Marianna Payment 06/10/2015, 10:28 AM [phone]

## 2015-06-10 NOTE — Evaluation (Signed)
Physical Therapy Evaluation Patient Details Name: Amanda Pope MRN: [id number] DOB: 1945/03/23 Today's Date: 06/10/2015   History of Present Illness  70 yo admitted for Rt anterior THA. PMHx: HTN, depression, COPD, GERD  Clinical Impression  Pt very pleasant and moving well. Pt educated for HEP, transfers, gait, stairs and RW use. Pt with decreased ability to advance RLE with cues throughout for sequence and safety with improved advancement with increased distance. Pt with decreased strength, function, gait and mobility who will benefit from acute therapy to maximize mobility and independence.     Follow Up Recommendations Home health PT    Equipment Recommendations  3in1 (PT)    Recommendations for Other Services OT consult     Precautions / Restrictions Precautions Precautions: Fall Restrictions RLE Weight Bearing: Weight bearing as tolerated      Mobility  Bed Mobility Overal bed mobility: Needs Assistance Bed Mobility: Supine to Sit     Supine to sit: Min assist     General bed mobility comments: assist to move RLE to EOB with cues for sequence and reliance on rail to elevate trunk   Transfers Overall transfer level: Needs assistance   Transfers: Sit to/from Stand Sit to Stand: Supervision         General transfer comment: cues for hand placement and safety  Ambulation/Gait Ambulation/Gait assistance: Supervision Ambulation Distance (Feet): 170 Feet Assistive device: Rolling walker (2 wheeled) Gait Pattern/deviations: Step-to pattern;Decreased stride length   Gait velocity interpretation: Below normal speed for age/gender General Gait Details: cues for sequence, posture and position in RW  Stairs Stairs: Yes Stairs assistance: Min assist Stair Management: Backwards;With walker Number of Stairs: 4 General stair comments: cues for sequence with assist to stabilize and move RW, handout provided. Attempted sideways with rail but unable  Wheelchair  Mobility    Modified Rankin (Stroke Patients Only)       Balance                                             Pertinent Vitals/Pain Pain Assessment: No/denies pain    Home Living Family/patient expects to be discharged to:: Private residence Living Arrangements: Spouse/significant other Available Help at Discharge: Family;Available 24 hours/day Type of Home: House Home Access: Stairs to enter Entrance Stairs-Rails: Psychiatric nurse of Steps: 4 Home Layout: One level Home Equipment: Environmental consultant - 2 wheels      Prior Function Level of Independence: Independent               Hand Dominance        Extremity/Trunk Assessment   Upper Extremity Assessment: Overall WFL for tasks assessed           Lower Extremity Assessment: RLE deficits/detail RLE Deficits / Details: decreased strength and ROM as expected post op    Cervical / Trunk Assessment: Normal  Communication   Communication: No difficulties  Cognition Arousal/Alertness: Awake/alert Behavior During Therapy: WFL for tasks assessed/performed Overall Cognitive Status: Within Functional Limits for tasks assessed                      General Comments      Exercises Total Joint Exercises Heel Slides: AAROM;Right;5 reps;Supine Hip ABduction/ADduction: AAROM;Right;5 reps;Supine      Assessment/Plan    PT Assessment Patient needs continued PT services  PT Diagnosis Difficulty walking;Acute pain   PT  Problem List Decreased strength;Decreased range of motion;Decreased activity tolerance;Decreased mobility;Pain;Decreased knowledge of use of DME  PT Treatment Interventions DME instruction;Gait training;Stair training;Functional mobility training;Therapeutic activities;Therapeutic exercise;Patient/family education   PT Goals (Current goals can be found in the Care Plan section) Acute Rehab PT Goals Patient Stated Goal: walk without pain PT Goal Formulation: With  patient Time For Goal Achievement: 06/17/15 Potential to Achieve Goals: Good    Frequency 7X/week   Barriers to discharge        Co-evaluation               End of Session Equipment Utilized During Treatment: Gait belt Activity Tolerance: Patient tolerated treatment well Patient left: in chair;with call bell/phone within reach Nurse Communication: Mobility status         Time: [id number] PT Time Calculation (min) (ACUTE ONLY): 36 min   Charges:   PT Evaluation $PT Eval Moderate Complexity: 1 Procedure PT Treatments $Gait Training: 8-22 mins   PT G CodesMelford Aase 06/10/2015, 9:52 AM Elwyn Reach, [place]

## 2015-06-10 NOTE — Progress Notes (Signed)
Occupational Therapy Evaluation Patient Details Name: Amanda Pope MRN: [id number] DOB: 02-02-1945 Today's Date: 06/10/2015    History of Present Illness 70 yo admitted for Rt anterior THA. PMHx: HTN, depression, COPD, GERD   Clinical Impression   Completed all education for ADL and functional mobility for ADL. Pt demonstrated understanding. Pt safe to D/C home with S for all mobility.     Follow Up Recommendations  No OT follow up;Supervision - Intermittent    Equipment Recommendations  3 in 1 bedside comode    Recommendations for Other Services       Precautions / Restrictions Precautions Precautions: Fall Restrictions RLE Weight Bearing: Weight bearing as tolerated      Mobility Bed Mobility               General bed mobility comments: OOB in chair  Transfers Overall transfer level: Needs assistance     Sit to Stand: Supervision         General transfer comment: cues for hand placement. increased time    Balance                                            ADL                                         General ADL Comments: completed education regarding compensatory techniques for ADL and use of AE if desired. Educated on shower trnafer technique. completed toilet transfer with minguard A initially, then S. Educated on home safety and reducing risk of falls. Pt demosntarted understanding.     Vision     Perception     Praxis      Pertinent Vitals/Pain Pain Assessment: 0-10 Pain Score: 3  Pain Location: R hip Pain Descriptors / Indicators: Aching Pain Intervention(s): Limited activity within patient's tolerance     Hand Dominance     Extremity/Trunk Assessment Upper Extremity Assessment Upper Extremity Assessment: Overall WFL for tasks assessed   Lower Extremity Assessment Lower Extremity Assessment: Defer to PT evaluation RLE Deficits / Details: decreased strength and ROM as expected post op    Cervical / Trunk Assessment Cervical / Trunk Assessment: Normal   Communication Communication Communication: No difficulties   Cognition Arousal/Alertness: Awake/alert Behavior During Therapy: WFL for tasks assessed/performed Overall Cognitive Status: Within Functional Limits for tasks assessed                     General Comments       Exercises       Shoulder Instructions      Home Living Family/patient expects to be discharged to:: Private residence Living Arrangements: Spouse/significant other Available Help at Discharge: Family;Available 24 hours/day Type of Home: House Home Access: Stairs to enter [employer] of Steps: 4 Entrance Stairs-Rails: Right;Left Home Layout: One level     Bathroom Shower/Tub: Occupational psychologist: Standard Bathroom Accessibility: Yes How Accessible: Accessible via walker Home Equipment: Scott - 2 wheels          Prior Functioning/Environment Level of Independence: Independent             OT Diagnosis: Generalized weakness;Acute pain   OT Problem List: Decreased strength;Decreased range of motion;Decreased activity tolerance;Decreased knowledge of use of DME or  AE;Obesity;Pain   OT Treatment/Interventions:      OT Goals(Current goals can be found in the care plan section) Acute Rehab OT Goals Patient Stated Goal: take care of myself OT Goal Formulation: All assessment and education complete, DC therapy  OT Frequency:     Barriers to D/C:            Co-evaluation              End of Session Equipment Utilized During Treatment: Gait belt;Rolling walker Nurse Communication: Mobility status  Activity Tolerance: Patient tolerated treatment well Patient left: in chair;with call bell/phone within reach   Time: 1120-1151 OT Time Calculation (min): 31 min Charges:  OT General Charges $OT Visit: 1 Procedure OT Evaluation $OT Eval Moderate Complexity: 1 Procedure OT  Treatments $Self Care/Home Management : 8-22 mins G-Codes:    Ezra Denne,[place] 06-16-2015, 5:31 PM   [facility], OTR/L  [phone] 06/16/15

## 2015-06-10 NOTE — Care Management Note (Signed)
Case Management Note  Patient Details  Name: Amanda Pope MRN: [id number] Date of Birth: 1945/11/22  Subjective/Objective:  70 yr old female s/p right anterior hip arthroplasty.                  Action/Plan:  Case manager spoke with patient concerning home health and DMe needs at discharge. [place] for home health agency was offered. Referral was called to Estrella Myrtle, [place] [facility]. Patient states se has a rolling walker, CM has ordered a 3in1. Patient will have family support at discharge.   Expected Discharge Date:    06/10/15               Expected Discharge Plan:   Home with [facility]  In-House Referral:     Discharge planning Services     Post Acute Care Choice:    Choice offered to:     DME Arranged:   3in1 DME Agency:   [facility]  HH Arranged:   PT [place] Agency:   [facility]  Status of Service:   Completed  Medicare Important Message Given:    Date Medicare IM Given:    Medicare IM give by:    Date Additional Medicare IM Given:    Additional Medicare Important Message give by:     If discussed at [place] of Stay Meetings, dates discussed:    Additional Comments:  Ninfa Meeker, RN 06/10/2015, 10:39 AM

## 2015-06-10 NOTE — Progress Notes (Signed)
Physical Therapy Progress Note  Pt continues to have difficulty with moving RLE for bed mobility. Improved gait and ability with exercises this afternoon with HEP discussed and spouse educated for stairs.     06/10/15 1320  PT Visit Information  Last PT Received On 06/10/15  Assistance Needed +1  History of Present Illness 70 yo admitted for Rt anterior THA. PMHx: HTN, depression, COPD, GERD  Precautions  Precautions Fall  Restrictions  RLE Weight Bearing WBAT  Pain Assessment  Pain Assessment 0-10  Pain Score 3  Pain Location rt hip  Pain Descriptors / Indicators Aching;Sore  Pain Intervention(s) Limited activity within patient's tolerance;Monitored during session;Premedicated before session;Repositioned  Cognition  Arousal/Alertness Awake/alert  Behavior During Therapy WFL for tasks assessed/performed  Overall Cognitive Status Within Functional Limits for tasks assessed  Bed Mobility  Overal bed mobility Needs Assistance  Bed Mobility Supine to Sit;Sit to Supine  Supine to sit Min guard  Sit to supine Min assist  General bed mobility comments (assist for RLE onto and off of surface with cues and time)  Transfers  Overall transfer level Needs assistance  Equipment used None  Sit to Stand Min guard  General transfer comment cues for hand placement and safety  Ambulation/Gait  Ambulation/Gait assistance Min guard  Ambulation Distance (Feet) 170 Feet  Assistive device Rolling walker (2 wheeled)  Gait Pattern/deviations Step-through pattern;Trunk flexed;Decreased stride length  General Gait Details cues for sequence, posture and position in RW  Gait velocity interpretation Below normal speed for age/gender  Stairs Yes  Stairs assistance Min assist  Stair Management Backwards;With walker  Number of Stairs 2  General stair comments cues for sequence with spouse assist to stabilize and move RW  Exercises  Exercises Total Joint  Total Joint Exercises  Hip  ABduction/ADduction AROM;Right;10 reps;Standing  Knee Flexion AROM;Right;10 reps;Standing  Marching in Standing AROM;Right;10 reps;Standing  Standing Hip Extension AROM;Right;10 reps;Standing  PT - End of Session  Activity Tolerance Patient tolerated treatment well  Patient left in bed;with call bell/phone within reach;with family/visitor present  PT - Assessment/Plan  PT Plan Current plan remains appropriate  Follow Up Recommendations Home health PT  PT Goal Progression  Progress towards PT goals Progressing toward goals  PT Time Calculation  PT Start Time (ACUTE ONLY) 1253  PT Stop Time (ACUTE ONLY) 1317  PT Time Calculation (min) (ACUTE ONLY) 24 min  PT General Charges  $$ ACUTE PT VISIT 1 Procedure  PT Treatments  $Gait Training 8-22 mins  $Therapeutic Exercise 8-22 mins   Elwyn Reach, [place]

## 2015-06-10 NOTE — Discharge Summary (Signed)
Physician Discharge Summary      Patient ID: Amanda Pope MRN: [id number] DOB/AGE: 68-Feb-1947 68 y.o.  Admit date: 06/09/2015 Discharge date: 06/10/2015  Admission Diagnoses:  <principal problem not specified>  Discharge Diagnoses:  Active Problems:   Osteoarthritis of right hip   Hip joint replacement status   Past Medical History  Diagnosis Date  . Cystocele   . Hypertension   . Depression   . History of Helicobacter pylori infection   . Vitamin D deficiency   . COPD (chronic obstructive pulmonary disease) ([place])   . Urticaria   . Asthma   . Shortness of breath dyspnea   . GERD (gastroesophageal reflux disease)   . Arthritis     hip & back    Surgeries: Procedure(s): RIGHT TOTAL HIP ARTHROPLASTY ANTERIOR APPROACH on 06/09/2015   Consultants (if any):    Discharged Condition: Improved  Hospital Course: Amanda Pope is an 70 y.o. female who was admitted 06/09/2015 with a diagnosis of <principal problem not specified> and went to the operating room on 06/09/2015 and underwent the above named procedures.    She was given perioperative antibiotics:  Anti-infectives    Start     Dose/Rate Route Frequency Ordered Stop   06/09/15 2100  ceFAZolin (ANCEF) IVPB 2g/100 mL premix     2 g 200 mL/hr over 30 Minutes Intravenous Every 6 hours 06/09/15 1834 06/10/15 0657   06/09/15 1023  ceFAZolin (ANCEF) 2-4 GM/100ML-% IVPB    Comments:  Ardine Eng   : cabinet override      06/09/15 1023 06/09/15 2229   06/09/15 1018  ceFAZolin (ANCEF) IVPB 2g/100 mL premix     2 g 200 mL/hr over 30 Minutes Intravenous On call to O.R. 06/09/15 1018 06/09/15 1344    .  She was given sequential compression devices, early ambulation, and aspirin for DVT prophylaxis.  She benefited maximally from the hospital stay and there were no complications.    Recent vital signs:  Filed Vitals:   06/10/15 0021 06/10/15 0444  BP: 148/58 135/74  Pulse: 84 90  Temp: 98.8 F (37.1 C) 97.7 F  (36.5 C)  Resp: 14 16    Recent laboratory studies:  Lab Results  Component Value Date   HGB 11.8* 06/10/2015   HGB 14.7 06/02/2015   HGB 14.4 08/19/2014   Lab Results  Component Value Date   WBC 13.3* 06/10/2015   PLT 238 06/10/2015   Lab Results  Component Value Date   INR 0.96 06/02/2015   Lab Results  Component Value Date   NA 138 06/10/2015   K 3.6 06/10/2015   CL 99* 06/10/2015   CO2 27 06/10/2015   BUN 7 06/10/2015   CREATININE 0.63 06/10/2015   GLUCOSE 137* 06/10/2015    Discharge Medications:     Medication List    TAKE these medications        aspirin EC 325 MG tablet  Take 1 tablet (325 mg total) by mouth 2 (two) times daily.     methocarbamol 750 MG tablet  Commonly known as:  ROBAXIN  Take 1 tablet (750 mg total) by mouth 2 (two) times daily as needed for muscle spasms.     ondansetron 4 MG tablet  Commonly known as:  ZOFRAN  Take 1-2 tablets (4-8 mg total) by mouth every 8 (eight) hours as needed for nausea or vomiting.     oxyCODONE 5 MG immediate release tablet  Commonly known as:  Oxy IR/ROXICODONE  Take  1-3 tablets (5-15 mg total) by mouth every 4 (four) hours as needed.     oxyCODONE 10 mg 12 hr tablet  Commonly known as:  OXYCONTIN  Take 1 tablet (10 mg total) by mouth every 12 (twelve) hours.     senna-docusate 8.6-50 MG tablet  Commonly known as:  SENOKOT S  Take 1 tablet by mouth at bedtime as needed.      ASK your doctor about these medications        albuterol 108 (90 Base) MCG/ACT inhaler  Commonly known as:  PROVENTIL HFA;VENTOLIN HFA  Inhale 2 puffs into the lungs every 6 (six) hours as needed for wheezing or shortness of breath.     budesonide-formoterol 160-4.5 MCG/ACT inhaler  Commonly known as:  SYMBICORT  Inhale 2 puffs into the lungs 2 (two) times daily.     calcium gluconate 500 MG tablet  Take 1 tablet (500 mg total) by mouth 3 (three) times daily.     estradiol 0.5 MG tablet  Commonly known as:  ESTRACE    TAKE 1 TABLET BY MOUTH EVERY DAY     fluticasone 50 MCG/ACT nasal spray  Commonly known as:  FLONASE  Place 2 sprays into both nostrils daily.     hydrochlorothiazide 25 MG tablet  Commonly known as:  HYDRODIURIL  Take 1 tablet (25 mg total) by mouth daily.     ibuprofen 200 MG tablet  Commonly known as:  ADVIL,MOTRIN  Take 800 mg by mouth every 8 (eight) hours as needed.     levocetirizine 5 MG tablet  Commonly known as:  XYZAL  TAKE 1 TABLET (5 MG TOTAL) BY MOUTH EVERY EVENING.     NEXIUM PO  Take 1 capsule by mouth every other day. OTC     omeprazole 20 MG capsule  Commonly known as:  PRILOSEC  Take 1 capsule (20 mg total) by mouth 2 (two) times daily before a meal.     traMADol 50 MG tablet  Commonly known as:  ULTRAM  1-2 tabs Q 8 hrs as needed for pain     Vitamin D 2000 units Caps  Take 1 capsule (2,000 Units total) by mouth daily.        Diagnostic Studies: Dg Pelvis Portable  06/09/2015  CLINICAL DATA:  Right hip replacement EXAM: PORTABLE PELVIS 1-2 VIEWS COMPARISON:  None. FINDINGS: A total right hip arthroplasty is located. No evidence of periprosthetic fracture. IMPRESSION: Unremarkable total right hip arthroplasty. Electronically Signed   By: Monte Fantasia M.D.   On: 06/09/2015 18:52   Dg Hip Operative Unilat W Or W/o Pelvis Right  06/09/2015  CLINICAL DATA:  Patient is status post total hip arthroplasty on the right EXAM: OPERATIVE right HIP   1 VIEWS TECHNIQUE: Fluoroscopic spot image(s) were submitted for interpretation post-operatively. COMPARISON:  May 10, 2015 FLUOROSCOPY TIME:  0 minutes 51 seconds, 3 submitted images FINDINGS: There is a total hip replacement on the right with prosthetic components appearing well-seated on frontal view. There is no fracture or dislocation evident. IMPRESSION: Total hip replacement on the right with prosthetic components appearing well-seated on frontal view. No fracture or dislocation evident on single view.  Electronically Signed   By: Lowella Grip III M.D.   On: 06/09/2015 15:16    Disposition:         Follow-up Information    Follow up with Marianna Payment, MD In 2 weeks.   Specialty:  Orthopedic Surgery   Why:  For suture  removal, For wound re-check   Contact information:   [address] [place] [id number] [phone]        Signed: Marianna Payment 06/10/2015, 10:29 AM

## 2015-06-11 DIAGNOSIS — F329 Major depressive disorder, single episode, unspecified: Secondary | ICD-10-CM | POA: Diagnosis not present

## 2015-06-11 DIAGNOSIS — Z471 Aftercare following joint replacement surgery: Secondary | ICD-10-CM | POA: Diagnosis not present

## 2015-06-11 DIAGNOSIS — K219 Gastro-esophageal reflux disease without esophagitis: Secondary | ICD-10-CM | POA: Diagnosis not present

## 2015-06-11 DIAGNOSIS — J449 Chronic obstructive pulmonary disease, unspecified: Secondary | ICD-10-CM | POA: Diagnosis not present

## 2015-06-11 DIAGNOSIS — Z96641 Presence of right artificial hip joint: Secondary | ICD-10-CM | POA: Diagnosis not present

## 2015-06-11 DIAGNOSIS — I10 Essential (primary) hypertension: Secondary | ICD-10-CM | POA: Diagnosis not present

## 2015-06-11 DIAGNOSIS — M1990 Unspecified osteoarthritis, unspecified site: Secondary | ICD-10-CM | POA: Diagnosis not present

## 2015-06-11 DIAGNOSIS — Z7982 Long term (current) use of aspirin: Secondary | ICD-10-CM | POA: Diagnosis not present

## 2015-06-11 DIAGNOSIS — E559 Vitamin D deficiency, unspecified: Secondary | ICD-10-CM | POA: Diagnosis not present

## 2015-06-15 DIAGNOSIS — Z96641 Presence of right artificial hip joint: Secondary | ICD-10-CM | POA: Diagnosis not present

## 2015-06-15 DIAGNOSIS — I10 Essential (primary) hypertension: Secondary | ICD-10-CM | POA: Diagnosis not present

## 2015-06-15 DIAGNOSIS — M1990 Unspecified osteoarthritis, unspecified site: Secondary | ICD-10-CM | POA: Diagnosis not present

## 2015-06-15 DIAGNOSIS — F329 Major depressive disorder, single episode, unspecified: Secondary | ICD-10-CM | POA: Diagnosis not present

## 2015-06-15 DIAGNOSIS — J449 Chronic obstructive pulmonary disease, unspecified: Secondary | ICD-10-CM | POA: Diagnosis not present

## 2015-06-15 DIAGNOSIS — Z471 Aftercare following joint replacement surgery: Secondary | ICD-10-CM | POA: Diagnosis not present

## 2015-06-15 DIAGNOSIS — E559 Vitamin D deficiency, unspecified: Secondary | ICD-10-CM | POA: Diagnosis not present

## 2015-06-15 DIAGNOSIS — Z7982 Long term (current) use of aspirin: Secondary | ICD-10-CM | POA: Diagnosis not present

## 2015-06-15 DIAGNOSIS — K219 Gastro-esophageal reflux disease without esophagitis: Secondary | ICD-10-CM | POA: Diagnosis not present

## 2015-06-20 DIAGNOSIS — M1990 Unspecified osteoarthritis, unspecified site: Secondary | ICD-10-CM | POA: Diagnosis not present

## 2015-06-20 DIAGNOSIS — J449 Chronic obstructive pulmonary disease, unspecified: Secondary | ICD-10-CM | POA: Diagnosis not present

## 2015-06-20 DIAGNOSIS — K219 Gastro-esophageal reflux disease without esophagitis: Secondary | ICD-10-CM | POA: Diagnosis not present

## 2015-06-20 DIAGNOSIS — Z96641 Presence of right artificial hip joint: Secondary | ICD-10-CM | POA: Diagnosis not present

## 2015-06-20 DIAGNOSIS — E559 Vitamin D deficiency, unspecified: Secondary | ICD-10-CM | POA: Diagnosis not present

## 2015-06-20 DIAGNOSIS — I10 Essential (primary) hypertension: Secondary | ICD-10-CM | POA: Diagnosis not present

## 2015-06-20 DIAGNOSIS — F329 Major depressive disorder, single episode, unspecified: Secondary | ICD-10-CM | POA: Diagnosis not present

## 2015-06-20 DIAGNOSIS — Z7982 Long term (current) use of aspirin: Secondary | ICD-10-CM | POA: Diagnosis not present

## 2015-06-20 DIAGNOSIS — Z471 Aftercare following joint replacement surgery: Secondary | ICD-10-CM | POA: Diagnosis not present

## 2015-06-22 DIAGNOSIS — Z7982 Long term (current) use of aspirin: Secondary | ICD-10-CM | POA: Diagnosis not present

## 2015-06-22 DIAGNOSIS — E559 Vitamin D deficiency, unspecified: Secondary | ICD-10-CM | POA: Diagnosis not present

## 2015-06-22 DIAGNOSIS — M1990 Unspecified osteoarthritis, unspecified site: Secondary | ICD-10-CM | POA: Diagnosis not present

## 2015-06-22 DIAGNOSIS — Z96641 Presence of right artificial hip joint: Secondary | ICD-10-CM | POA: Diagnosis not present

## 2015-06-22 DIAGNOSIS — Z471 Aftercare following joint replacement surgery: Secondary | ICD-10-CM | POA: Diagnosis not present

## 2015-06-22 DIAGNOSIS — F329 Major depressive disorder, single episode, unspecified: Secondary | ICD-10-CM | POA: Diagnosis not present

## 2015-06-22 DIAGNOSIS — K219 Gastro-esophageal reflux disease without esophagitis: Secondary | ICD-10-CM | POA: Diagnosis not present

## 2015-06-22 DIAGNOSIS — I10 Essential (primary) hypertension: Secondary | ICD-10-CM | POA: Diagnosis not present

## 2015-06-22 DIAGNOSIS — J449 Chronic obstructive pulmonary disease, unspecified: Secondary | ICD-10-CM | POA: Diagnosis not present

## 2015-06-23 DIAGNOSIS — M1611 Unilateral primary osteoarthritis, right hip: Secondary | ICD-10-CM | POA: Diagnosis not present

## 2015-06-29 DIAGNOSIS — I10 Essential (primary) hypertension: Secondary | ICD-10-CM | POA: Diagnosis not present

## 2015-06-29 DIAGNOSIS — Z7982 Long term (current) use of aspirin: Secondary | ICD-10-CM | POA: Diagnosis not present

## 2015-06-29 DIAGNOSIS — Z471 Aftercare following joint replacement surgery: Secondary | ICD-10-CM | POA: Diagnosis not present

## 2015-06-29 DIAGNOSIS — E559 Vitamin D deficiency, unspecified: Secondary | ICD-10-CM | POA: Diagnosis not present

## 2015-06-29 DIAGNOSIS — F329 Major depressive disorder, single episode, unspecified: Secondary | ICD-10-CM | POA: Diagnosis not present

## 2015-06-29 DIAGNOSIS — K219 Gastro-esophageal reflux disease without esophagitis: Secondary | ICD-10-CM | POA: Diagnosis not present

## 2015-06-29 DIAGNOSIS — J449 Chronic obstructive pulmonary disease, unspecified: Secondary | ICD-10-CM | POA: Diagnosis not present

## 2015-06-29 DIAGNOSIS — Z96641 Presence of right artificial hip joint: Secondary | ICD-10-CM | POA: Diagnosis not present

## 2015-06-29 DIAGNOSIS — M1990 Unspecified osteoarthritis, unspecified site: Secondary | ICD-10-CM | POA: Diagnosis not present

## 2015-07-01 DIAGNOSIS — Z471 Aftercare following joint replacement surgery: Secondary | ICD-10-CM | POA: Diagnosis not present

## 2015-07-01 DIAGNOSIS — Z96641 Presence of right artificial hip joint: Secondary | ICD-10-CM | POA: Diagnosis not present

## 2015-07-01 DIAGNOSIS — J449 Chronic obstructive pulmonary disease, unspecified: Secondary | ICD-10-CM | POA: Diagnosis not present

## 2015-07-01 DIAGNOSIS — E559 Vitamin D deficiency, unspecified: Secondary | ICD-10-CM | POA: Diagnosis not present

## 2015-07-01 DIAGNOSIS — F329 Major depressive disorder, single episode, unspecified: Secondary | ICD-10-CM | POA: Diagnosis not present

## 2015-07-01 DIAGNOSIS — I10 Essential (primary) hypertension: Secondary | ICD-10-CM | POA: Diagnosis not present

## 2015-07-01 DIAGNOSIS — Z7982 Long term (current) use of aspirin: Secondary | ICD-10-CM | POA: Diagnosis not present

## 2015-07-01 DIAGNOSIS — M1990 Unspecified osteoarthritis, unspecified site: Secondary | ICD-10-CM | POA: Diagnosis not present

## 2015-07-01 DIAGNOSIS — K219 Gastro-esophageal reflux disease without esophagitis: Secondary | ICD-10-CM | POA: Diagnosis not present

## 2015-07-06 DIAGNOSIS — Z471 Aftercare following joint replacement surgery: Secondary | ICD-10-CM | POA: Diagnosis not present

## 2015-07-06 DIAGNOSIS — Z96641 Presence of right artificial hip joint: Secondary | ICD-10-CM | POA: Diagnosis not present

## 2015-07-06 DIAGNOSIS — E559 Vitamin D deficiency, unspecified: Secondary | ICD-10-CM | POA: Diagnosis not present

## 2015-07-06 DIAGNOSIS — I10 Essential (primary) hypertension: Secondary | ICD-10-CM | POA: Diagnosis not present

## 2015-07-06 DIAGNOSIS — M1990 Unspecified osteoarthritis, unspecified site: Secondary | ICD-10-CM | POA: Diagnosis not present

## 2015-07-06 DIAGNOSIS — J449 Chronic obstructive pulmonary disease, unspecified: Secondary | ICD-10-CM | POA: Diagnosis not present

## 2015-07-06 DIAGNOSIS — Z7982 Long term (current) use of aspirin: Secondary | ICD-10-CM | POA: Diagnosis not present

## 2015-07-06 DIAGNOSIS — K219 Gastro-esophageal reflux disease without esophagitis: Secondary | ICD-10-CM | POA: Diagnosis not present

## 2015-07-06 DIAGNOSIS — F329 Major depressive disorder, single episode, unspecified: Secondary | ICD-10-CM | POA: Diagnosis not present

## 2015-07-19 DIAGNOSIS — M1611 Unilateral primary osteoarthritis, right hip: Secondary | ICD-10-CM | POA: Diagnosis not present

## 2015-08-25 ENCOUNTER — Encounter: Payer: Self-pay | Admitting: Physician Assistant

## 2015-08-25 ENCOUNTER — Ambulatory Visit (INDEPENDENT_AMBULATORY_CARE_PROVIDER_SITE_OTHER): Payer: PPO | Admitting: Physician Assistant

## 2015-08-25 VITALS — BP 144/94 | HR 76 | Temp 97.9°F | Ht 65.0 in | Wt 206.0 lb

## 2015-08-25 DIAGNOSIS — Z299 Encounter for prophylactic measures, unspecified: Secondary | ICD-10-CM | POA: Diagnosis not present

## 2015-08-25 DIAGNOSIS — K219 Gastro-esophageal reflux disease without esophagitis: Secondary | ICD-10-CM

## 2015-08-25 DIAGNOSIS — J439 Emphysema, unspecified: Secondary | ICD-10-CM

## 2015-08-25 DIAGNOSIS — Z0000 Encounter for general adult medical examination without abnormal findings: Secondary | ICD-10-CM

## 2015-08-25 DIAGNOSIS — Z23 Encounter for immunization: Secondary | ICD-10-CM

## 2015-08-25 DIAGNOSIS — E559 Vitamin D deficiency, unspecified: Secondary | ICD-10-CM | POA: Diagnosis not present

## 2015-08-25 DIAGNOSIS — I10 Essential (primary) hypertension: Secondary | ICD-10-CM

## 2015-08-25 DIAGNOSIS — E038 Other specified hypothyroidism: Secondary | ICD-10-CM

## 2015-08-25 DIAGNOSIS — R739 Hyperglycemia, unspecified: Secondary | ICD-10-CM | POA: Diagnosis not present

## 2015-08-25 DIAGNOSIS — E039 Hypothyroidism, unspecified: Secondary | ICD-10-CM

## 2015-08-25 LAB — LIPID PANEL
CHOL/HDL RATIO: 3.6 ratio (ref <=5.0)
Cholesterol: 206 mg/dL — ABNORMAL HIGH (ref 125–200)
HDL: 58 mg/dL (ref >=46)
LDL CALC: 115 mg/dL (ref <130)
TRIGLYCERIDES: 166 mg/dL — AB (ref <150)
VLDL: 33 mg/dL — ABNORMAL HIGH (ref <30)

## 2015-08-25 LAB — COMPLETE METABOLIC PANEL WITH GFR
ALT: 18 U/L (ref 6–29)
AST: 16 U/L (ref 10–35)
Albumin: 4.2 g/dL (ref 3.6–5.1)
Alkaline Phosphatase: 99 U/L (ref 33–130)
BUN: 16 mg/dL (ref 7–25)
CHLORIDE: 101 mmol/L (ref 98–110)
CO2: 26 mmol/L (ref 20–31)
Calcium: 9.3 mg/dL (ref 8.6–10.4)
Creat: 0.67 mg/dL (ref 0.50–0.99)
GFR, Est Non African American: >89 mL/min (ref >=60)
GLUCOSE: 106 mg/dL — AB (ref 70–99)
POTASSIUM: 4.2 mmol/L (ref 3.5–5.3)
SODIUM: 140 mmol/L (ref 135–146)
Total Bilirubin: 0.8 mg/dL (ref 0.2–1.2)
Total Protein: 7.1 g/dL (ref 6.1–8.1)

## 2015-08-25 LAB — CBC WITH DIFFERENTIAL/PLATELET
BASOS ABS: 99 {cells}/uL (ref 0–200)
Basophils Relative: 1 %
EOS PCT: 12 %
Eosinophils Absolute: 1188 cells/uL — ABNORMAL HIGH (ref 15–500)
HCT: 42.9 % (ref 35.0–45.0)
Hemoglobin: 14.1 g/dL (ref 12.0–15.0)
LYMPHS PCT: 28 %
Lymphs Abs: 2772 cells/uL (ref 850–3900)
MCH: 29 pg (ref 27.0–33.0)
MCHC: 32.9 g/dL (ref 32.0–36.0)
MCV: 88.3 fL (ref 80.0–100.0)
MONOS PCT: 6 %
MPV: 9.7 fL (ref 7.5–12.5)
Monocytes Absolute: 594 cells/uL (ref 200–950)
NEUTROS ABS: 5247 {cells}/uL (ref 1500–7800)
NEUTROS PCT: 53 %
PLATELETS: 260 10*3/uL (ref 140–400)
RBC: 4.86 MIL/uL (ref 3.80–5.10)
RDW: 13.2 % (ref 11.0–15.0)
WBC: 9.9 10*3/uL (ref 3.8–10.8)

## 2015-08-25 LAB — HEMOGLOBIN A1C
HEMOGLOBIN A1C: 5.9 % — AB (ref <5.7)
Mean Plasma Glucose: 123 mg/dL

## 2015-08-25 LAB — T4, FREE: Free T4: 1.1 ng/dL (ref 0.8–1.8)

## 2015-08-25 LAB — TSH: TSH: 4.66 mIU/L — ABNORMAL HIGH

## 2015-08-25 MED ORDER — OMEPRAZOLE 20 MG PO CPDR: 20.0000 mg | DELAYED_RELEASE_CAPSULE | Freq: Every day | ORAL | 3 refills | Status: DC

## 2015-08-25 NOTE — Progress Notes (Signed)
Patient ID: Amanda Pope MRN: [id number], DOB: Sep 07, 1945, 70 y.o. Date of Encounter: 08/25/2015,   Chief Complaint: Physical (CPE)  HPI: 70 y.o. y/o female  here for CPE.  She had hip surgery recently. She has a cane with her today. She says that she has only driven 4 or 5 times since the surgery.  Day she reports that she has been using over-the-counter Nexium occasionally for some heartburn. Is wanting a generic medication to use for this.  No other complaints or concerns today.  Subjective:   Patient presents for Medicare Annual/Subsequent preventive examination.   Review Past Medical/Family/Social: These are all documented below in each of these sections.   Risk Factors  Current exercise habits:  She has had recent hip surgery so she has been unable to do exercise other than what is recommended regarding physical therapy. Dietary issues discussed: At [facility] 07/2013-- I gave and reviewed low carbohydrate handout sheet at length. In addition to discussing specific foods to limit, possibly discuss specific foods she can and should eat.   At [place] 07/2014--- she reports that she is doing weight watchers. She has lost 30 pounds. Says that she has been doing this the past 5 months.   At f/u [facility] she had quit Weight Watchers and exercise and gained weight back. Weight today back up to 206.  Cardiac risk factors: Obesity, HTN, Past Tobacco Use  Depression Screen  (Note: if answer to either of the following is "Yes", a more complete depression screening is indicated)  Over the past two weeks, have you felt down, depressed or hopeless? No Over the past two weeks, have you felt little interest or pleasure in doing things? No Have you lost interest or pleasure in daily life? No Do you often feel hopeless? No Do you cry easily over simple problems? No   Activities of Daily Living  In your present state of health, do you have any difficulty performing the following activities?:  Driving? No    Managing money? No  Feeding yourself? No  Getting from bed to chair? No  Climbing a flight of stairs? No  Preparing food and eating?: No  Bathing or showering? No  Getting dressed: No  Getting to the toilet? No  Using the toilet:No  Moving around from place to place: No  In the past year have you fallen or had a near fall?:No  Are you sexually active? No  Do you have more than one partner? No   Hearing Difficulties: No  Do you often ask people to speak up or repeat themselves? No  Do you experience ringing or noises in your ears? No Do you have difficulty understanding soft or whispered voices? No  Do you feel that you have a problem with memory? No Do you often misplace items? No  Do you feel safe at home? Yes  Cognitive Testing  Alert? Yes Normal Appearance?Yes  Oriented to person? Yes Place? Yes  Time? Yes  Recall of three objects? Yes  Can perform simple calculations? Yes  Displays appropriate judgment?Yes  Can read the correct time from a watch face?Yes   List the Names of Other Physician/Practitioners you currently use: None   Indicate any recent Medical Services you may have received from other than [facility] providers in the past year (date may be approximate).   Screening Tests / Date---   Information regarding each of these is listed under the assessment and plan section at the end of the note. Colonoscopy  Zostavax  Mammogram  Influenza Vaccine  Tetanus/tdap    Assessment:    Annual wellness medicare exam   Plan:    During the course of the visit the patient was educated and counseled about appropriate screening and preventive services including:  Screening mammography  Colorectal cancer screening  Shingles vaccine. Prescription given to that she can get the vaccine at the pharmacy or Medicare part D.  Screen + for depression. PHQ- 9 score of 12 (moderate depression). We discussed the options of counseling versus possibly a medication.  I encouraged her strongly think about the counseling. She is going through some medical problems currently and her husband is as well Mrs. been very stressful for her. She says she will think about it. She does have Xanax to use as needed. Though she may benefit from an SSRI for her more depressive type symptoms but she wants to hold off at this time.  I aksed her to please have her cardioloist send records since we have none on file.  Diet review for nutrition referral? Yes ____ Not Indicated __x__  Patient Instructions (the written plan) was given to the patient.  Medicare Attestation  I have personally reviewed:  The patient's medical and social history  Their use of alcohol, tobacco or illicit drugs  Their current medications and supplements  The patient's functional ability including ADLs,fall risks, home safety risks, cognitive, and hearing and visual impairment  Diet and physical activities  Evidence for depression or mood disorders  The patient's weight, height, BMI, and visual acuity have been recorded in the chart. I have made referrals, counseling, and provided education to the patient based on review of the above and I have provided the patient with a written personalized care plan for preventive services.        Review of Systems: Consitutional: No fever, chills, fatigue, night sweats, lymphadenopathy. No significant/unexplained weight changes. Eyes: No visual changes, eye redness, or discharge. ENT/Mouth: No ear pain, sore throat, nasal drainage, or sinus pain. Cardiovascular: No chest pressure,heaviness, tightness or squeezing, even with exertion. No increased shortness of breath or dyspnea on exertion.No palpitations, edema, orthopnea, PND. Respiratory: No cough, hemoptysis, SOB, or wheezing. Gastrointestinal: No anorexia, dysphagia, reflux, pain, nausea, vomiting, hematemesis, diarrhea, constipation, BRBPR, or melena. Breast: No mass, nodules, bulging, or retraction. No  skin changes or inflammation. No nipple discharge. No lymphadenopathy. Genitourinary: No dysuria, hematuria, incontinence, vaginal discharge, pruritis, burning, abnormal bleeding, or pain. Musculoskeletal: No decreased ROM, No joint pain or swelling. No significant pain in neck, back, or extremities. Skin: No rash, pruritis, or concerning lesions. Neurological: No headache, dizziness, syncope, seizures, tremors, memory loss, coordination problems, or paresthesias. Psychological: No anxiety, depression, hallucinations, SI/HI. Endocrine: No polydipsia, polyphagia, polyuria, or known diabetes.No increased fatigue. No palpitations/rapid heart rate. No significant/unexplained weight change. All other systems were reviewed and are otherwise negative.  Past Medical History:  Diagnosis Date  . Arthritis    hip & back  . Asthma   . COPD (chronic obstructive pulmonary disease) ([place])   . Cystocele   . Depression   . GERD (gastroesophageal reflux disease)   . History of Helicobacter pylori infection   . Hypertension   . Shortness of breath dyspnea   . Urticaria   . Vitamin D deficiency      Past Surgical History:  Procedure Laterality Date  . ABDOMINAL HYSTERECTOMY     And Removed 1 ovary  . APPENDECTOMY    . CHOLECYSTECTOMY  05/2007  . TONSILLECTOMY    .  TOTAL HIP ARTHROPLASTY Right 06/09/2015  . TOTAL HIP ARTHROPLASTY Right 06/09/2015   Procedure: RIGHT TOTAL HIP ARTHROPLASTY ANTERIOR APPROACH;  Surgeon: [name], MD;  Location: [facility];  Service: Orthopedics;  Laterality: Right;  . TUBAL LIGATION      Home Meds:  Outpatient Medications Prior to Visit  Medication Sig Dispense Refill  . albuterol (PROVENTIL HFA;VENTOLIN HFA) 108 (90 BASE) MCG/ACT inhaler Inhale 2 puffs into the lungs every 6 (six) hours as needed for wheezing or shortness of breath. 1 Inhaler 0  . aspirin EC 325 MG tablet Take 1 tablet (325 mg total) by mouth 2 (two) times daily. 84 tablet 0  . budesonide-formoterol  (SYMBICORT) 160-4.5 MCG/ACT inhaler Inhale 2 puffs into the lungs 2 (two) times daily. 1 Inhaler 3  . Cholecalciferol (VITAMIN D) 2000 units CAPS Take 1 capsule (2,000 Units total) by mouth daily. 90 capsule 3  . Esomeprazole Magnesium (NEXIUM PO) Take 1 capsule by mouth every other day. OTC    . estradiol (ESTRACE) 0.5 MG tablet TAKE 1 TABLET BY MOUTH EVERY DAY 90 tablet 1  . hydrochlorothiazide (HYDRODIURIL) 25 MG tablet Take 1 tablet (25 mg total) by mouth daily. 90 tablet 3  . levocetirizine (XYZAL) 5 MG tablet TAKE 1 TABLET (5 MG TOTAL) BY MOUTH EVERY EVENING. (Patient taking differently: TAKE 1 TABLET (5 MG TOTAL) BY MOUTH EVERY MORNING) 30 tablet 11  . calcium gluconate 500 MG tablet Take 1 tablet (500 mg total) by mouth 3 (three) times daily. (Patient not taking: Reported on 06/01/2015) 30 tablet 11  . fluticasone (FLONASE) 50 MCG/ACT nasal spray Place 2 sprays into both nostrils daily. (Patient not taking: Reported on 06/01/2015) 16 g 5  . ibuprofen (ADVIL,MOTRIN) 200 MG tablet Take 800 mg by mouth every 8 (eight) hours as needed.    . methocarbamol (ROBAXIN) 750 MG tablet Take 1 tablet (750 mg total) by mouth 2 (two) times daily as needed for muscle spasms. (Patient not taking: Reported on 08/25/2015) 60 tablet 0  . omeprazole (PRILOSEC) 20 MG capsule Take 1 capsule (20 mg total) by mouth 2 (two) times daily before a meal. (Patient not taking: Reported on 12/09/2014) 28 capsule 0  . ondansetron (ZOFRAN) 4 MG tablet Take 1-2 tablets (4-8 mg total) by mouth every 8 (eight) hours as needed for nausea or vomiting. (Patient not taking: Reported on 08/25/2015) 40 tablet 0  . oxyCODONE (OXY IR/ROXICODONE) 5 MG immediate release tablet Take 1-3 tablets (5-15 mg total) by mouth every 4 (four) hours as needed. (Patient not taking: Reported on 08/25/2015) 90 tablet 0  . oxyCODONE (OXYCONTIN) 10 mg 12 hr tablet Take 1 tablet (10 mg total) by mouth every 12 (twelve) hours. (Patient not taking: Reported on  08/25/2015) 10 tablet 0  . senna-docusate (SENOKOT S) 8.6-50 MG tablet Take 1 tablet by mouth at bedtime as needed. (Patient not taking: Reported on 08/25/2015) 30 tablet 1  . traMADol (ULTRAM) 50 MG tablet 1-2 tabs Q 8 hrs as needed for pain (Patient not taking: Reported on 06/01/2015) 30 tablet 0   No facility-administered medications prior to visit.     Allergies:  Allergies  Allergen Reactions  . Prevalite [Cholestyramine Light] Hives  . Ace Inhibitors Rash    Social History   Social History  . Marital status: Married    Spouse name: N/A  . Number of children: N/A  . Years of education: N/A   Occupational History  . Not on file.   Social History Main  Topics  . Smoking status: Former Smoker    Packs/day: 1.00    Types: Cigarettes    Start date: 01/29/1965    Quit date: 10/30/2010  . Smokeless tobacco: Never Used  . Alcohol use No  . Drug use: No  . Sexual activity: Not on file   Other Topics Concern  . Not on file   Social History Narrative   Entered 07/2013:   Is retired. Keeps [facility]. Loves it.   Married.    Family History  Problem Relation Age of Onset  . Cancer Mother     colon  . Cancer Sister     ovarian  . Cancer Brother     melanoma  . Cancer Brother 72    colon cancer    Physical Exam: Blood pressure (!) 144/94, pulse 76, temperature 97.9 F (36.6 C), temperature source Oral, height 5\' 5"  (1.651 m), weight 206 lb (93.4 kg)., Body mass index is 34.28 kg/m. General: [name]. Appears in no acute distress. HEENT: Normocephalic, atraumatic. Conjunctiva pink, sclera non-icteric. Pupils 2 mm constricting to 1 mm, round, regular, and equally reactive to light and accomodation. EOMI. Internal auditory canal clear. TMs with good cone of light and without pathology. Nasal mucosa pink. Nares are without discharge. No sinus tenderness. Oral mucosa pink.  Pharynx without exudate.   Neck: Supple. Trachea midline. No thyromegaly. Full ROM. No  lymphadenopathy.No Carotid Bruits. Lungs: Mild wheezes throughout bilaterally.Harsh breath sounds/wheezes at bases bilaterally.  Cardiovascular: RRR with S1 S2. No murmurs, rubs, or gallops. Distal pulses 2+ symmetrically. No carotid or abdominal bruits. Breast: She had recent hip surgery. Says that she cannot get on exam table for those areas of exam. Says that she does self breast exam and has felt no new masses. Abdomen:She had recent hip surgery. Says that she cannot get on exam table  Genitourinary:  She had recent hip surgery. Says that she cannot get on exam table  Musculoskeletal: Full range of motion and 5/5 strength throughout---except for right hip--had recent surgery. Skin: Warm and moist without erythema, ecchymosis, wounds, or rash. Neuro: A+Ox3. CN II-XII grossly intact. Moves all extremities spontaneously. Full sensation throughout.  Psych:  Responds to questions appropriately with a normal affect.   Assessment/Plan:  70 y.o. y/o female here for CPE  1. Visit for preventive health examination  A. Screening Labs: Check now    B. Pap: She has history of hysterectomy and this was not performed secondary to cancer. Therefore she does not need further Pap smear. Pelvic exam normal 07/2014.Pelvic Exam deferred at [facility] [facility] 07/2015 secondary to recent hip surgery  C. Screening Mammogram: Mammogram 09/25/2013 negative. At [facility] 07/2014---Reminded her that this will be due soon and needs to be done after August 28 08/25/2015: She never went for mammogram last year. Has not had one since 2015. Did her this today and I have placed order for mammogram and DEXA at today's visit. Breast center.  D. DEXA/BMD:  - DG Bone Density; Future At [facility] 07/2013--She stated that she had never had a bone density test. She was agreeable to have one performed so I ordered this.  However at follow-up 07/2014 I reviewed that it does not look like she ever had this. She states that the day she went for this she had  diarrhea so they did not perform this. Never rescheduled it. 08/23/2014--Today I have placed another order for this to be scheduled again. 08/25/2015--- she never went for DEXA scan. At visit today I  have placed order for mammogram and DEXA at breast Center. Discussed this at length with her and encouraged her to please follow-up for these.  E. Colorectal Cancer Screening: At [facility] 07/2013---She stated that she recently received a letter that she is due to schedule followup colonoscopy. States that prior one was performed by Dr. Collene Mares. She will call to schedule followup. At [facility] 07/2014--- she says that she never did call to schedule the colonoscopy. Reminded her to do so today. At [facility] 07/2015--- he says that she never did call and schedule this and never went for follow-up. Told her to go home and call Dr. Lorie Apley office and schedule follow-up there.  F. Immunizations:  Influenza: N/A---July Tetanus: ----Given 08/27/2013 Pneumococcal: Prevnar 13--Given here 07/2013.                     --- PNEUMOVAX 23----Given here 08/25/2015 Zostavax: Will discuss at next office visit.  1. Medicare annual wellness visit, subsequent - CBC with Differential/Platelet - COMPLETE METABOLIC PANEL WITH GFR - Lipid panel - Hemoglobin A1c - TSH - VITAMIN D 25 Hydroxy (Vit-D Deficiency, Fractures) - T4, free - MM Digital Screening; Future - DG Bone Density; Future  Gastroesophageal reflux disease, esophagitis presence not specified See HPI 08/25/2015 OV - omeprazole (PRILOSEC) 20 MG capsule; Take 1 capsule (20 mg total) by mouth daily.  Dispense: 30 capsule; Refill: 3  2. Hyperglycemia At [facility] 07/2013---- I discussed with her her elevated glucose level and her A1c level. -----------------------I gave and reviewed carbohydrate handout. Discussed this in detail and discussed examples of foods to limit and foods that she can eat. At [place] 07/2014 she has lost >30 pounds over past 1 year-----glucose now normal.  At [place] 07/2015 she  has gained weight back. We'll recheck A1c to monitor.  3. Subclinical hypothyroidism TSH slightly elevated.  Free T4 added to lab.   4. Essential hypertension Blood pressure is well controlled. Continue current medication. - amLODipine (NORVASC) 5 MG tablet; Take 1 tablet (5 mg total) by mouth daily.  Dispense: 90 tablet; Refill: 1  5. Hormone replacement therapy (postmenopausal) She states that she has tried coming off of hormones multiple times but every time developed severe hot flashes and cannot stay off of them. She had been told that it would take her body a while to get used to it so one time she did state off of the hormones for a full 6 months but then she was having severe hot flashes even after being off of therapy for 6 months and had to restart. She is well aware of potential adverse effects of HRT. - estradiol (ESTRACE) 0.5 MG tablet; TAKE 1 TABLET BY MOUTH EVERY DAY  Dispense: 30 tablet; Refill: 5  6. Pulmonary emphysema, unspecified emphysema type - albuterol (PROVENTIL HFA;VENTOLIN HFA) 108 (90 BASE) MCG/ACT inhaler; Inhale 2 puffs into the lungs every 6 (six) hours as needed for wheezing or shortness of breath.  Dispense: 1 Inhaler; Refill: 0 - Fluticasone-Salmeterol (ADVAIR DISKUS) 250-50 MCG/DOSE AEPB; Inhale 1 puff into the lungs 2 (two) times daily.  Dispense: 1 each; Refill: 11  7. Diarrhea The following is copied from 07/2013 OV Note:    "She states that ever since she had her gallbladder removed she has diarrhea about 45 minutes after eating a meal.  She says that she had discussed this with Dr. Dennard Schaumann in the past and he had told her to use Imodium prior to meals. She has been using Imodium but still 4  years later is still having the same problem. Says that in the past Dr. Dennard Schaumann told her that if the Imodium did not work then we could prescribe a different medicine. He is requesting that medication. - cholestyramine light (PREVALITE) 4 G packet; Take 1 packet (4 g  total) by mouth 2 (two) times daily.  Dispense: 60 packet; Refill: 5"  ROV 6 months. Sooner if needed.  [address] [place], [place], [facility] 08/25/2015 12:01 PM

## 2015-08-25 NOTE — Addendum Note (Signed)
Addended by: Olena Mater on: 08/25/2015 12:25 PM   Modules accepted: Orders

## 2015-08-26 LAB — VITAMIN D 25 HYDROXY (VIT D DEFICIENCY, FRACTURES): Vit D, 25-Hydroxy: 27 ng/mL — ABNORMAL LOW (ref 30–100)

## 2015-08-31 ENCOUNTER — Other Ambulatory Visit: Payer: Self-pay | Admitting: Family Medicine

## 2015-08-31 DIAGNOSIS — E038 Other specified hypothyroidism: Secondary | ICD-10-CM

## 2015-08-31 DIAGNOSIS — Z79890 Hormone replacement therapy (postmenopausal): Secondary | ICD-10-CM

## 2015-08-31 DIAGNOSIS — E039 Hypothyroidism, unspecified: Secondary | ICD-10-CM

## 2015-08-31 DIAGNOSIS — Z79899 Other long term (current) drug therapy: Secondary | ICD-10-CM

## 2015-08-31 DIAGNOSIS — I10 Essential (primary) hypertension: Secondary | ICD-10-CM

## 2015-08-31 DIAGNOSIS — E559 Vitamin D deficiency, unspecified: Secondary | ICD-10-CM

## 2015-08-31 MED ORDER — VITAMIN D (ERGOCALCIFEROL) 1.25 MG (50000 UNIT) PO CAPS: 50000.0000 [IU] | ORAL_CAPSULE | ORAL | 1 refills | Status: DC

## 2015-09-13 ENCOUNTER — Other Ambulatory Visit: Payer: Self-pay | Admitting: Allergy and Immunology

## 2015-09-13 DIAGNOSIS — J449 Chronic obstructive pulmonary disease, unspecified: Secondary | ICD-10-CM

## 2015-09-14 ENCOUNTER — Other Ambulatory Visit: Payer: Self-pay | Admitting: Physician Assistant

## 2015-09-14 NOTE — Telephone Encounter (Signed)
Medication refilled per protocol. 

## 2015-09-28 ENCOUNTER — Other Ambulatory Visit: Payer: Self-pay | Admitting: Physician Assistant

## 2015-09-28 DIAGNOSIS — Z1231 Encounter for screening mammogram for malignant neoplasm of breast: Secondary | ICD-10-CM

## 2015-09-28 DIAGNOSIS — E2839 Other primary ovarian failure: Secondary | ICD-10-CM

## 2015-09-28 DIAGNOSIS — Z780 Asymptomatic menopausal state: Secondary | ICD-10-CM

## 2015-10-27 ENCOUNTER — Other Ambulatory Visit: Payer: Self-pay

## 2015-10-27 ENCOUNTER — Other Ambulatory Visit: Payer: Self-pay | Admitting: Allergy and Immunology

## 2015-10-27 DIAGNOSIS — J449 Chronic obstructive pulmonary disease, unspecified: Secondary | ICD-10-CM

## 2015-10-27 MED ORDER — BUDESONIDE-FORMOTEROL FUMARATE 160-4.5 MCG/ACT IN AERO: 2.0000 | INHALATION_SPRAY | Freq: Two times a day (BID) | RESPIRATORY_TRACT | 0 refills | Status: DC

## 2015-10-27 NOTE — Telephone Encounter (Signed)
RX refilled per protocol 

## 2015-11-03 ENCOUNTER — Other Ambulatory Visit: Payer: Self-pay | Admitting: Family Medicine

## 2015-11-03 DIAGNOSIS — I10 Essential (primary) hypertension: Secondary | ICD-10-CM

## 2015-11-15 ENCOUNTER — Ambulatory Visit
Admission: RE | Admit: 2015-11-15 | Discharge: 2015-11-15 | Disposition: A | Discharge status: Home / Self Care | Payer: PPO | Source: Ambulatory Visit | Attending: Physician Assistant | Admitting: Physician Assistant

## 2015-11-15 DIAGNOSIS — Z780 Asymptomatic menopausal state: Secondary | ICD-10-CM | POA: Diagnosis not present

## 2015-11-15 DIAGNOSIS — Z1231 Encounter for screening mammogram for malignant neoplasm of breast: Secondary | ICD-10-CM | POA: Diagnosis not present

## 2015-11-15 DIAGNOSIS — Z13820 Encounter for screening for osteoporosis: Secondary | ICD-10-CM | POA: Diagnosis not present

## 2015-11-15 DIAGNOSIS — E2839 Other primary ovarian failure: Secondary | ICD-10-CM

## 2015-12-24 ENCOUNTER — Other Ambulatory Visit: Payer: Self-pay | Admitting: Physician Assistant

## 2015-12-24 DIAGNOSIS — K219 Gastro-esophageal reflux disease without esophagitis: Secondary | ICD-10-CM

## 2015-12-26 NOTE — Telephone Encounter (Signed)
Rx filled per protocol  

## 2016-01-27 ENCOUNTER — Other Ambulatory Visit: Payer: Self-pay | Admitting: Allergy and Immunology

## 2016-01-27 DIAGNOSIS — J449 Chronic obstructive pulmonary disease, unspecified: Secondary | ICD-10-CM

## 2016-02-16 ENCOUNTER — Other Ambulatory Visit: Payer: Self-pay | Admitting: Family Medicine

## 2016-02-16 DIAGNOSIS — E559 Vitamin D deficiency, unspecified: Secondary | ICD-10-CM

## 2016-02-17 NOTE — Telephone Encounter (Signed)
High dose Vit D was only for 6 months.  Refill denied.  Pt has up coming appt.

## 2016-02-23 ENCOUNTER — Other Ambulatory Visit: Payer: Self-pay | Admitting: *Deleted

## 2016-02-23 MED ORDER — LEVOCETIRIZINE DIHYDROCHLORIDE 5 MG PO TABS: ORAL_TABLET | ORAL | 1 refills | Status: DC

## 2016-02-23 NOTE — Telephone Encounter (Signed)
Received fax requesting refill on xyzal.   Refill appropriate and filled per protocol.

## 2016-02-24 ENCOUNTER — Other Ambulatory Visit: Payer: Self-pay

## 2016-02-24 DIAGNOSIS — K219 Gastro-esophageal reflux disease without esophagitis: Secondary | ICD-10-CM

## 2016-02-24 DIAGNOSIS — J449 Chronic obstructive pulmonary disease, unspecified: Secondary | ICD-10-CM

## 2016-02-24 MED ORDER — OMEPRAZOLE 20 MG PO CPDR: 20.0000 mg | DELAYED_RELEASE_CAPSULE | Freq: Every day | ORAL | 0 refills | Status: DC

## 2016-02-24 NOTE — Telephone Encounter (Signed)
Rx filled per protocol  

## 2016-03-01 ENCOUNTER — Other Ambulatory Visit: Payer: PPO

## 2016-03-01 DIAGNOSIS — E559 Vitamin D deficiency, unspecified: Secondary | ICD-10-CM | POA: Diagnosis not present

## 2016-03-01 DIAGNOSIS — Z79890 Hormone replacement therapy (postmenopausal): Secondary | ICD-10-CM

## 2016-03-01 DIAGNOSIS — Z79899 Other long term (current) drug therapy: Secondary | ICD-10-CM

## 2016-03-01 DIAGNOSIS — E038 Other specified hypothyroidism: Secondary | ICD-10-CM

## 2016-03-01 DIAGNOSIS — I10 Essential (primary) hypertension: Secondary | ICD-10-CM | POA: Diagnosis not present

## 2016-03-01 DIAGNOSIS — E039 Hypothyroidism, unspecified: Secondary | ICD-10-CM | POA: Diagnosis not present

## 2016-03-01 LAB — TSH: TSH: 4.1 mIU/L

## 2016-03-01 LAB — COMPLETE METABOLIC PANEL WITH GFR
ALBUMIN: 4 g/dL (ref 3.6–5.1)
ALT: 19 U/L (ref 6–29)
AST: 14 U/L (ref 10–35)
Alkaline Phosphatase: 97 U/L (ref 33–130)
BILIRUBIN TOTAL: 0.7 mg/dL (ref 0.2–1.2)
BUN: 13 mg/dL (ref 7–25)
CO2: 28 mmol/L (ref 20–31)
CREATININE: 0.74 mg/dL (ref 0.50–0.99)
Calcium: 9 mg/dL (ref 8.6–10.4)
Chloride: 101 mmol/L (ref 98–110)
GFR, Est African American: >89 mL/min (ref >=60)
GFR, Est Non African American: 83 mL/min (ref >=60)
Glucose, Bld: 106 mg/dL — ABNORMAL HIGH (ref 70–99)
Potassium: 4 mmol/L (ref 3.5–5.3)
Sodium: 141 mmol/L (ref 135–146)
TOTAL PROTEIN: 6.9 g/dL (ref 6.1–8.1)

## 2016-03-01 LAB — CBC WITH DIFFERENTIAL/PLATELET
BASOS PCT: 1 %
Basophils Absolute: 103 cells/uL (ref 0–200)
EOS ABS: 1442 {cells}/uL — AB (ref 15–500)
EOS PCT: 14 %
HCT: 42 % (ref 35.0–45.0)
Hemoglobin: 14 g/dL (ref 12.0–15.0)
Lymphocytes Relative: 29 %
Lymphs Abs: 2987 cells/uL (ref 850–3900)
MCH: 29.2 pg (ref 27.0–33.0)
MCHC: 33.3 g/dL (ref 32.0–36.0)
MCV: 87.5 fL (ref 80.0–100.0)
MONOS PCT: 5 %
MPV: 9.3 fL (ref 7.5–12.5)
Monocytes Absolute: 515 cells/uL (ref 200–950)
NEUTROS ABS: 5253 {cells}/uL (ref 1500–7800)
Neutrophils Relative %: 51 %
PLATELETS: 257 10*3/uL (ref 140–400)
RBC: 4.8 MIL/uL (ref 3.80–5.10)
RDW: 13.8 % (ref 11.0–15.0)
WBC: 10.3 10*3/uL (ref 3.8–10.8)

## 2016-03-01 LAB — LIPID PANEL
CHOLESTEROL: 177 mg/dL (ref <200)
HDL: 46 mg/dL — ABNORMAL LOW (ref >50)
LDL CALC: 94 mg/dL (ref <100)
Total CHOL/HDL Ratio: 3.8 Ratio (ref <5.0)
Triglycerides: 184 mg/dL — ABNORMAL HIGH (ref <150)
VLDL: 37 mg/dL — AB (ref <30)

## 2016-03-01 LAB — HEMOGLOBIN A1C
HEMOGLOBIN A1C: 6 % — AB (ref <5.7)
MEAN PLASMA GLUCOSE: 126 mg/dL

## 2016-03-02 LAB — VITAMIN D 25 HYDROXY (VIT D DEFICIENCY, FRACTURES): VIT D 25 HYDROXY: 33 ng/mL (ref 30–100)

## 2016-03-05 ENCOUNTER — Ambulatory Visit: Payer: Self-pay | Admitting: Family Medicine

## 2016-03-21 ENCOUNTER — Other Ambulatory Visit: Payer: Self-pay | Admitting: Family Medicine

## 2016-03-21 MED ORDER — LEVOCETIRIZINE DIHYDROCHLORIDE 5 MG PO TABS: ORAL_TABLET | ORAL | 3 refills | Status: DC

## 2016-03-27 ENCOUNTER — Other Ambulatory Visit: Payer: Self-pay

## 2016-03-27 DIAGNOSIS — K219 Gastro-esophageal reflux disease without esophagitis: Secondary | ICD-10-CM

## 2016-05-07 ENCOUNTER — Other Ambulatory Visit: Payer: Self-pay | Admitting: Allergy and Immunology

## 2016-05-07 DIAGNOSIS — J449 Chronic obstructive pulmonary disease, unspecified: Secondary | ICD-10-CM

## 2016-05-07 NOTE — Telephone Encounter (Signed)
Denied Symbicort. Patient was last seen 11/30/2014. Patient needs an office visit for further refills.

## 2016-05-09 ENCOUNTER — Encounter: Payer: Self-pay | Admitting: Physician Assistant

## 2016-05-09 ENCOUNTER — Ambulatory Visit (INDEPENDENT_AMBULATORY_CARE_PROVIDER_SITE_OTHER): Payer: PPO | Admitting: Physician Assistant

## 2016-05-09 VITALS — BP 132/88 | HR 73 | Temp 97.8°F | Resp 16 | Wt 219.0 lb

## 2016-05-09 DIAGNOSIS — E559 Vitamin D deficiency, unspecified: Secondary | ICD-10-CM | POA: Diagnosis not present

## 2016-05-09 DIAGNOSIS — R739 Hyperglycemia, unspecified: Secondary | ICD-10-CM | POA: Diagnosis not present

## 2016-05-09 DIAGNOSIS — I10 Essential (primary) hypertension: Secondary | ICD-10-CM | POA: Diagnosis not present

## 2016-05-09 DIAGNOSIS — J449 Chronic obstructive pulmonary disease, unspecified: Secondary | ICD-10-CM | POA: Diagnosis not present

## 2016-05-09 DIAGNOSIS — E039 Hypothyroidism, unspecified: Secondary | ICD-10-CM

## 2016-05-09 DIAGNOSIS — Z79890 Hormone replacement therapy (postmenopausal): Secondary | ICD-10-CM | POA: Diagnosis not present

## 2016-05-09 DIAGNOSIS — J439 Emphysema, unspecified: Secondary | ICD-10-CM

## 2016-05-09 DIAGNOSIS — E038 Other specified hypothyroidism: Secondary | ICD-10-CM

## 2016-05-09 MED ORDER — VITAMIN D3 50 MCG (2000 UT) PO CAPS: ORAL_CAPSULE | ORAL | 11 refills | Status: DC

## 2016-05-09 NOTE — Progress Notes (Signed)
Patient ID: Amanda Pope MRN: [id number], DOB: February 23, 1945, 71 y.o. Date of Encounter: @DATE @  Chief Complaint:  Chief Complaint  Patient presents with  . need refill on symbicort    HPI: 71 y.o. year old white femalehite female  presents for routine f/u and labs.   Says that when she recently called to get refill on her Symbicort she was told she needed office visit. I reviewed her chart today I saw that she had labs done 03/01/16. She says that she had an office visit scheduled but had to postpone it because of weather. Had snow at that time. Then had a death in the family and forgot and never had follow-up office visit. She is taking blood pressure medicine as directed. No adverse effects. No lightheadedness. She is not taking any vitamin D supplements right now. Says that she took the pill weekly prescription dosed in the past but has not been on any vitamin D since then. She continues to take her hormone replacement therapy. Controlling her hot flashes and is causing no adverse effects. Had prior office visit with me where we had a complete discussion regarding risk associated with HRT and she was willing to accept those risk is the only thing that would control her hot flashes and symptoms were these hormones.   She has no specific concerns to address today.   Past Medical History:  Diagnosis Date  . Arthritis    hip & back  . Asthma   . COPD (chronic obstructive pulmonary disease) ([place])   . Cystocele   . Depression   . GERD (gastroesophageal reflux disease)   . History of Helicobacter pylori infection   . Hypertension   . Shortness of breath dyspnea   . Urticaria   . Vitamin D deficiency      Home Meds: Outpatient Medications Prior to Visit  Medication Sig Dispense Refill  . albuterol (PROVENTIL HFA;VENTOLIN HFA) 108 (90 BASE) MCG/ACT inhaler Inhale 2 puffs into the lungs every 6 (six) hours as needed for wheezing or shortness of breath. 1 Inhaler 0  . aspirin EC 325 MG tablet  Take 1 tablet (325 mg total) by mouth 2 (two) times daily. 84 tablet 0  . budesonide-formoterol (SYMBICORT) 160-4.5 MCG/ACT inhaler Inhale 2 puffs into the lungs 2 (two) times daily. 10.2 Inhaler 0  . calcium gluconate 500 MG tablet Take 1 tablet (500 mg total) by mouth 3 (three) times daily. 30 tablet 11  . Esomeprazole Magnesium (NEXIUM PO) Take 1 capsule by mouth every other day. OTC    . estradiol (ESTRACE) 0.5 MG tablet TAKE 1 TABLET BY MOUTH EVERY DAY 90 tablet 1  . hydrochlorothiazide (HYDRODIURIL) 25 MG tablet TAKE 1 TABLET EVERY DAY 90 tablet 3  . levocetirizine (XYZAL) 5 MG tablet TAKE 1 TABLET (5 MG TOTAL) BY MOUTH EVERY EVENING. 90 tablet 3  . omeprazole (PRILOSEC) 20 MG capsule Take 1 capsule (20 mg total) by mouth daily. 90 capsule 0  . budesonide-formoterol (SYMBICORT) 160-4.5 MCG/ACT inhaler INHALE 2 PUFFS INTO THE LUNGS 2 (TWO) TIMES DAILY. 10.2 Inhaler 0  . Cholecalciferol (VITAMIN D) 2000 units CAPS Take 1 capsule (2,000 Units total) by mouth daily. 90 capsule 3  . fluticasone (FLONASE) 50 MCG/ACT nasal spray Place 2 sprays into both nostrils daily. (Patient not taking: Reported on 06/01/2015) 16 g 5  . Vitamin D, Ergocalciferol, (DRISDOL) 50000 units CAPS capsule Take 1 capsule (50,000 Units total) by mouth every 7 (seven) days. For 6 months 12 capsule  1   No facility-administered medications prior to visit.     Allergies:  Allergies  Allergen Reactions  . Prevalite [Cholestyramine Light] Hives  . Ace Inhibitors Rash    Social History   Social History  . Marital status: Married    Spouse name: N/A  . Number of children: N/A  . Years of education: N/A   Occupational History  . Not on file.   Social History Main Topics  . Smoking status: Former Smoker    Packs/day: 1.00    Types: Cigarettes    Start date: 01/29/1965    Quit date: 10/30/2010  . Smokeless tobacco: Never Used  . Alcohol use No  . Drug use: No  . Sexual activity: Not on file   Other Topics  Concern  . 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   Married.    Family History  Problem Relation Age of Onset  . Cancer Mother     colon  . Cancer Sister     ovarian  . Cancer Brother     melanoma  . Cancer Brother 83    colon cancer     Review of Systems:  See HPI for pertinent ROS. All other ROS negative.    Physical Exam: Blood pressure 132/88, pulse 73, temperature 97.8 F (36.6 C), temperature source Oral, resp. rate 16, weight 219 lb (99.3 kg), SpO2 97 %., Body mass index is 36.44 kg/m. General: Overweight white female . Appears in no acute distress. Neck: Supple. No thyromegaly. No lymphadenopathy. No carotid bruits. Lungs: Clear bilaterally to auscultation without wheezes, rales, or rhonchi. Breathing is unlabored. Heart: RRR with S1 S2. No murmurs, rubs, or gallops. Abdomen: Soft, non-tender, non-distended with normoactive bowel sounds. No hepatomegaly. No rebound/guarding. No obvious abdominal masses. Musculoskeletal:  Strength and tone normal for age. Extremities/Skin: Warm and dry.  No edema.  Neuro: Alert and oriented X 3. Moves all extremities spontaneously. Gait is normal. CNII-XII grossly in tact. Psych:  Responds to questions appropriately with a normal affect.     ASSESSMENT AND PLAN:  71 y.o. year old female with  1. Essential hypertension Blood Pressure reading is at goal. BMET normal 03/2016. Cont current med  2. Vitamin D deficiency Vitamin D level was checked 03/2016--- Vit D level has drifted down to low end of normal---start otc Vit D 4,000 units daily  3. Pulmonary emphysema, unspecified emphysema type ([place]) She is on Symbicort. This is currently stable/controlled.  4. Subclinical hypothyroidism TSH normal 03/2016  5. Hyperglycemia A1C 03/2016 was good. Cont diet, exercise to keep this controlled.   6. Hormone replacement therapy (postmenopausal) She states that she has  tried coming off of hormones multiple times but every time developed severe hot flashes and cannot stay off of them. She had been told that it would take her body a while to get used to it so one time she did state off of the hormones for a full 6 months but then she was having severe hot flashes even after being off of therapy for 6 months and had to restart. She is well aware of potential adverse effects of HRT. - estradiol (ESTRACE) 0.5 MG tablet; TAKE 1 TABLET BY MOUTH EVERY DAY  Dispense: 30 tablet; Refill: 5   Plan for routine follow-up in 6 months or sooner if needed.    [address] [place], [place], [facility] 05/09/2016 12:52 PM

## 2016-05-28 ENCOUNTER — Other Ambulatory Visit: Payer: Self-pay | Admitting: Family Medicine

## 2016-06-04 ENCOUNTER — Encounter: Payer: Self-pay | Admitting: Physician Assistant

## 2016-06-04 ENCOUNTER — Ambulatory Visit (INDEPENDENT_AMBULATORY_CARE_PROVIDER_SITE_OTHER): Payer: PPO | Admitting: Physician Assistant

## 2016-06-04 VITALS — BP 128/88 | HR 79 | Temp 98.0°F | Resp 16 | Wt 216.0 lb

## 2016-06-04 DIAGNOSIS — R300 Dysuria: Secondary | ICD-10-CM

## 2016-06-04 DIAGNOSIS — N3001 Acute cystitis with hematuria: Secondary | ICD-10-CM

## 2016-06-04 LAB — URINALYSIS, ROUTINE W REFLEX MICROSCOPIC
BILIRUBIN URINE: NEGATIVE
Glucose, UA: NEGATIVE
KETONES UR: NEGATIVE
NITRITE: POSITIVE — AB
PH: 5.5 (ref 5.0–8.0)
SPECIFIC GRAVITY, URINE: 1.03 (ref 1.001–1.035)

## 2016-06-04 LAB — URINALYSIS, MICROSCOPIC ONLY
CASTS: NONE SEEN [LPF]
CRYSTALS: NONE SEEN [HPF]
Yeast: NONE SEEN [HPF]

## 2016-06-04 MED ORDER — CIPROFLOXACIN HCL 500 MG PO TABS: 500.0000 mg | ORAL_TABLET | Freq: Two times a day (BID) | ORAL | 0 refills | Status: DC

## 2016-06-04 NOTE — Progress Notes (Signed)
Patient ID: Amanda Pope MRN: [id number], DOB: November 07, 1945, 71 y.o. Date of Encounter: 06/04/2016, 3:53 PM    Chief Complaint:  Chief Complaint  Patient presents with  . Dysuria    started today   .   Marland Kitchen odor with urine     HPI: 71 y.o. year old female says she just started noticing symptoms today but all of a sudden symptoms got bad and were worse the last time she urinated. Says that she was having a lot of burning when she urinated--says she "knew she wouldn't be able to stand it if she couldn't see someone today". Has had no fevers or chills. No back pain.     Home Meds:   Outpatient Medications Prior to Visit  Medication Sig Dispense Refill  . albuterol (PROVENTIL HFA;VENTOLIN HFA) 108 (90 BASE) MCG/ACT inhaler Inhale 2 puffs into the lungs every 6 (six) hours as needed for wheezing or shortness of breath. 1 Inhaler 0  . aspirin EC 325 MG tablet Take 1 tablet (325 mg total) by mouth 2 (two) times daily. 84 tablet 0  . budesonide-formoterol (SYMBICORT) 160-4.5 MCG/ACT inhaler Inhale 2 puffs into the lungs 2 (two) times daily. 10.2 Inhaler 0  . calcium gluconate 500 MG tablet Take 1 tablet (500 mg total) by mouth 3 (three) times daily. 30 tablet 11  . Cholecalciferol (VITAMIN D3) 2000 units capsule Take 2 daily 60 capsule 11  . Esomeprazole Magnesium (NEXIUM PO) Take 1 capsule by mouth every other day. OTC    . estradiol (ESTRACE) 0.5 MG tablet TAKE 1 TABLET BY MOUTH EVERY DAY 90 tablet 1  . hydrochlorothiazide (HYDRODIURIL) 25 MG tablet TAKE 1 TABLET EVERY DAY 90 tablet 3  . levocetirizine (XYZAL) 5 MG tablet TAKE 1 TABLET (5 MG TOTAL) BY MOUTH EVERY EVENING. 90 tablet 3  . omeprazole (PRILOSEC) 20 MG capsule Take 1 capsule (20 mg total) by mouth daily. 90 capsule 0   No facility-administered medications prior to visit.     Allergies:  Allergies  Allergen Reactions  . Prevalite [Cholestyramine Light] Hives  . Ace Inhibitors Rash      Review of Systems: See HPI for  pertinent ROS. All other ROS negative.    Physical Exam: Blood pressure 128/88, pulse 79, temperature 98 F (36.7 C), temperature source Oral, resp. rate 16, weight 216 lb (98 kg), SpO2 98 %., Body mass index is 35.94 kg/m. General:  [name]. Appears in no acute distress. Neck: Supple. No thyromegaly. No lymphadenopathy. Lungs: Clear bilaterally to auscultation without wheezes, rales, or rhonchi. Breathing is unlabored. Heart: Regular rhythm. No murmurs, rubs, or gallops. Abdomen: Soft, non-tender, non-distended with normoactive bowel sounds. No hepatomegaly. No rebound/guarding. No obvious abdominal masses. Msk:  Strength and tone normal for age. No costophrenic angle tenderness with percussion bilaterally. Extremities/Skin: Warm and dry. Neuro: Alert and oriented X 3. Moves all extremities spontaneously. Gait is normal. CNII-XII grossly in tact. Psych:  Responds to questions appropriately with a normal affect.   Results for orders placed or performed in visit on 06/04/16  Urinalysis, Routine w reflex microscopic  Result Value Ref Range   Color, Urine YELLOW YELLOW   APPearance CLOUDY (A) CLEAR   Specific Gravity, Urine 1.030 1.001 - 1.035   pH 5.5 5.0 - 8.0   Glucose, UA NEGATIVE NEGATIVE   Bilirubin Urine NEGATIVE NEGATIVE   Ketones, ur NEGATIVE NEGATIVE   Hgb urine dipstick 3+ (A) NEGATIVE   Protein, ur 2+ (A) NEGATIVE   Nitrite  POSITIVE (A) NEGATIVE   Leukocytes, UA TRACE (A) NEGATIVE  Urine Microscopic  Result Value Ref Range   WBC, UA 20-40 (A) <=5 WBC/HPF   RBC / HPF 40-60 (A) <=2 RBC/HPF   Squamous Epithelial / LPF 0-5 <=5 HPF   Bacteria, UA MODERATE (A) NONE SEEN HPF   Crystals NONE SEEN NONE SEEN HPF   Casts NONE SEEN NONE SEEN LPF   Yeast NONE SEEN NONE SEEN HPF     ASSESSMENT AND PLAN:  71 y.o. year old female with  1. Acute cystitis with hematuria Start Cipro immediately. Take as directed. Follow-up if symptoms worsen or do not resolve upon completion of  antibiotic. As well we'll follow-up with urine culture. - ciprofloxacin (CIPRO) 500 MG tablet; Take 1 tablet (500 mg total) by mouth 2 (two) times daily.  Dispense: 14 tablet; Refill: 0 - Urine culture  2. Dysuria - Urinalysis, Routine w reflex microscopic   Signed, [address] [place], [place], [facility] 06/04/2016 3:53 PM

## 2016-06-07 LAB — URINE CULTURE

## 2016-06-20 ENCOUNTER — Other Ambulatory Visit: Payer: Self-pay

## 2016-06-20 ENCOUNTER — Other Ambulatory Visit: Payer: Self-pay | Admitting: Allergy and Immunology

## 2016-06-20 DIAGNOSIS — I10 Essential (primary) hypertension: Secondary | ICD-10-CM

## 2016-06-20 DIAGNOSIS — J449 Chronic obstructive pulmonary disease, unspecified: Secondary | ICD-10-CM

## 2016-06-20 DIAGNOSIS — J439 Emphysema, unspecified: Secondary | ICD-10-CM

## 2016-06-20 DIAGNOSIS — J209 Acute bronchitis, unspecified: Secondary | ICD-10-CM

## 2016-06-20 DIAGNOSIS — K219 Gastro-esophageal reflux disease without esophagitis: Secondary | ICD-10-CM

## 2016-06-20 DIAGNOSIS — J45901 Unspecified asthma with (acute) exacerbation: Secondary | ICD-10-CM

## 2016-06-20 DIAGNOSIS — J4489 Other specified chronic obstructive pulmonary disease: Secondary | ICD-10-CM

## 2016-06-20 MED ORDER — BUDESONIDE-FORMOTEROL FUMARATE 160-4.5 MCG/ACT IN AERO: 2.0000 | INHALATION_SPRAY | Freq: Two times a day (BID) | RESPIRATORY_TRACT | 3 refills | Status: DC

## 2016-06-20 MED ORDER — ALBUTEROL SULFATE HFA 108 (90 BASE) MCG/ACT IN AERS: 2.0000 | INHALATION_SPRAY | Freq: Four times a day (QID) | RESPIRATORY_TRACT | 3 refills | Status: DC | PRN

## 2016-07-31 ENCOUNTER — Other Ambulatory Visit: Payer: Self-pay

## 2016-07-31 DIAGNOSIS — J449 Chronic obstructive pulmonary disease, unspecified: Secondary | ICD-10-CM

## 2016-07-31 MED ORDER — BUDESONIDE-FORMOTEROL FUMARATE 160-4.5 MCG/ACT IN AERO: 2.0000 | INHALATION_SPRAY | Freq: Two times a day (BID) | RESPIRATORY_TRACT | 3 refills | Status: DC

## 2016-07-31 NOTE — Telephone Encounter (Signed)
Refill appropriate 

## 2016-08-02 ENCOUNTER — Other Ambulatory Visit: Payer: Self-pay

## 2016-08-02 DIAGNOSIS — J449 Chronic obstructive pulmonary disease, unspecified: Secondary | ICD-10-CM

## 2016-08-10 ENCOUNTER — Other Ambulatory Visit: Payer: Self-pay

## 2016-08-10 DIAGNOSIS — J449 Chronic obstructive pulmonary disease, unspecified: Secondary | ICD-10-CM

## 2016-08-10 MED ORDER — BUDESONIDE-FORMOTEROL FUMARATE 160-4.5 MCG/ACT IN AERO: 2.0000 | INHALATION_SPRAY | Freq: Two times a day (BID) | RESPIRATORY_TRACT | 3 refills | Status: DC

## 2016-08-10 NOTE — Telephone Encounter (Signed)
Refill appropriate 

## 2016-08-14 ENCOUNTER — Other Ambulatory Visit: Payer: Self-pay | Admitting: Physician Assistant

## 2016-08-14 DIAGNOSIS — K219 Gastro-esophageal reflux disease without esophagitis: Secondary | ICD-10-CM

## 2016-08-15 NOTE — Telephone Encounter (Signed)
Refill appropriate 

## 2016-11-02 ENCOUNTER — Other Ambulatory Visit: Payer: Self-pay | Admitting: Family Medicine

## 2016-11-02 DIAGNOSIS — I10 Essential (primary) hypertension: Secondary | ICD-10-CM

## 2016-11-02 NOTE — Telephone Encounter (Signed)
Medication refill for one time only.  Patient needs to be seen.  Letter sent for patient to call and schedule 

## 2016-11-12 ENCOUNTER — Other Ambulatory Visit: Payer: Self-pay | Admitting: Family Medicine

## 2016-11-12 NOTE — Telephone Encounter (Signed)
Medication refilled per protocol. 

## 2016-11-13 ENCOUNTER — Other Ambulatory Visit: Payer: Self-pay | Admitting: Physician Assistant

## 2016-11-13 DIAGNOSIS — K219 Gastro-esophageal reflux disease without esophagitis: Secondary | ICD-10-CM

## 2016-11-13 NOTE — Telephone Encounter (Signed)
Medication refill for one time only.  Patient needs to be seen.  Letter sent for patient to call and schedule 

## 2016-11-27 ENCOUNTER — Other Ambulatory Visit: Payer: Self-pay

## 2016-11-27 DIAGNOSIS — I10 Essential (primary) hypertension: Secondary | ICD-10-CM

## 2016-11-27 NOTE — Telephone Encounter (Signed)
Patient due for an appointment

## 2016-11-28 ENCOUNTER — Other Ambulatory Visit: Payer: Self-pay

## 2016-11-28 DIAGNOSIS — I10 Essential (primary) hypertension: Secondary | ICD-10-CM

## 2016-11-29 ENCOUNTER — Other Ambulatory Visit: Payer: Self-pay | Admitting: Physician Assistant

## 2016-11-29 DIAGNOSIS — I10 Essential (primary) hypertension: Secondary | ICD-10-CM

## 2016-11-29 NOTE — Telephone Encounter (Signed)
Refill appropriate 

## 2016-12-04 ENCOUNTER — Other Ambulatory Visit: Payer: Self-pay | Admitting: Physician Assistant

## 2016-12-04 DIAGNOSIS — K219 Gastro-esophageal reflux disease without esophagitis: Secondary | ICD-10-CM

## 2016-12-14 ENCOUNTER — Other Ambulatory Visit: Payer: Self-pay

## 2016-12-14 DIAGNOSIS — I10 Essential (primary) hypertension: Secondary | ICD-10-CM

## 2016-12-14 MED ORDER — HYDROCHLOROTHIAZIDE 25 MG PO TABS: 25.0000 mg | ORAL_TABLET | Freq: Every day | ORAL | 0 refills | Status: DC

## 2016-12-24 ENCOUNTER — Encounter: Payer: Self-pay | Admitting: Physician Assistant

## 2016-12-24 ENCOUNTER — Other Ambulatory Visit: Payer: Self-pay

## 2016-12-24 ENCOUNTER — Ambulatory Visit: Payer: PPO | Admitting: Physician Assistant

## 2016-12-24 VITALS — BP 140/82 | HR 81 | Temp 98.1°F | Resp 14 | Wt 212.0 lb

## 2016-12-24 DIAGNOSIS — J988 Other specified respiratory disorders: Secondary | ICD-10-CM | POA: Diagnosis not present

## 2016-12-24 DIAGNOSIS — B9689 Other specified bacterial agents as the cause of diseases classified elsewhere: Principal | ICD-10-CM

## 2016-12-24 MED ORDER — AZITHROMYCIN 250 MG PO TABS: ORAL_TABLET | ORAL | 0 refills | Status: AC

## 2016-12-24 MED ORDER — HYDROCODONE-HOMATROPINE 5-1.5 MG/5ML PO SYRP: 5.0000 mL | ORAL_SOLUTION | Freq: Four times a day (QID) | ORAL | 0 refills | Status: DC | PRN

## 2016-12-24 NOTE — Progress Notes (Signed)
Patient ID: Amanda Pope MRN: [id number], DOB: 1945/04/13, 71 y.o. Date of Encounter: 12/24/2016, 12:49 PM    Chief Complaint:  Chief Complaint  Patient presents with  . Cough  . Headache  . nasal drainage     HPI: 71 y.o. year old female presents with above.   Reports that symptoms started 8 days ago.  At that time had some headache and her throat felt irritated.  Following day she felt even worse.  Since then she has been having a lot of runny nose says that she has gone through lots of tissues.  Says that she has been using [facility] cold and sinus medicine.  Also some DayQuil and NyQuil at other times.  Says that her nose "pours" drainage at times and then she coughs all night.  Has been getting very little sleep.  No known fevers or chills.  No significant sore throat.  No ear ache.     Home Meds:   Outpatient Medications Prior to Visit  Medication Sig Dispense Refill  . albuterol (PROVENTIL HFA;VENTOLIN HFA) 108 (90 Base) MCG/ACT inhaler Inhale 2 puffs into the lungs every 6 (six) hours as needed for wheezing or shortness of breath. 1 Inhaler 3  . aspirin EC 325 MG tablet Take 1 tablet (325 mg total) by mouth 2 (two) times daily. 84 tablet 0  . budesonide-formoterol (SYMBICORT) 160-4.5 MCG/ACT inhaler Inhale 2 puffs into the lungs 2 (two) times daily. 10.2 Inhaler 3  . calcium gluconate 500 MG tablet Take 1 tablet (500 mg total) by mouth 3 (three) times daily. 30 tablet 11  . Cholecalciferol (VITAMIN D3) 2000 units capsule Take 2 daily 60 capsule 11  . estradiol (ESTRACE) 0.5 MG tablet TAKE 1 TABLET BY MOUTH EVERY DAY 90 tablet 0  . hydrochlorothiazide (HYDRODIURIL) 25 MG tablet Take 1 tablet (25 mg total) daily by mouth. 30 tablet 0  . levocetirizine (XYZAL) 5 MG tablet TAKE 1 TABLET (5 MG TOTAL) BY MOUTH EVERY EVENING. 90 tablet 3  . omeprazole (PRILOSEC) 20 MG capsule TAKE 1 CAPSULE BY MOUTH EVERY DAY 90 capsule 0  . ciprofloxacin (CIPRO) 500 MG tablet Take 1 tablet (500 mg  total) by mouth 2 (two) times daily. 14 tablet 0  . Esomeprazole Magnesium (NEXIUM PO) Take 1 capsule by mouth every other day. OTC    . omeprazole (PRILOSEC) 20 MG capsule TAKE 1 CAPSULE BY MOUTH EVERY DAY 90 capsule 0   No facility-administered medications prior to visit.     Allergies:  Allergies  Allergen Reactions  . Prevalite [Cholestyramine Light] Hives  . Ace Inhibitors Rash      Review of Systems: See HPI for pertinent ROS. All other ROS negative.    Physical Exam: Blood pressure 140/82, pulse 81, temperature 98.1 F (36.7 C), temperature source Oral, resp. rate 14, weight 96.2 kg (212 lb), SpO2 95 %., Body mass index is 35.28 kg/m. General: WNWD WF.  Appears in no acute distress. HEENT: Normocephalic, atraumatic, eyes without discharge, sclera non-icteric, nares are without discharge. Bilateral auditory canals clear, TM's are without perforation, pearly grey and translucent with reflective cone of light bilaterally. Oral cavity moist, posterior pharynx without exudate, erythema, peritonsillar abscess.  Neck: Supple. No thyromegaly. No lymphadenopathy. Lungs: Clear bilaterally to auscultation without wheezes, rales, or rhonchi. Breathing is unlabored. Heart: Regular rhythm. No murmurs, rubs, or gallops. Msk:  Strength and tone normal for age. Extremities/Skin: Warm and dry.  Neuro: Alert and oriented X 3. Moves all extremities spontaneously.  Gait is normal. CNII-XII grossly in tact. Psych:  Responds to questions appropriately with a normal affect.     ASSESSMENT AND PLAN:  71 y.o. year old female with  1. Bacterial respiratory infection She can use the Hycodan as needed as cough suppressant especially at night so that she can get some sleep. Take the antibiotic as directed.  Follow-up if symptoms do not resolve within 1 week after completion of antibiotic. - azithromycin (ZITHROMAX Z-PAK) 250 MG tablet; Take 2 tablets (500 mg) on  Day 1,  followed by 1 tablet (250 mg)  once daily on Days 2 through 5.  Dispense: 6 each; Refill: 0 - HYDROcodone-homatropine (HYCODAN) 5-1.5 MG/5ML syrup; Take 5 mLs by mouth every 6 (six) hours as needed for cough.  Dispense: 120 mL; Refill: 0   Signed, [address] [place], [place], [facility] 12/24/2016 12:49 PM

## 2016-12-25 ENCOUNTER — Telehealth: Payer: Self-pay

## 2016-12-25 MED ORDER — HYDROCOD POLST-CPM POLST ER 10-8 MG/5ML PO SUER: 5.0000 mL | Freq: Two times a day (BID) | ORAL | 0 refills | Status: DC | PRN

## 2016-12-25 NOTE — Telephone Encounter (Signed)
Approved----will have to get Dr. Buelah Manis to sign printed Rx Tussionex--- 62ml Q 12 Hr prn cough (whatever amount it comes in--I'm not sure)-- + 0

## 2016-12-25 NOTE — Telephone Encounter (Signed)
Dr Buelah Manis please see note below will you please sign. Thanks

## 2016-12-25 NOTE — Telephone Encounter (Signed)
Patient called complaining of no relief with Hydrocodone cough syrup, Patient states she took the medicine every 3 hours and was up all night with the cough. Patient is requested rx for Tussionex. Pls advise

## 2016-12-25 NOTE — Telephone Encounter (Signed)
Noted , prescription signed

## 2017-01-29 ENCOUNTER — Other Ambulatory Visit: Payer: Self-pay | Admitting: Physician Assistant

## 2017-01-29 DIAGNOSIS — I10 Essential (primary) hypertension: Secondary | ICD-10-CM

## 2017-02-07 ENCOUNTER — Telehealth: Payer: Self-pay | Admitting: *Deleted

## 2017-02-07 DIAGNOSIS — R059 Cough, unspecified: Secondary | ICD-10-CM

## 2017-02-07 DIAGNOSIS — R52 Pain, unspecified: Secondary | ICD-10-CM

## 2017-02-07 DIAGNOSIS — R05 Cough: Secondary | ICD-10-CM

## 2017-02-07 NOTE — Telephone Encounter (Signed)
Call placed to patient and patient made aware.   States that she will have X-ray before appointment.

## 2017-02-07 NOTE — Telephone Encounter (Signed)
Received call from patient.   Reports that she has nonproductive cough, low grade fever and HA. States that she also has sharp pain while taking deep breath that is under ribs and radiates through to back.   Appointment scheduled for 02/08/2017.   Patient states that she thinks she may have pleurisy. Reports that if symptoms worsen she will go to [facility] for eval.   MD to be made aware.

## 2017-02-07 NOTE — Telephone Encounter (Signed)
CXR ordered.   Call placed to patient. LMTRC.  

## 2017-02-07 NOTE — Telephone Encounter (Signed)
Please order and have patient go get CXR today.  Thanks

## 2017-02-08 ENCOUNTER — Encounter: Payer: Self-pay | Admitting: Family Medicine

## 2017-02-08 ENCOUNTER — Telehealth: Payer: Self-pay | Admitting: Family Medicine

## 2017-02-08 ENCOUNTER — Ambulatory Visit
Admission: RE | Admit: 2017-02-08 | Discharge: 2017-02-08 | Disposition: A | Discharge status: Home / Self Care | Payer: PPO | Source: Ambulatory Visit | Attending: Family Medicine | Admitting: Family Medicine

## 2017-02-08 ENCOUNTER — Ambulatory Visit (INDEPENDENT_AMBULATORY_CARE_PROVIDER_SITE_OTHER): Payer: PPO | Admitting: Family Medicine

## 2017-02-08 VITALS — BP 146/84 | HR 88 | Temp 98.8°F | Resp 18 | Ht 64.0 in | Wt 214.0 lb

## 2017-02-08 DIAGNOSIS — J441 Chronic obstructive pulmonary disease with (acute) exacerbation: Secondary | ICD-10-CM

## 2017-02-08 DIAGNOSIS — J439 Emphysema, unspecified: Secondary | ICD-10-CM

## 2017-02-08 DIAGNOSIS — R079 Chest pain, unspecified: Secondary | ICD-10-CM

## 2017-02-08 DIAGNOSIS — J45901 Unspecified asthma with (acute) exacerbation: Secondary | ICD-10-CM

## 2017-02-08 DIAGNOSIS — J209 Acute bronchitis, unspecified: Secondary | ICD-10-CM

## 2017-02-08 DIAGNOSIS — R0602 Shortness of breath: Secondary | ICD-10-CM

## 2017-02-08 MED ORDER — IPRATROPIUM-ALBUTEROL 20-100 MCG/ACT IN AERS: 1.0000 | INHALATION_SPRAY | Freq: Four times a day (QID) | RESPIRATORY_TRACT | 4 refills | Status: DC | PRN

## 2017-02-08 MED ORDER — PREDNISONE 20 MG PO TABS: ORAL_TABLET | ORAL | 0 refills | Status: DC

## 2017-02-08 MED ORDER — ALBUTEROL SULFATE HFA 108 (90 BASE) MCG/ACT IN AERS: 2.0000 | INHALATION_SPRAY | Freq: Four times a day (QID) | RESPIRATORY_TRACT | 3 refills | Status: DC | PRN

## 2017-02-08 MED ORDER — LEVOFLOXACIN 500 MG PO TABS: 500.0000 mg | ORAL_TABLET | Freq: Every day | ORAL | 0 refills | Status: DC

## 2017-02-08 NOTE — Telephone Encounter (Signed)
She can use albuterol 2 puffs inhaled every 6 hours as needed

## 2017-02-08 NOTE — Telephone Encounter (Signed)
Pt called and LMOVM stating that the spray you give her is $160 and would like to know if you can change it to something different?

## 2017-02-08 NOTE — Progress Notes (Signed)
Subjective:    Patient ID: Amanda Pope, female    DOB: 12-20-1945, 72 y.o.   MRN: [id number]  HPI Symptoms started Tuesday with left posterior pleurisy. Patient developed pain just below the left shoulder blade whenever she would take a deep breath in. She awoke that night with fevers that were subjective and a nonproductive cough. The pain has migrated now and is now located anteriorly below the sternum. It occurs only when she takes a deep breath in or coughs. Without coughing, there is no pain present. She reports worsening cough, occasional subjective fevers, shortness of breath, and wheezing. On examination today, the patient has markedly diminished breath sounds bilaterally with fine expiratory wheezes and poor air exchange. There are no crackles Rales appreciated on exam. EKG machine has malfunctioned unfortunately and  I'm unable to get an EKG.  Patient has not yet had a chest x-ray Past Medical History:  Diagnosis Date  . Arthritis    hip & back  . Asthma   . COPD (chronic obstructive pulmonary disease) ([place])   . Cystocele   . Depression   . GERD (gastroesophageal reflux disease)   . History of Helicobacter pylori infection   . Hypertension   . Shortness of breath dyspnea   . Urticaria   . Vitamin D deficiency    Past Surgical History:  Procedure Laterality Date  . ABDOMINAL HYSTERECTOMY     And Removed 1 ovary  . APPENDECTOMY    . CHOLECYSTECTOMY  05/2007  . TONSILLECTOMY    . TOTAL HIP ARTHROPLASTY Right 06/09/2015  . TOTAL HIP ARTHROPLASTY Right 06/09/2015   Procedure: RIGHT TOTAL HIP ARTHROPLASTY ANTERIOR APPROACH;  Surgeon: [name], MD;  Location: [facility];  Service: Orthopedics;  Laterality: Right;  . TUBAL LIGATION     Current Outpatient Medications on File Prior to Visit  Medication Sig Dispense Refill  . albuterol (PROVENTIL HFA;VENTOLIN HFA) 108 (90 Base) MCG/ACT inhaler Inhale 2 puffs into the lungs every 6 (six) hours as needed for wheezing or shortness of  breath. 1 Inhaler 3  . aspirin EC 325 MG tablet Take 1 tablet (325 mg total) by mouth 2 (two) times daily. 84 tablet 0  . budesonide-formoterol (SYMBICORT) 160-4.5 MCG/ACT inhaler Inhale 2 puffs into the lungs 2 (two) times daily. 10.2 Inhaler 3  . calcium gluconate 500 MG tablet Take 1 tablet (500 mg total) by mouth 3 (three) times daily. 30 tablet 11  . Cholecalciferol (VITAMIN D3) 2000 units capsule Take 2 daily 60 capsule 11  . estradiol (ESTRACE) 0.5 MG tablet TAKE 1 TABLET BY MOUTH EVERY DAY 90 tablet 0  . hydrochlorothiazide (HYDRODIURIL) 25 MG tablet TAKE 1 TABLET (25 MG TOTAL) DAILY BY MOUTH. 30 tablet 0  . levocetirizine (XYZAL) 5 MG tablet TAKE 1 TABLET (5 MG TOTAL) BY MOUTH EVERY EVENING. 90 tablet 3  . omeprazole (PRILOSEC) 20 MG capsule TAKE 1 CAPSULE BY MOUTH EVERY DAY 90 capsule 0   No current facility-administered medications on file prior to visit.    Allergies  Allergen Reactions  . Prevalite [Cholestyramine Light] Hives  . Ace Inhibitors Rash   Social History   Socioeconomic History  . Marital status: Married    Spouse name: Not on file  . Number of children: Not on file  . Years of education: Not on file  . Highest education level: Not on file  Social Needs  . Financial resource strain: Not on file  . Food insecurity - worry: Not on file  .  Food insecurity - inability: Not on file  . Transportation needs - medical: Not on file  . Transportation needs - non-medical: Not on file  Occupational History  . Not on file  Tobacco Use  . Smoking status: Former Smoker    Packs/day: 1.00    Types: Cigarettes    Start date: 01/29/1965    Last attempt to quit: 10/30/2010    Years since quitting: 6.2  . Smokeless tobacco: Never Used  Substance and Sexual Activity  . Alcohol use: No    Alcohol/week: 0.0 oz  . Drug use: No  . Sexual activity: Not on file  Other Topics Concern  . Not on file  Social History Narrative   Entered 07/2013:   Is retired. Keeps [facility]  Child. Loves it.   Married.      Review of Systems  All other systems reviewed and are negative.      Objective:   Physical Exam  Constitutional: She appears well-developed and well-nourished. No distress.  Neck: No JVD present.  Cardiovascular: Normal rate, regular rhythm and normal heart sounds. Exam reveals no gallop and no friction rub.  No murmur heard. Pulmonary/Chest: Effort normal. No accessory muscle usage. No respiratory distress. She has decreased breath sounds in the right upper field, the right lower field, the left upper field and the left lower field. She has wheezes in the right upper field, the right lower field, the left upper field and the left lower field. She has no rhonchi. She has no rales.        Musculoskeletal: She exhibits no edema.  Skin: She is not diaphoretic.  Vitals reviewed. Location of pleurisy outlined in purple.         Assessment & Plan:  Chest pain, unspecified type - Plan: EKG 12-Lead, DG Chest 2 View  SOB (shortness of breath) - Plan: EKG 12-Lead, DG Chest 2 View  COPD exacerbation (HCC) - Plan: DG Chest 2 View, predniSONE (DELTASONE) 20 MG tablet, levofloxacin (LEVAQUIN) 500 MG tablet   Patient's physical exam is concerning for bronchospasm, decreased air movement, and expiratory wheezing. I'm concerned that the patient may have underlying pneumonia with a possible COPD exacerbation. Patient was given a DuoNeb 2.5 mg/0.5 mg nebulizer treatment in the office in approximately 10:00.  Will reassess and 15 minutes.  The patient improved dramatically after the DuraNeb. We were finally able to get the EKG machine working. EKG shows normal sinus rhythm with nonspecific ST changes in the lateral leads but no evidence of an ischemia or infarction. EKG is essentially unchanged from an EKG dated 2017. Patient is feeling much better after the breathing treatment. I will send her for a chest x-ray but I'll treat the patient as possible pneumonia  with underlying COPD exacerbation. Also on the differential diagnosis includes pulmonary embolism and aortic dissection. However I feel that these are highly unlikely given the improvement after 1 DuoNeb. I did explain to the patient that she she go the emergency room immediately should her symptoms worsen.

## 2017-02-08 NOTE — Telephone Encounter (Signed)
Medication called/sent to requested pharmacy and pt aware 

## 2017-02-18 ENCOUNTER — Other Ambulatory Visit: Payer: Self-pay | Admitting: Physician Assistant

## 2017-02-18 NOTE — Telephone Encounter (Signed)
Refill appropriate 

## 2017-02-22 ENCOUNTER — Other Ambulatory Visit: Payer: Self-pay | Admitting: Physician Assistant

## 2017-02-25 NOTE — Telephone Encounter (Signed)
Refill appropriate 

## 2017-02-27 ENCOUNTER — Other Ambulatory Visit: Payer: Self-pay | Admitting: Physician Assistant

## 2017-02-27 DIAGNOSIS — I10 Essential (primary) hypertension: Secondary | ICD-10-CM

## 2017-02-27 NOTE — Telephone Encounter (Signed)
Patient is due for an office visit. Letter mailed for patient to call and schedule an appointment.

## 2017-03-04 ENCOUNTER — Other Ambulatory Visit: Payer: Self-pay | Admitting: Physician Assistant

## 2017-03-04 DIAGNOSIS — K219 Gastro-esophageal reflux disease without esophagitis: Secondary | ICD-10-CM

## 2017-03-04 NOTE — Telephone Encounter (Signed)
rx filled per protocol. Patient is due for an office visit letter mailed for patient to call and schedule an appointment

## 2017-03-07 ENCOUNTER — Encounter: Payer: Self-pay | Admitting: Physician Assistant

## 2017-03-07 ENCOUNTER — Ambulatory Visit (INDEPENDENT_AMBULATORY_CARE_PROVIDER_SITE_OTHER): Payer: PPO | Admitting: Physician Assistant

## 2017-03-07 ENCOUNTER — Other Ambulatory Visit: Payer: Self-pay

## 2017-03-07 VITALS — BP 140/86 | HR 81 | Temp 98.1°F | Resp 14 | Ht 64.0 in | Wt 207.8 lb

## 2017-03-07 DIAGNOSIS — J439 Emphysema, unspecified: Secondary | ICD-10-CM | POA: Diagnosis not present

## 2017-03-07 DIAGNOSIS — J449 Chronic obstructive pulmonary disease, unspecified: Secondary | ICD-10-CM | POA: Diagnosis not present

## 2017-03-07 DIAGNOSIS — J441 Chronic obstructive pulmonary disease with (acute) exacerbation: Secondary | ICD-10-CM

## 2017-03-07 MED ORDER — METHYLPREDNISOLONE ACETATE 80 MG/ML IJ SUSP
80.0000 mg | Freq: Once | INTRAMUSCULAR | Status: AC
  Administered 2017-03-07: 80 mg via INTRAMUSCULAR

## 2017-03-07 MED ORDER — PREDNISONE 20 MG PO TABS: ORAL_TABLET | ORAL | 0 refills | Status: DC

## 2017-03-07 MED ORDER — IPRATROPIUM-ALBUTEROL 0.5-2.5 (3) MG/3ML IN SOLN
3.0000 mL | Freq: Once | RESPIRATORY_TRACT | Status: AC
  Administered 2017-03-07: 3 mL via RESPIRATORY_TRACT

## 2017-03-07 MED ORDER — TIOTROPIUM BROMIDE MONOHYDRATE 18 MCG IN CAPS: 18.0000 ug | ORAL_CAPSULE | Freq: Every day | RESPIRATORY_TRACT | 2 refills | Status: DC

## 2017-03-07 MED ORDER — LEVOFLOXACIN 500 MG PO TABS: 500.0000 mg | ORAL_TABLET | Freq: Every day | ORAL | 0 refills | Status: DC

## 2017-03-07 NOTE — Progress Notes (Signed)
Patient ID: Amanda Pope MRN: [id number], DOB: 1945/11/10, 72 y.o. Date of Encounter: @DATE @  Chief Complaint:  Chief Complaint  Patient presents with  . Wheezing  . Nausea  . Cough    x1week  . Fatigue    HPI: 72 y.o. year old female  presents with above.  She reports that she has been having the above symptoms.  States that she also had bad headache and had some vomiting but that has resolved.  Still feels some nausea.  Now has very bad chest congestion.  When she tries to lay back to go to bed feels very congested in her chest.  Has been experiencing some wheezing.  Uses her Symbicort routinely as directed.  States that in general she rarely needs her albuterol but has been using it the last several days.  Has not used it this morning.  States that she was having fever at the beginning of the illness.   Past Medical History:  Diagnosis Date  . Arthritis    hip & back  . Asthma   . COPD (chronic obstructive pulmonary disease) ([place])   . Cystocele   . Depression   . GERD (gastroesophageal reflux disease)   . History of Helicobacter pylori infection   . Hypertension   . Shortness of breath dyspnea   . Urticaria   . Vitamin D deficiency      Home Meds: Outpatient Medications Prior to Visit  Medication Sig Dispense Refill  . albuterol (PROVENTIL HFA;VENTOLIN HFA) 108 (90 Base) MCG/ACT inhaler Inhale 2 puffs into the lungs every 6 (six) hours as needed for wheezing or shortness of breath. 1 Inhaler 3  . aspirin EC 325 MG tablet Take 1 tablet (325 mg total) by mouth 2 (two) times daily. 84 tablet 0  . budesonide-formoterol (SYMBICORT) 160-4.5 MCG/ACT inhaler Inhale 2 puffs into the lungs 2 (two) times daily. 10.2 Inhaler 3  . calcium gluconate 500 MG tablet Take 1 tablet (500 mg total) by mouth 3 (three) times daily. 30 tablet 11  . Cholecalciferol (VITAMIN D3) 2000 units capsule Take 2 daily 60 capsule 11  . estradiol (ESTRACE) 0.5 MG tablet TAKE 1 TABLET BY MOUTH EVERY  DAY 90 tablet 0  . hydrochlorothiazide (HYDRODIURIL) 25 MG tablet TAKE 1 TABLET (25 MG TOTAL) DAILY BY MOUTH. 30 tablet 0  . levocetirizine (XYZAL) 5 MG tablet TAKE 1 TABLET (5 MG TOTAL) BY MOUTH EVERY EVENING. 90 tablet 3  . omeprazole (PRILOSEC) 20 MG capsule TAKE 1 CAPSULE BY MOUTH EVERY DAY 30 capsule 0  . estradiol (ESTRACE) 0.5 MG tablet TAKE 1 TABLET BY MOUTH EVERY DAY 90 tablet 0  . levofloxacin (LEVAQUIN) 500 MG tablet Take 1 tablet (500 mg total) by mouth daily. 7 tablet 0  . predniSONE (DELTASONE) 20 MG tablet 3 tabs poqday 1-2, 2 tabs poqday 3-4, 1 tab poqday 5-6 12 tablet 0   No facility-administered medications prior to visit.     Allergies:  Allergies  Allergen Reactions  . Prevalite [Cholestyramine Light] Hives  . Ace Inhibitors Rash    Social History   Socioeconomic History  . Marital status: Married    Spouse name: Not on file  . Number of children: Not on file  . Years of education: Not on file  . Highest education level: Not on file  Social Needs  . Financial resource strain: Not on file  . Food insecurity - worry: Not on file  . Food insecurity - inability: Not on file  .  Transportation needs - medical: Not on file  . Transportation needs - non-medical: Not on file  Occupational History  . Not on file  Tobacco Use  . Smoking status: Former Smoker    Packs/day: 1.00    Types: Cigarettes    Start date: 01/29/1965    Last attempt to quit: 10/30/2010    Years since quitting: 6.3  . Smokeless tobacco: Never Used  Substance and Sexual Activity  . Alcohol use: No    Alcohol/week: 0.0 oz  . Drug use: No  . Sexual activity: Not on file  Other Topics Concern  . Not on file  Social History Narrative   Entered 07/2013:   Is retired. 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 Loves it.   Married.    Family History  Problem Relation Age of Onset  . Cancer Mother        colon  . Cancer Sister        ovarian  . Cancer Brother        melanoma  . Cancer Brother 43        colon cancer     Review of Systems:  See HPI for pertinent ROS. All other ROS negative.    Physical Exam: Blood pressure 140/86, pulse 81, temperature 98.1 F (36.7 C), temperature source Oral, resp. rate 14, height 5\' 4"  (1.626 m), weight 94.3 kg (207 lb 12.8 oz), SpO2 93 %., Body mass index is 35.67 kg/m. General: [name]. Appears in no acute distress. Head: Normocephalic, atraumatic, eyes without discharge, sclera non-icteric, nares are without discharge. Bilateral auditory canals clear, TM's are without perforation, pearly grey and translucent with reflective cone of light bilaterally. Oral cavity moist, posterior pharynx without exudate, erythema, peritonsillar abscess.  Neck: Supple. No thyromegaly. No lymphadenopathy. Lungs: Harsh, tight wheezes throughout bilaterally.  Oxygen saturation 93% on room air. Heart: RRR with S1 S2. No murmurs, rubs, or gallops. Musculoskeletal:  Strength and tone normal for age. Extremities/Skin: Warm and dry. Neuro: Alert and oriented X 3. Moves all extremities spontaneously. Gait is normal. CNII-XII grossly in tact. Psych:  Responds to questions appropriately with a normal affect.     ASSESSMENT AND PLAN:  72 y.o. year old female with  1. Asthma with COPD Noted in the computer that she has been seen and evaluated by the asthma and allergy specialist in the past.  2. COPD exacerbation ([place])  also reviewed in the computer her visit with Dr. Dennard Schaumann on 02/08/17 with COPD exacerbation. Because she is having frequent COPD exacerbations I will add Spiriva for her to use as a maintenance medication in addition to the Symbicort. I have discussed the role of these medications with her and she voices understanding.  For this acute exacerbation: In the office we are giving her a DuoNeb nebulizer treatment. In the office we are giving Depo-Medrol 80 mg IM. Starting tomorrow she will start the oral prednisone 40 mg daily for 5 days. Will start the Levaquin  today and take as directed and complete that. She will continue to use the albuterol 2 puffs every 4 hours as needed for wheezing.  Discussed indications for her to go to the emergency room.  Discussed indications for her to follow-up with [place].  She voices understanding and agrees.   - levofloxacin (LEVAQUIN) 500 MG tablet; Take 1 tablet (500 mg total) by mouth daily.  Dispense: 7 tablet; Refill: 0 - predniSONE (DELTASONE) 20 MG tablet; Take 2 daily for 5 days.  Dispense: 10 tablet; Refill: 0 -  tiotropium (SPIRIVA HANDIHALER) 18 MCG inhalation capsule; Place 1 capsule (18 mcg total) into inhaler and inhale daily.  Dispense: 30 capsule; Refill: 2  3. Pulmonary emphysema, unspecified emphysema type ([place])  - tiotropium (SPIRIVA HANDIHALER) 18 MCG inhalation capsule; Place 1 capsule (18 mcg total) into inhaler and inhale daily.  Dispense: 30 capsule; Refill: 2   Signed, [address] [place], [place], [facility] 03/07/2017 11:31 AM

## 2017-03-19 DIAGNOSIS — H40003 Preglaucoma, unspecified, bilateral: Secondary | ICD-10-CM | POA: Diagnosis not present

## 2017-03-31 ENCOUNTER — Other Ambulatory Visit: Payer: Self-pay | Admitting: Physician Assistant

## 2017-03-31 DIAGNOSIS — I10 Essential (primary) hypertension: Secondary | ICD-10-CM

## 2017-04-01 NOTE — Telephone Encounter (Signed)
Refill appropriate 

## 2017-04-18 ENCOUNTER — Other Ambulatory Visit: Payer: Self-pay

## 2017-04-18 DIAGNOSIS — K219 Gastro-esophageal reflux disease without esophagitis: Secondary | ICD-10-CM

## 2017-04-18 MED ORDER — OMEPRAZOLE 20 MG PO CPDR: DELAYED_RELEASE_CAPSULE | ORAL | 1 refills | Status: DC

## 2017-04-18 NOTE — Telephone Encounter (Signed)
Refill appropriate 

## 2017-05-02 ENCOUNTER — Other Ambulatory Visit: Payer: Self-pay | Admitting: Physician Assistant

## 2017-05-02 DIAGNOSIS — I10 Essential (primary) hypertension: Secondary | ICD-10-CM

## 2017-05-15 ENCOUNTER — Other Ambulatory Visit: Payer: Self-pay | Admitting: Family Medicine

## 2017-05-19 ENCOUNTER — Other Ambulatory Visit: Payer: Self-pay | Admitting: Family Medicine

## 2017-05-27 ENCOUNTER — Other Ambulatory Visit: Payer: Self-pay | Admitting: Physician Assistant

## 2017-05-27 DIAGNOSIS — I10 Essential (primary) hypertension: Secondary | ICD-10-CM

## 2017-06-08 ENCOUNTER — Other Ambulatory Visit: Payer: Self-pay | Admitting: Physician Assistant

## 2017-06-10 ENCOUNTER — Other Ambulatory Visit: Payer: Self-pay | Admitting: Physician Assistant

## 2017-06-10 DIAGNOSIS — I10 Essential (primary) hypertension: Secondary | ICD-10-CM

## 2017-07-12 ENCOUNTER — Other Ambulatory Visit: Payer: Self-pay | Admitting: Physician Assistant

## 2017-07-12 DIAGNOSIS — I10 Essential (primary) hypertension: Secondary | ICD-10-CM

## 2017-07-17 ENCOUNTER — Other Ambulatory Visit: Payer: Self-pay | Admitting: Physician Assistant

## 2017-07-17 DIAGNOSIS — I10 Essential (primary) hypertension: Secondary | ICD-10-CM

## 2017-07-18 NOTE — Telephone Encounter (Signed)
Patient is due for office visit with lab work. Letter mailed for her to call and schedule the appointment

## 2017-08-19 ENCOUNTER — Other Ambulatory Visit: Payer: Self-pay | Admitting: Physician Assistant

## 2017-10-09 ENCOUNTER — Other Ambulatory Visit: Payer: Self-pay | Admitting: Physician Assistant

## 2017-10-09 DIAGNOSIS — I10 Essential (primary) hypertension: Secondary | ICD-10-CM

## 2017-10-16 ENCOUNTER — Other Ambulatory Visit: Payer: Self-pay | Admitting: Physician Assistant

## 2017-10-16 DIAGNOSIS — K219 Gastro-esophageal reflux disease without esophagitis: Secondary | ICD-10-CM

## 2017-10-31 ENCOUNTER — Ambulatory Visit (INDEPENDENT_AMBULATORY_CARE_PROVIDER_SITE_OTHER): Payer: PPO

## 2017-10-31 ENCOUNTER — Ambulatory Visit: Payer: PPO | Admitting: Podiatry

## 2017-10-31 DIAGNOSIS — Q828 Other specified congenital malformations of skin: Secondary | ICD-10-CM | POA: Diagnosis not present

## 2017-10-31 DIAGNOSIS — M2011 Hallux valgus (acquired), right foot: Secondary | ICD-10-CM

## 2017-10-31 NOTE — Progress Notes (Signed)
Right: Unknown calcium the calcium stuff 1 around the corner from 1 to [place] there is a fungating [place] look like there is only like for bilobed in the is only other one I think like in [place] letter from the [facility] or something is good we try to go there once every 48months likely in the last year referred her this after Christmas to have issues  Subjective:  Patient ID: Amanda Pope, female    DOB: 30-Aug-1945,  MRN: [id number] HPI Chief Complaint  Patient presents with  . Foot Problem    Right foot bunion on bottom of foot. Burning pain most of the time. Small bumps around bunion.    72 y.o. female presents with the above complaint.   ROS: Denies fever chills nausea vomiting muscle aches pains calf pain back pain chest pain shortness of breath.  Past Medical History:  Diagnosis Date  . Arthritis    hip & back  . Asthma   . COPD (chronic obstructive pulmonary disease) ([place])   . Cystocele   . Depression   . GERD (gastroesophageal reflux disease)   . History of Helicobacter pylori infection   . Hypertension   . Shortness of breath dyspnea   . Urticaria   . Vitamin D deficiency    Past Surgical History:  Procedure Laterality Date  . ABDOMINAL HYSTERECTOMY     And Removed 1 ovary  . APPENDECTOMY    . CHOLECYSTECTOMY  05/2007  . TONSILLECTOMY    . TOTAL HIP ARTHROPLASTY Right 06/09/2015  . TOTAL HIP ARTHROPLASTY Right 06/09/2015   Procedure: RIGHT TOTAL HIP ARTHROPLASTY ANTERIOR APPROACH;  Surgeon: [name], MD;  Location: [facility];  Service: Orthopedics;  Laterality: Right;  . TUBAL LIGATION      Current Outpatient Medications:  .  albuterol (PROVENTIL HFA;VENTOLIN HFA) 108 (90 Base) MCG/ACT inhaler, Inhale 2 puffs into the lungs every 6 (six) hours as needed for wheezing or shortness of breath., Disp: 1 Inhaler, Rfl: 3 .  aspirin EC 325 MG tablet, Take 1 tablet (325 mg total) by mouth 2 (two) times daily., Disp: 84 tablet, Rfl: 0 .  budesonide-formoterol  (SYMBICORT) 160-4.5 MCG/ACT inhaler, Inhale 2 puffs into the lungs 2 (two) times daily., Disp: 10.2 Inhaler, Rfl: 3 .  calcium gluconate 500 MG tablet, Take 1 tablet (500 mg total) by mouth 3 (three) times daily., Disp: 30 tablet, Rfl: 11 .  [employer] D3 2000 units CAPS, TAKE 2 CAPSULES BY MOUTH ONCE A DAY, Disp: 60 capsule, Rfl: 9 .  estradiol (ESTRACE) 0.5 MG tablet, TAKE 1 TABLET BY MOUTH EVERY DAY, Disp: 90 tablet, Rfl: 0 .  hydrochlorothiazide (HYDRODIURIL) 25 MG tablet, TAKE 1 TABLET BY MOUTH EVERY DAY, Disp: 90 tablet, Rfl: 1 .  hydrochlorothiazide (HYDRODIURIL) 25 MG tablet, TAKE 1 TABLET BY MOUTH EVERY DAY, Disp: 30 tablet, Rfl: 0 .  hydrochlorothiazide (HYDRODIURIL) 25 MG tablet, TAKE 1 TABLET BY MOUTH EVERY DAY, Disp: 30 tablet, Rfl: 0 .  levocetirizine (XYZAL) 5 MG tablet, TAKE 1 TABLET (5 MG TOTAL) BY MOUTH EVERY EVENING., Disp: 90 tablet, Rfl: 1 .  levofloxacin (LEVAQUIN) 500 MG tablet, Take 1 tablet (500 mg total) by mouth daily. (Patient not taking: Reported on 10/31/2017), Disp: 7 tablet, Rfl: 0 .  omeprazole (PRILOSEC) 20 MG capsule, TAKE 1 CAPSULE BY MOUTH EVERY DAY, Disp: 90 capsule, Rfl: 1 .  predniSONE (DELTASONE) 20 MG tablet, Take 2 daily for 5 days. (Patient not taking: Reported on 10/31/2017), Disp: 10 tablet,  Rfl: 0 .  tiotropium (SPIRIVA HANDIHALER) 18 MCG inhalation capsule, Place 1 capsule (18 mcg total) into inhaler and inhale daily. (Patient not taking: Reported on 10/31/2017), Disp: 30 capsule, Rfl: 2  Allergies  Allergen Reactions  . Prevalite [Cholestyramine Light] Hives  . Ace Inhibitors Rash   Review of Systems Objective:  There were no vitals filed for this visit.  General: Well developed, nourished, in no acute distress, alert and oriented x3   Dermatological: Skin is warm, dry and supple bilateral. Nails x 10 are well maintained; remaining integument appears unremarkable at this time. There are no open sores, no preulcerative lesions, no rash or signs of  infection present.  Vascular: Dorsalis Pedis artery and Posterior Tibial artery pedal pulses are 2/4 bilateral with immedate capillary fill time. Pedal hair growth present. No varicosities and no lower extremity edema present bilateral.   Neruologic: Grossly intact via light touch bilateral. Vibratory intact via tuning fork bilateral. Protective threshold with Semmes Wienstein monofilament intact to all pedal sites bilateral. Patellar and Achilles deep tendon reflexes 2+ bilateral. No Babinski or clonus noted bilateral.   Musculoskeletal: No gross boney pedal deformities bilateral. No pain, crepitus, or limitation noted with foot and ankle range of motion bilateral. Muscular strength 5/5 in all groups tested bilateral.  Gait: Unassisted, Nonantalgic.    Radiographs:  Radiographs taken today demonstrate basically an osseously mature individual with no major acute findings.  Assessment & Plan:   Assessment: Plantarflexed second metatarsal with reactive porokeratotic lesions  Plan: Debrided all reactive hyperkeratotic tissue and placed salicylic acid under occlusion wash off thoroughly in 3 days otherwise she will not get this wet.  I will follow-up with her in 6 weeks     Elissa Grieshop T. [place], [place]

## 2017-11-07 ENCOUNTER — Other Ambulatory Visit: Payer: Self-pay | Admitting: Physician Assistant

## 2017-11-07 DIAGNOSIS — I10 Essential (primary) hypertension: Secondary | ICD-10-CM

## 2017-11-16 ENCOUNTER — Other Ambulatory Visit: Payer: Self-pay | Admitting: Physician Assistant

## 2017-12-07 ENCOUNTER — Other Ambulatory Visit: Payer: Self-pay | Admitting: Physician Assistant

## 2017-12-07 DIAGNOSIS — I10 Essential (primary) hypertension: Secondary | ICD-10-CM

## 2017-12-11 ENCOUNTER — Other Ambulatory Visit: Payer: Self-pay | Admitting: Physician Assistant

## 2017-12-11 DIAGNOSIS — I10 Essential (primary) hypertension: Secondary | ICD-10-CM

## 2017-12-12 ENCOUNTER — Ambulatory Visit: Payer: PPO | Admitting: Podiatry

## 2017-12-24 ENCOUNTER — Ambulatory Visit (HOSPITAL_COMMUNITY)
Admission: EM | Admit: 2017-12-24 | Discharge: 2017-12-24 | Disposition: A | Discharge status: Home / Self Care | Payer: PPO | Attending: Family Medicine | Admitting: Family Medicine

## 2017-12-24 ENCOUNTER — Encounter (HOSPITAL_COMMUNITY): Payer: Self-pay

## 2017-12-24 DIAGNOSIS — J40 Bronchitis, not specified as acute or chronic: Secondary | ICD-10-CM

## 2017-12-24 MED ORDER — PREDNISONE 50 MG PO TABS: 50.0000 mg | ORAL_TABLET | Freq: Every day | ORAL | 0 refills | Status: DC

## 2017-12-24 MED ORDER — DOXYCYCLINE HYCLATE 100 MG PO CAPS: 100.0000 mg | ORAL_CAPSULE | Freq: Two times a day (BID) | ORAL | 0 refills | Status: DC

## 2017-12-24 MED ORDER — HYDROCOD POLST-CPM POLST ER 10-8 MG/5ML PO SUER: 5.0000 mL | Freq: Every evening | ORAL | 0 refills | Status: DC | PRN

## 2017-12-24 NOTE — Discharge Instructions (Signed)
Start prednisone and doxycycline as directed. Start tussionex as needed for cough. Keep hydrated, your urine should be clear to pale yellow in color. Tylenol/motrin for fever and pain. Monitor for any worsening of symptoms, chest pain, shortness of breath, wheezing, swelling of the throat, follow up for reevaluation.   For sore throat/cough try using a honey-based tea. Use 3 teaspoons of honey with juice squeezed from half lemon. Place shaved pieces of ginger into 1/2-1 cup of water and warm over stove top. Then mix the ingredients and repeat every 4 hours as needed.

## 2017-12-24 NOTE — ED Provider Notes (Signed)
[facility]    CSN: [id number] Arrival date & time: 12/24/17  1000     History   Chief Complaint Chief Complaint  Patient presents with  . Cough    HPI JYL CHICO is a 72 y.o. female.   72 year old female comes in for 2-week history of URI symptoms.  Has had cough, congestion, rhinorrhea.  Now cough is worse at night, and can be productive.  Decreased appetite.  No known fevers, but has had hot spells without chills.  Shortness of breath with wheezing that is mostly at nighttime, and improved after albuterol inhaler.  Former smoker.     Past Medical History:  Diagnosis Date  . Arthritis    hip & back  . Asthma   . COPD (chronic obstructive pulmonary disease) ([place])   . Cystocele   . Depression   . GERD (gastroesophageal reflux disease)   . History of Helicobacter pylori infection   . Hypertension   . Shortness of breath dyspnea   . Urticaria   . Vitamin D deficiency     Patient Active Problem List   Diagnosis Date Noted  . Osteoarthritis of right hip 06/09/2015  . Hip joint replacement status 06/09/2015  . Chronic urticaria 11/23/2014  . Other allergic rhinitis 11/23/2014  . Asthma with COPD 11/23/2014  . Hormone replacement therapy (postmenopausal) 08/27/2013  . Subclinical hypothyroidism 08/27/2013  . Hyperglycemia 08/27/2013  . Cystocele   . Hypertension   . Depression   . History of Helicobacter pylori infection   . Vitamin D deficiency   . COPD (chronic obstructive pulmonary disease) ([place])     Past Surgical History:  Procedure Laterality Date  . ABDOMINAL HYSTERECTOMY     And Removed 1 ovary  . APPENDECTOMY    . CHOLECYSTECTOMY  05/2007  . TONSILLECTOMY    . TOTAL HIP ARTHROPLASTY Right 06/09/2015  . TOTAL HIP ARTHROPLASTY Right 06/09/2015   Procedure: RIGHT TOTAL HIP ARTHROPLASTY ANTERIOR APPROACH;  Surgeon: [name], MD;  Location: [facility];  Service: Orthopedics;  Laterality: Right;  . TUBAL LIGATION      OB History    None      Home Medications    Prior to Admission medications   Medication Sig Start Date End Date Taking? Authorizing Provider  albuterol (PROVENTIL HFA;VENTOLIN HFA) 108 (90 Base) MCG/ACT inhaler Inhale 2 puffs into the lungs every 6 (six) hours as needed for wheezing or shortness of breath. 02/08/17   [name], MD  aspirin EC 325 MG tablet Take 1 tablet (325 mg total) by mouth 2 (two) times daily. 06/09/15   [name], MD  budesonide-formoterol [facility]) 160-4.5 MCG/ACT inhaler Inhale 2 puffs into the lungs 2 (two) times daily. 08/10/16   Orlena Sheldon, PA-C  calcium gluconate 500 MG tablet Take 1 tablet (500 mg total) by mouth 3 (three) times daily. 08/23/14   Orlena Sheldon, PA-C  chlorpheniramine-HYDROcodone (TUSSIONEX PENNKINETIC ER) 10-8 MG/5ML SUER Take 5 mLs by mouth at bedtime as needed for cough. 12/24/17   Ok Edwards, PA-C  CVS D3 2000 units CAPS TAKE 2 CAPSULES BY MOUTH ONCE A DAY 06/10/17   Dena Billet B, PA-C  doxycycline (VIBRAMYCIN) 100 MG capsule Take 1 capsule (100 mg total) by mouth 2 (two) times daily. 12/24/17   Tasia Catchings, Amy V, PA-C  estradiol (ESTRACE) 0.5 MG tablet TAKE 1 TABLET BY MOUTH EVERY DAY 11/18/17   Alycia Rossetti, MD  hydrochlorothiazide (HYDRODIURIL) 25 MG tablet TAKE  1 TABLET BY MOUTH EVERY DAY 12/10/17   [name], MD  hydrochlorothiazide (HYDRODIURIL) 25 MG tablet TAKE 1 TABLET BY MOUTH EVERY DAY 12/12/17   [name], MD  levocetirizine (XYZAL) 5 MG tablet TAKE 1 TABLET (5 MG TOTAL) BY MOUTH EVERY EVENING. 05/15/17   [name], MD  omeprazole (PRILOSEC) 20 MG capsule TAKE 1 CAPSULE BY MOUTH EVERY DAY 10/16/17   Orlena Sheldon, PA-C  predniSONE (DELTASONE) 50 MG tablet Take 1 tablet (50 mg total) by mouth daily. 12/24/17   Ok Edwards, PA-C    Family History Family History  Problem Relation Age of Onset  . Cancer Mother        colon  . Cancer Sister        ovarian  . Cancer Brother        melanoma  . Cancer Brother 54        colon cancer    Social History Social History   Tobacco Use  . Smoking status: Former Smoker    Packs/day: 1.00    Types: Cigarettes    Start date: 01/29/1965    Last attempt to quit: 10/30/2010    Years since quitting: 7.1  . Smokeless tobacco: Never Used  Substance Use Topics  . Alcohol use: No    Alcohol/week: 0.0 standard drinks  . Drug use: No     Allergies   Prevalite [cholestyramine light] and Ace inhibitors   Review of Systems Review of Systems  Reason unable to perform ROS: See HPI as above.     Physical Exam Triage Vital Signs ED Triage Vitals  Enc Vitals Group     BP 12/24/17 1134 (!) 155/63     Pulse Rate 12/24/17 1134 74     Resp 12/24/17 1134 18     Temp 12/24/17 1134 98.2 F (36.8 C)     Temp Source 12/24/17 1134 Oral     SpO2 12/24/17 1134 96 %     Weight --      Height --      Head Circumference --      Peak Flow --      Pain Score 12/24/17 1136 8     Pain Loc --      Pain Edu? --      Excl. in [place]? --    No data found.  Updated Vital Signs BP (!) 155/63 (BP Location: Right Arm)   Pulse 74   Temp 98.2 F (36.8 C) (Oral)   Resp 18   SpO2 96%   Physical Exam  Constitutional: She is oriented to person, place, and time. She appears well-developed and well-nourished. No distress.  HENT:  Head: Normocephalic and atraumatic.  Right Ear: Tympanic membrane, external ear and ear canal normal. Tympanic membrane is not erythematous and not bulging.  Left Ear: Tympanic membrane, external ear and ear canal normal. Tympanic membrane is not erythematous and not bulging.  Nose: Nose normal. Right sinus exhibits no maxillary sinus tenderness and no frontal sinus tenderness. Left sinus exhibits no maxillary sinus tenderness and no frontal sinus tenderness.  Mouth/Throat: Uvula is midline, oropharynx is clear and moist and mucous membranes are normal.  Eyes: Pupils are equal, round, and reactive to light. Conjunctivae are normal.  Neck: Normal range  of motion. Neck supple.  Cardiovascular: Normal rate, regular rhythm and normal heart sounds. Exam reveals no gallop and no friction rub.  No murmur heard. Pulmonary/Chest: Effort normal and breath sounds normal. No stridor.  No respiratory distress. She has no decreased breath sounds. She has no wheezes. She has no rhonchi. She has no rales.  Lymphadenopathy:    She has no cervical adenopathy.  Neurological: She is alert and oriented to person, place, and time.  Skin: Skin is warm and dry.  Psychiatric: She has a normal mood and affect. Her behavior is normal. Judgment normal.     UC Treatments / Results  Labs (all labs ordered are listed, but only abnormal results are displayed) Labs Reviewed - No data to display  EKG None  Radiology No results found.  Procedures Procedures (including critical care time)  Medications Ordered in UC Medications - No data to display  Initial Impression / Assessment and Plan / UC Course  I have reviewed the triage vital signs and the nursing notes.  Pertinent labs & imaging results that were available during my care of the patient were reviewed by me and considered in my medical decision making (see chart for details).    Prednisone and doxycycline as directed.  Patient requesting Tussionex for cough.  Will provide short course of Tussionex, discussed monitoring for respiratory depression given age.  Patient has taken the past without problems.  Other symptomatic treatment discussed.  Push fluids.  Return precautions given.  Patient expresses understanding and agrees to plan.  Final Clinical Impressions(s) / UC Diagnoses   Final diagnoses:  Bronchitis    ED Prescriptions    Medication Sig Dispense Auth. Provider   doxycycline (VIBRAMYCIN) 100 MG capsule Take 1 capsule (100 mg total) by mouth 2 (two) times daily. 20 capsule Yu, Amy V, PA-C   predniSONE (DELTASONE) 50 MG tablet Take 1 tablet (50 mg total) by mouth daily. 5 tablet Yu, Amy V,  PA-C   chlorpheniramine-HYDROcodone (TUSSIONEX PENNKINETIC ER) 10-8 MG/5ML SUER Take 5 mLs by mouth at bedtime as needed for cough. 60 mL Cathlean Sauer V, PA-C     Controlled Substance Prescriptions [place] Controlled Substance Registry consulted? Yes, I have consulted the [place] Controlled Substances Registry for this patient, and feel the risk/benefit ratio today is favorable for proceeding with this prescription for a controlled substance.   Ok Edwards, PA-C 12/24/17 1436

## 2017-12-24 NOTE — ED Triage Notes (Signed)
Pt presents with persistent cough that has gone from brown to yellow.

## 2018-01-02 ENCOUNTER — Other Ambulatory Visit: Payer: Self-pay | Admitting: Family Medicine

## 2018-01-02 DIAGNOSIS — I10 Essential (primary) hypertension: Secondary | ICD-10-CM

## 2018-02-07 ENCOUNTER — Other Ambulatory Visit: Payer: Self-pay | Admitting: Family Medicine

## 2018-02-07 DIAGNOSIS — I10 Essential (primary) hypertension: Secondary | ICD-10-CM

## 2018-02-12 ENCOUNTER — Other Ambulatory Visit: Payer: Self-pay | Admitting: Family Medicine

## 2018-03-12 DIAGNOSIS — H40003 Preglaucoma, unspecified, bilateral: Secondary | ICD-10-CM | POA: Diagnosis not present

## 2018-03-15 ENCOUNTER — Other Ambulatory Visit: Payer: Self-pay | Admitting: Family Medicine

## 2018-03-15 DIAGNOSIS — I10 Essential (primary) hypertension: Secondary | ICD-10-CM

## 2018-03-17 ENCOUNTER — Other Ambulatory Visit: Payer: Self-pay | Admitting: Family Medicine

## 2018-04-03 DIAGNOSIS — H40023 Open angle with borderline findings, high risk, bilateral: Secondary | ICD-10-CM | POA: Diagnosis not present

## 2018-04-11 ENCOUNTER — Other Ambulatory Visit: Payer: Self-pay | Admitting: *Deleted

## 2018-04-11 DIAGNOSIS — K219 Gastro-esophageal reflux disease without esophagitis: Secondary | ICD-10-CM

## 2018-04-11 MED ORDER — OMEPRAZOLE 20 MG PO CPDR: 20.0000 mg | DELAYED_RELEASE_CAPSULE | Freq: Every day | ORAL | 3 refills | Status: DC

## 2018-04-16 ENCOUNTER — Ambulatory Visit (INDEPENDENT_AMBULATORY_CARE_PROVIDER_SITE_OTHER): Payer: PPO | Admitting: Orthopaedic Surgery

## 2018-05-15 ENCOUNTER — Other Ambulatory Visit: Payer: Self-pay | Admitting: Family Medicine

## 2018-05-15 NOTE — Telephone Encounter (Signed)
Medication filled x1 with no refills.   Requires office visit before any further refills can be given.   Letter sent.  

## 2018-06-10 DIAGNOSIS — H18413 Arcus senilis, bilateral: Secondary | ICD-10-CM | POA: Diagnosis not present

## 2018-06-10 DIAGNOSIS — H25013 Cortical age-related cataract, bilateral: Secondary | ICD-10-CM | POA: Diagnosis not present

## 2018-06-10 DIAGNOSIS — H2512 Age-related nuclear cataract, left eye: Secondary | ICD-10-CM | POA: Diagnosis not present

## 2018-06-10 DIAGNOSIS — H25043 Posterior subcapsular polar age-related cataract, bilateral: Secondary | ICD-10-CM | POA: Diagnosis not present

## 2018-06-10 DIAGNOSIS — H40013 Open angle with borderline findings, low risk, bilateral: Secondary | ICD-10-CM | POA: Diagnosis not present

## 2018-06-10 DIAGNOSIS — H2513 Age-related nuclear cataract, bilateral: Secondary | ICD-10-CM | POA: Diagnosis not present

## 2018-06-20 DIAGNOSIS — H5212 Myopia, left eye: Secondary | ICD-10-CM | POA: Diagnosis not present

## 2018-06-20 DIAGNOSIS — H2511 Age-related nuclear cataract, right eye: Secondary | ICD-10-CM | POA: Diagnosis not present

## 2018-06-20 DIAGNOSIS — H52222 Regular astigmatism, left eye: Secondary | ICD-10-CM | POA: Diagnosis not present

## 2018-06-20 DIAGNOSIS — Z961 Presence of intraocular lens: Secondary | ICD-10-CM | POA: Diagnosis not present

## 2018-06-20 DIAGNOSIS — H2512 Age-related nuclear cataract, left eye: Secondary | ICD-10-CM | POA: Diagnosis not present

## 2018-06-27 ENCOUNTER — Other Ambulatory Visit: Payer: Self-pay

## 2018-06-27 ENCOUNTER — Encounter: Payer: Self-pay | Admitting: Family Medicine

## 2018-06-27 ENCOUNTER — Ambulatory Visit (INDEPENDENT_AMBULATORY_CARE_PROVIDER_SITE_OTHER): Payer: PPO | Admitting: Family Medicine

## 2018-06-27 VITALS — BP 138/86 | HR 70 | Temp 98.8°F | Resp 18 | Ht 64.0 in | Wt 218.0 lb

## 2018-06-27 DIAGNOSIS — H6982 Other specified disorders of Eustachian tube, left ear: Secondary | ICD-10-CM

## 2018-06-27 DIAGNOSIS — H8111 Benign paroxysmal vertigo, right ear: Secondary | ICD-10-CM

## 2018-06-27 NOTE — Progress Notes (Signed)
Subjective:    Patient ID: Amanda Pope, female    DOB: Dec 29, 1945, 73 y.o.   MRN: [id number]  HPI Patient presents today reporting 2 to 3 months of problems with her left ear.  She states that her ear feels like there is pressure behind it.  If she pinches her nose and blows, she can feel a popping sensation in her left ear.  She denies any hearing loss.  She denies any tinnitus.  She does feel like there is fluid trapped in her ear.  However on examination today, there is no evidence of any middle ear effusion.  The tympanic membrane is completely clear with no erythema.  There is no cerumen impaction occluding the right auditory canal.  Patient also states that for the last 2 to 3 months whenever she looks up and tilts her head back, if she gets into a certain position, the room will start to spin.  The symptoms will last several seconds and then resolve spontaneously.  It tends to occur when she lays down at night.  The room spins so badly that she has to close her eyes and hold onto the mattress and the symptoms abate just a split second or 2.  She denies any headaches.  She denies any blurry vision.  She denies any diplopia.  She denies any nausea or vomiting.  She states that occurs once or twice a day and usually only when her head is tilted back or she lays down.  Otherwise it never happens. Patient has no nystagmus on ocular exam.  However she has a vigorous response to Dix-Hallpike maneuver when her head is turned to the right and she is laid down supine Past Medical History:  Diagnosis Date  . Arthritis    hip & back  . Asthma   . COPD (chronic obstructive pulmonary disease) ([facility])   . Cystocele   . Depression   . GERD (gastroesophageal reflux disease)   . History of Helicobacter pylori infection   . Hypertension   . Shortness of breath dyspnea   . Urticaria   . Vitamin D deficiency    Past Surgical History:  Procedure Laterality Date  . ABDOMINAL HYSTERECTOMY     And Removed 1  ovary  . APPENDECTOMY    . CHOLECYSTECTOMY  05/2007  . TONSILLECTOMY    . TOTAL HIP ARTHROPLASTY Right 06/09/2015  . TOTAL HIP ARTHROPLASTY Right 06/09/2015   Procedure: RIGHT TOTAL HIP ARTHROPLASTY ANTERIOR APPROACH;  Surgeon: [name], MD;  Location: [facility];  Service: Orthopedics;  Laterality: Right;  . TUBAL LIGATION     Current Outpatient Medications on File Prior to Visit  Medication Sig Dispense Refill  . albuterol (PROVENTIL HFA;VENTOLIN HFA) 108 (90 Base) MCG/ACT inhaler Inhale 2 puffs into the lungs every 6 (six) hours as needed for wheezing or shortness of breath. 1 Inhaler 3  . aspirin EC 325 MG tablet Take 1 tablet (325 mg total) by mouth 2 (two) times daily. 84 tablet 0  . budesonide-formoterol (SYMBICORT) 160-4.5 MCG/ACT inhaler Inhale 2 puffs into the lungs 2 (two) times daily. 10.2 Inhaler 3  . calcium gluconate 500 MG tablet Take 1 tablet (500 mg total) by mouth 3 (three) times daily. 30 tablet 11  . [employer] D3 2000 units CAPS TAKE 2 CAPSULES BY MOUTH ONCE A DAY 60 capsule 9  . estradiol (ESTRACE) 0.5 MG tablet TAKE 1 TABLET BY MOUTH EVERY DAY 90 tablet 0  . hydrochlorothiazide (HYDRODIURIL) 25 MG tablet  TAKE 1 TABLET BY MOUTH EVERY DAY 30 tablet 0  . levocetirizine (XYZAL) 5 MG tablet TAKE 1 TABLET BY MOUTH EVERY DAY IN THE EVENING 90 tablet 1  . omeprazole (PRILOSEC) 20 MG capsule Take 1 capsule (20 mg total) by mouth daily. 90 capsule 3   No current facility-administered medications on file prior to visit.    Allergies  Allergen Reactions  . Prevalite [Cholestyramine Light] Hives  . Ace Inhibitors Rash   Social History   Socioeconomic History  . Marital status: Married    Spouse name: Not on file  . Number of children: Not on file  . Years of education: Not on file  . Highest education level: Not on file  Occupational History  . Not on file  Social Needs  . Financial resource strain: Not on file  . Food insecurity:    Worry: Not on file    Inability: Not on  file  . Transportation needs:    Medical: Not on file    Non-medical: Not on file  Tobacco Use  . Smoking status: Former Smoker    Packs/day: 1.00    Types: Cigarettes    Start date: 01/29/1965    Last attempt to quit: 10/30/2010    Years since quitting: 7.6  . Smokeless tobacco: Never Used  Substance and Sexual Activity  . Alcohol use: No    Alcohol/week: 0.0 standard drinks  . Drug use: No  . Sexual activity: Not on file  Lifestyle  . Physical activity:    Days per week: Not on file    Minutes per session: Not on file  . Stress: Not on file  Relationships  . Social connections:    Talks on phone: Not on file    Gets together: Not on file    Attends religious service: Not on file    Active member of club or organization: Not on file    Attends meetings of clubs or organizations: Not on file    Relationship status: Not on file  . Intimate partner violence:    Fear of current or ex partner: Not on file    Emotionally abused: Not on file    Physically abused: Not on file    Forced sexual activity: Not on file  Other Topics Concern  . Not on file  Social History Narrative   Entered 07/2013:   Is retired. Keeps [facility]. Loves it.   Married.      Review of Systems  All other systems reviewed and are negative.      Objective:   Physical Exam Vitals signs reviewed.  Constitutional:      General: She is not in acute distress.    Appearance: She is not ill-appearing or toxic-appearing.  HENT:     Head: Normocephalic and atraumatic.     Right Ear: Tympanic membrane, ear canal and external ear normal.     Left Ear: Tympanic membrane, ear canal and external ear normal.     Nose: Nose normal. No congestion or rhinorrhea.  Eyes:     Extraocular Movements: Extraocular movements intact.     Conjunctiva/sclera: Conjunctivae normal.     Pupils: Pupils are equal, round, and reactive to light.  Cardiovascular:     Rate and Rhythm: Normal rate and regular rhythm.      Pulses: Normal pulses.     Heart sounds: Normal heart sounds.  Pulmonary:     Effort: Pulmonary effort is normal.     Breath  sounds: Normal breath sounds.  Lymphadenopathy:     Cervical: No cervical adenopathy.  Neurological:     General: No focal deficit present.     Mental Status: She is alert and oriented to person, place, and time.     Motor: No weakness.     Coordination: Coordination normal.     Gait: Gait normal.           Assessment & Plan:  BPPV (benign paroxysmal positional vertigo), right  Dysfunction of left eustachian tube  I believe the patient's symptoms are most likely vertigo secondary to benign paroxysmal positional vertigo coupled with eustachian tube in the left ear.  Less likely would be Mnire's disease.  I have offered the patient a referral to physical therapy for Epley maneuvers.  Patient declines that at the present time as the symptoms are mild and sporadic.  We will try Flonase 2 sprays each nostril daily and Claritin 10 mg daily to try to treat eustachian tube dysfunction.  If symptoms persist, consider ENT consultation to rule out atypical Mnire's disease given the pressure-like sensation in the left ear coupled with the vertigo as well as to evaluate for possible eustachian tube dysfunction.  However the patient denies any hearing loss or tinnitus.

## 2018-07-04 DIAGNOSIS — H25043 Posterior subcapsular polar age-related cataract, bilateral: Secondary | ICD-10-CM | POA: Diagnosis not present

## 2018-07-04 DIAGNOSIS — H40013 Open angle with borderline findings, low risk, bilateral: Secondary | ICD-10-CM | POA: Diagnosis not present

## 2018-07-04 DIAGNOSIS — H18413 Arcus senilis, bilateral: Secondary | ICD-10-CM | POA: Diagnosis not present

## 2018-07-04 DIAGNOSIS — H2512 Age-related nuclear cataract, left eye: Secondary | ICD-10-CM | POA: Diagnosis not present

## 2018-07-04 DIAGNOSIS — H25013 Cortical age-related cataract, bilateral: Secondary | ICD-10-CM | POA: Diagnosis not present

## 2018-07-04 DIAGNOSIS — H2513 Age-related nuclear cataract, bilateral: Secondary | ICD-10-CM | POA: Diagnosis not present

## 2018-07-04 DIAGNOSIS — H2511 Age-related nuclear cataract, right eye: Secondary | ICD-10-CM | POA: Diagnosis not present

## 2018-08-12 ENCOUNTER — Other Ambulatory Visit: Payer: Self-pay | Admitting: *Deleted

## 2018-08-12 MED ORDER — ESTRADIOL 0.5 MG PO TABS: 0.5000 mg | ORAL_TABLET | Freq: Every day | ORAL | 0 refills | Status: DC

## 2018-08-19 ENCOUNTER — Ambulatory Visit (INDEPENDENT_AMBULATORY_CARE_PROVIDER_SITE_OTHER): Payer: PPO

## 2018-08-19 ENCOUNTER — Ambulatory Visit (INDEPENDENT_AMBULATORY_CARE_PROVIDER_SITE_OTHER): Payer: PPO | Admitting: Orthopaedic Surgery

## 2018-08-19 ENCOUNTER — Encounter: Payer: Self-pay | Admitting: Orthopaedic Surgery

## 2018-08-19 DIAGNOSIS — M25552 Pain in left hip: Secondary | ICD-10-CM

## 2018-08-19 DIAGNOSIS — M5442 Lumbago with sciatica, left side: Secondary | ICD-10-CM

## 2018-08-19 DIAGNOSIS — G8929 Other chronic pain: Secondary | ICD-10-CM | POA: Insufficient documentation

## 2018-08-19 DIAGNOSIS — M25551 Pain in right hip: Secondary | ICD-10-CM

## 2018-08-19 MED ORDER — DIAZEPAM 5 MG PO TABS: 5.0000 mg | ORAL_TABLET | Freq: Four times a day (QID) | ORAL | 0 refills | Status: DC | PRN

## 2018-08-19 MED ORDER — PREDNISONE 10 MG (21) PO TBPK: ORAL_TABLET | ORAL | 0 refills | Status: DC

## 2018-08-19 NOTE — Progress Notes (Signed)
Office Visit Note   Patient: Amanda Pope           Date of Birth: January 22, 1946           MRN: [id number] Visit Date: 08/19/2018              Requested by: [name], MD [id number] [place],  [place] [address] PCP: [name], MD   Assessment & Plan: Visit Diagnoses:  1. Pain of both hip joints   2. Chronic left-sided low back pain with left-sided sciatica     Plan: Impression is lumbar degenerative disc disease at L5-S1 and lumbar spondylosis.  She also has posterior lateral right hip pain which is likely combination of abductor tendinosis and bursitis.  I did offer a cortisone injection which she declined as well as declined physical therapy.  I offered her a prednisone Dosepak which she agreed to.  With her bilateral hip pain and lumbar spine issues that she has been having for several years I feel that it is best to get an MRI of her lumbar spine to evaluate for structural abnormalities.  We will see her back after the MRI.  Follow-Up Instructions: Return if symptoms worsen or fail to improve.   Orders:  Orders Placed This Encounter  Procedures  . XR HIPS BILAT W OR W/O PELVIS 3-4 VIEWS  . XR Lumbar Spine 2-3 Views  . MR Lumbar Spine w/o contrast   Meds ordered this encounter  Medications  . predniSONE (STERAPRED UNI-PAK 21 TAB) 10 MG (21) TBPK tablet    Sig: Take as directed    Dispense:  21 tablet    Refill:  0  . diazepam (VALIUM) 5 MG tablet    Sig: Take 1-2 tablets (5-10 mg total) by mouth every 6 (six) hours as needed for muscle spasms.    Dispense:  60 tablet    Refill:  0      Procedures: No procedures performed   Clinical Data: No additional findings.   Subjective: Chief Complaint  Patient presents with  . Right Hip - Pain  . Left Hip - Pain  . Lower Back - Pain    Amanda Pope is a 73 year old female who is 3 years status post right total hip replacement who comes in for evaluation of low back pain and bilateral hip pain.  She  states that she denies any groin pain.  The pain on her right hip is more on the posterior lateral side that will sometimes cause her hip to catch and cause significant pain.  In terms of her left hip this hurts in the lower lumbar and buttock region.  She does state that it radiates down into her left leg.  Denies any bowel bladder dysfunction or constitutional symptoms or red flag symptoms.   Review of Systems  Constitutional: Negative.   HENT: Negative.   Eyes: Negative.   Respiratory: Negative.   Cardiovascular: Negative.   Endocrine: Negative.   Musculoskeletal: Negative.   Neurological: Negative.   Hematological: Negative.   Psychiatric/Behavioral: Negative.   All other systems reviewed and are negative.    Objective: Vital Signs: There were no vitals taken for this visit.  Physical Exam Vitals signs and nursing note reviewed.  Constitutional:      Appearance: She is well-developed.  HENT:     Head: Normocephalic and atraumatic.  Neck:     Musculoskeletal: Neck supple.  Pulmonary:     Effort: Pulmonary effort is normal.  Abdominal:     Palpations: Abdomen is soft.  Skin:    General: Skin is warm.     Capillary Refill: Capillary refill takes less than 2 seconds.  Neurological:     Mental Status: She is alert and oriented to person, place, and time.  Psychiatric:        Behavior: Behavior normal.        Thought Content: Thought content normal.        Judgment: Judgment normal.     Ortho Exam Right hip exam shows fully healed surgical scar.  She has no pain with rotation of the hip.  She has exquisite tenderness to the posterior lateral aspect of the greater trochanter. Left hip exam shows no pain with internal or external rotation.  Pain is limited to the lower lumbar spine with palpation.  No focal motor or sensory deficits. Specialty Comments:  No specialty comments available.  Imaging: Xr Hips Bilat W Or W/o Pelvis 3-4 Views  Result Date: 08/19/2018 Stable  right total hip replacement without complication.  Xr Lumbar Spine 2-3 Views  Result Date: 08/19/2018 Severe degenerative disc disease at L5-S1.  Slight spondylolisthesis at L4-L5.  Multilevel lumbar spondylosis.    PMFS History: Patient Active Problem List   Diagnosis Date Noted  . Pain of both hip joints 08/19/2018  . Chronic left-sided low back pain with left-sided sciatica 08/19/2018  . Osteoarthritis of right hip 06/09/2015  . Hip joint replacement status 06/09/2015  . Chronic urticaria 11/23/2014  . Other allergic rhinitis 11/23/2014  . Asthma with COPD 11/23/2014  . Hormone replacement therapy (postmenopausal) 08/27/2013  . Subclinical hypothyroidism 08/27/2013  . Hyperglycemia 08/27/2013  . Cystocele   . Hypertension   . Depression   . History of Helicobacter pylori infection   . Vitamin D deficiency   . COPD (chronic obstructive pulmonary disease) ([facility])    Past Medical History:  Diagnosis Date  . Arthritis    hip & back  . Asthma   . COPD (chronic obstructive pulmonary disease) ([facility])   . Cystocele   . Depression   . GERD (gastroesophageal reflux disease)   . History of Helicobacter pylori infection   . Hypertension   . Shortness of breath dyspnea   . Urticaria   . Vitamin D deficiency     Family History  Problem Relation Age of Onset  . Cancer Mother        colon  . Cancer Sister        ovarian  . Cancer Brother        melanoma  . Cancer Brother 79       colon cancer    Past Surgical History:  Procedure Laterality Date  . ABDOMINAL HYSTERECTOMY     And Removed 1 ovary  . APPENDECTOMY    . CHOLECYSTECTOMY  05/2007  . TONSILLECTOMY    . TOTAL HIP ARTHROPLASTY Right 06/09/2015  . TOTAL HIP ARTHROPLASTY Right 06/09/2015   Procedure: RIGHT TOTAL HIP ARTHROPLASTY ANTERIOR APPROACH;  Surgeon: [name], MD;  Location: [facility];  Service: Orthopedics;  Laterality: Right;  . TUBAL LIGATION     Social History   Occupational History  . Not on file   Tobacco Use  . Smoking status: Former Smoker    Packs/day: 1.00    Types: Cigarettes    Start date: 01/29/1965    Quit date: 10/30/2010    Years since quitting: 7.8  . Smokeless tobacco: Never Used  Substance and Sexual Activity  .  Alcohol use: No    Alcohol/week: 0.0 standard drinks  . Drug use: No  . Sexual activity: Not on file

## 2018-08-26 DIAGNOSIS — Z1211 Encounter for screening for malignant neoplasm of colon: Secondary | ICD-10-CM | POA: Diagnosis not present

## 2018-08-29 ENCOUNTER — Other Ambulatory Visit: Payer: Self-pay

## 2018-09-07 ENCOUNTER — Other Ambulatory Visit: Payer: Self-pay | Admitting: Family Medicine

## 2018-09-18 ENCOUNTER — Ambulatory Visit
Admission: RE | Admit: 2018-09-18 | Discharge: 2018-09-18 | Disposition: A | Discharge status: Home / Self Care | Payer: PPO | Source: Ambulatory Visit | Attending: Orthopaedic Surgery | Admitting: Orthopaedic Surgery

## 2018-09-18 DIAGNOSIS — G8929 Other chronic pain: Secondary | ICD-10-CM

## 2018-09-18 DIAGNOSIS — M5127 Other intervertebral disc displacement, lumbosacral region: Secondary | ICD-10-CM | POA: Diagnosis not present

## 2018-09-23 ENCOUNTER — Ambulatory Visit (INDEPENDENT_AMBULATORY_CARE_PROVIDER_SITE_OTHER): Payer: PPO | Admitting: Orthopaedic Surgery

## 2018-09-23 DIAGNOSIS — M545 Low back pain: Secondary | ICD-10-CM

## 2018-09-23 DIAGNOSIS — G8929 Other chronic pain: Secondary | ICD-10-CM

## 2018-09-23 NOTE — Progress Notes (Signed)
Office Visit Note   Patient: Amanda Pope           Date of Birth: [birth year]           MRN: [id number] Visit Date: 09/23/2018              Requested by: [name], MD [address] [facility] [place],  [place] [address] PCP: [name], MD   Assessment & Plan: Visit Diagnoses:  1. Chronic midline low back pain without sciatica     Plan: MRI of the lumbar spine shows multilevel mild degenerative disc bulging with resultant bilateral neural foraminal narrowing.  Overall findings are consistent with degenerative changes.  We did briefly discuss a lumbar spine ESI but she would like to just try ibuprofen for now as the pain seems to be well controlled by this.  She will certainly let us know if the pain becomes worse.  We will see her back as needed for this issue.  Follow-Up Instructions: Return if symptoms worsen or fail to improve.   Orders:  No orders of the defined types were placed in this encounter.  No orders of the defined types were placed in this encounter.     Procedures: No procedures performed   Clinical Data: No additional findings.   Subjective: Chief Complaint  Patient presents with  . Lower Back - Follow-up    MRI Results    Amanda Pope is here for MRI review of her lumbar spine.  She states that she has good days and bad days with her back.  Right now she has to take ibuprofen every 3 to 4 days.  Sounds like the pain is relatively well controlled.   Review of Systems  Constitutional: Negative.   HENT: Negative.   Eyes: Negative.   Respiratory: Negative.   Cardiovascular: Negative.   Endocrine: Negative.   Musculoskeletal: Negative.   Neurological: Negative.   Hematological: Negative.   Psychiatric/Behavioral: Negative.   All other systems reviewed and are negative.    Objective: Vital Signs: There were no vitals taken for this visit.  Physical Exam Vitals signs and nursing note reviewed.  Constitutional:      Appearance: She is  well-developed.  Pulmonary:     Effort: Pulmonary effort is normal.  Skin:    General: Skin is warm.     Capillary Refill: Capillary refill takes less than 2 seconds.  Neurological:     Mental Status: She is alert and oriented to person, place, and time.  Psychiatric:        Behavior: Behavior normal.        Thought Content: Thought content normal.        Judgment: Judgment normal.     Ortho Exam Exam is unchanged.  No focal deficits. Specialty Comments:  No specialty comments available.  Imaging: No results found.   PMFS History: Patient Active Problem List   Diagnosis Date Noted  . Pain of both hip joints 08/19/2018  . Chronic left-sided low back pain with left-sided sciatica 08/19/2018  . Osteoarthritis of right hip 06/09/2015  . Hip joint replacement status 06/09/2015  . Chronic urticaria 11/23/2014  . Other allergic rhinitis 11/23/2014  . Asthma with COPD 11/23/2014  . Hormone replacement therapy (postmenopausal) 08/27/2013  . Subclinical hypothyroidism 08/27/2013  . Hyperglycemia 08/27/2013  . Cystocele   . Hypertension   . Depression   . History of Helicobacter pylori infection   . Vitamin D deficiency   . COPD (chronic  obstructive pulmonary disease) ([facility])    Past Medical History:  Diagnosis Date  . Arthritis    hip & back  . Asthma   . COPD (chronic obstructive pulmonary disease) ([facility])   . Cystocele   . Depression   . GERD (gastroesophageal reflux disease)   . History of Helicobacter pylori infection   . Hypertension   . Shortness of breath dyspnea   . Urticaria   . Vitamin D deficiency     Family History  Problem Relation Age of Onset  . Cancer Mother        colon  . Cancer Sister        ovarian  . Cancer Brother        melanoma  . Cancer Brother 20       colon cancer    Past Surgical History:  Procedure Laterality Date  . ABDOMINAL HYSTERECTOMY     And Removed 1 ovary  . APPENDECTOMY    . CHOLECYSTECTOMY  05/2007  . TONSILLECTOMY     . TOTAL HIP ARTHROPLASTY Right 06/09/2015  . TOTAL HIP ARTHROPLASTY Right 06/09/2015   Procedure: RIGHT TOTAL HIP ARTHROPLASTY ANTERIOR APPROACH;  Surgeon: [name], MD;  Location: [facility];  Service: Orthopedics;  Laterality: Right;  . TUBAL LIGATION     Social History   Occupational History  . Not on file  Tobacco Use  . Smoking status: Former Smoker    Packs/day: 1.00    Types: Cigarettes    Start date: 01/29/1965    Quit date: 10/30/2010    Years since quitting: 7.9  . Smokeless tobacco: Never Used  Substance and Sexual Activity  . Alcohol use: No    Alcohol/week: 0.0 standard drinks  . Drug use: No  . Sexual activity: Not on file

## 2018-12-19 ENCOUNTER — Other Ambulatory Visit: Payer: Self-pay | Admitting: Family Medicine

## 2018-12-19 DIAGNOSIS — I10 Essential (primary) hypertension: Secondary | ICD-10-CM

## 2019-01-05 ENCOUNTER — Other Ambulatory Visit: Payer: Self-pay

## 2019-01-06 ENCOUNTER — Ambulatory Visit (INDEPENDENT_AMBULATORY_CARE_PROVIDER_SITE_OTHER): Payer: PPO | Admitting: Family Medicine

## 2019-01-06 ENCOUNTER — Encounter: Payer: Self-pay | Admitting: Family Medicine

## 2019-01-06 VITALS — BP 154/100 | HR 82 | Temp 96.8°F | Resp 16 | Ht 64.0 in | Wt 212.0 lb

## 2019-01-06 DIAGNOSIS — N200 Calculus of kidney: Secondary | ICD-10-CM

## 2019-01-06 DIAGNOSIS — M545 Low back pain, unspecified: Secondary | ICD-10-CM

## 2019-01-06 LAB — URINALYSIS, ROUTINE W REFLEX MICROSCOPIC
Bilirubin Urine: NEGATIVE
Glucose, UA: NEGATIVE
Hgb urine dipstick: NEGATIVE
Ketones, ur: NEGATIVE
Leukocytes,Ua: NEGATIVE
Nitrite: NEGATIVE
Specific Gravity, Urine: 1.025 (ref 1.001–1.03)
pH: 6 (ref 5.0–8.0)

## 2019-01-06 LAB — MICROSCOPIC MESSAGE

## 2019-01-06 MED ORDER — OXYCODONE-ACETAMINOPHEN 5-325 MG PO TABS: 1.0000 | ORAL_TABLET | ORAL | 0 refills | Status: DC | PRN

## 2019-01-06 MED ORDER — TAMSULOSIN HCL 0.4 MG PO CAPS: 0.4000 mg | ORAL_CAPSULE | Freq: Every day | ORAL | 0 refills | Status: DC

## 2019-01-06 NOTE — Progress Notes (Signed)
Subjective:    Patient ID: Amanda Pope, female    DOB: February 01, 1945, 73 y.o.   MRN: [id number]  HPI  Patient is being seen today for right flank and right lower quadrant abdominal pain.  She states that is been off and on for 2 weeks.  The pain initiates in her right flank and then radiates to around the right anterior superior iliac spine.  The pain is constant however has waves of intense pain.  Friday she woke up crying in intense pain but the pain gradually improved throughout the day.  Her ovaries have been removed.  She has a history of a cholecystectomy.  She is also had an appendectomy.  She states that over the last week she has been more constipated although she has a bowel movement every day is typically "rabbit pellets".  However there is no improvement of the pain with defecation.  She also has gross hematuria which is a chronic problem for her and has been worked up in the past.  However she has had episodes of dysuria over the last week as well.  At times the pain is 8 or 9 on a scale of 10 at other times is a 3 or 4. Past Medical History:  Diagnosis Date  . Arthritis    hip & back  . Asthma   . COPD (chronic obstructive pulmonary disease) ([place])   . Cystocele   . Depression   . GERD (gastroesophageal reflux disease)   . History of Helicobacter pylori infection   . Hypertension   . Shortness of breath dyspnea   . Urticaria   . Vitamin D deficiency    Past Surgical History:  Procedure Laterality Date  . ABDOMINAL HYSTERECTOMY     And Removed 1 ovary  . APPENDECTOMY    . CHOLECYSTECTOMY  05/2007  . TONSILLECTOMY    . TOTAL HIP ARTHROPLASTY Right 06/09/2015  . TOTAL HIP ARTHROPLASTY Right 06/09/2015   Procedure: RIGHT TOTAL HIP ARTHROPLASTY ANTERIOR APPROACH;  Surgeon: [name], MD;  Location: [place];  Service: Orthopedics;  Laterality: Right;  . TUBAL LIGATION     Current Outpatient Medications on File Prior to Visit  Medication Sig Dispense Refill  . albuterol  (PROVENTIL HFA;VENTOLIN HFA) 108 (90 Base) MCG/ACT inhaler Inhale 2 puffs into the lungs every 6 (six) hours as needed for wheezing or shortness of breath. 1 Inhaler 3  . aspirin EC 325 MG tablet Take 1 tablet (325 mg total) by mouth 2 (two) times daily. 84 tablet 0  . budesonide-formoterol (SYMBICORT) 160-4.5 MCG/ACT inhaler Inhale 2 puffs into the lungs 2 (two) times daily. 10.2 Inhaler 3  . calcium gluconate 500 MG tablet Take 1 tablet (500 mg total) by mouth 3 (three) times daily. 30 tablet 11  . [facility] D3 2000 units CAPS TAKE 2 CAPSULES BY MOUTH ONCE A DAY 60 capsule 9  . diazepam (VALIUM) 5 MG tablet Take 1-2 tablets (5-10 mg total) by mouth every 6 (six) hours as needed for muscle spasms. 60 tablet 0  . estradiol (ESTRACE) 0.5 MG tablet Take 1 tablet (0.5 mg total) by mouth daily. 90 tablet 0  . hydrochlorothiazide (HYDRODIURIL) 25 MG tablet TAKE 1 TABLET BY MOUTH EVERY DAY 30 tablet 0  . levocetirizine (XYZAL) 5 MG tablet TAKE 1 TABLET BY MOUTH EVERY DAY IN THE EVENING 90 tablet 1  . omeprazole (PRILOSEC) 20 MG capsule Take 1 capsule (20 mg total) by mouth daily. 90 capsule 3   No  current facility-administered medications on file prior to visit.    Allergies  Allergen Reactions  . Prevalite [Cholestyramine Light] Hives  . Ace Inhibitors Rash   Social History   Socioeconomic History  . Marital status: Married    Spouse name: Not on file  . Number of children: Not on file  . Years of education: Not on file  . Highest education level: Not on file  Occupational History  . Not on file  Social Needs  . Financial resource strain: Not on file  . Food insecurity    Worry: Not on file    Inability: Not on file  . Transportation needs    Medical: Not on file    Non-medical: Not on file  Tobacco Use  . Smoking status: Former Smoker    Packs/day: 1.00    Types: Cigarettes    Start date: 01/29/1965    Quit date: 10/30/2010    Years since quitting: 8.1  . Smokeless tobacco: Never Used   Substance and Sexual Activity  . Alcohol use: No    Alcohol/week: 0.0 standard drinks  . Drug use: No  . Sexual activity: Not on file  Lifestyle  . Physical activity    Days per week: Not on file    Minutes per session: Not on file  . Stress: Not on file  Relationships  . Social Herbalist on phone: Not on file    Gets together: Not on file    Attends religious service: Not on file    Active member of club or organization: Not on file    Attends meetings of clubs or organizations: Not on file    Relationship status: Not on file  . Intimate partner violence    Fear of current or ex partner: Not on file    Emotionally abused: Not on file    Physically abused: Not on file    Forced sexual activity: Not on file  Other Topics Concern  . Not on file  Social History Narrative   Entered 07/2013:   Is retired. Keeps [facility]. Loves it.   Married.     Review of Systems  All other systems reviewed and are negative.      Objective:   Physical Exam Constitutional:      General: She is not in acute distress.    Appearance: Normal appearance. She is not ill-appearing, toxic-appearing or diaphoretic.  Cardiovascular:     Rate and Rhythm: Normal rate and regular rhythm.     Heart sounds: Normal heart sounds. No murmur.  Pulmonary:     Effort: Pulmonary effort is normal. No respiratory distress.     Breath sounds: Normal breath sounds. No stridor. No wheezing, rhonchi or rales.  Chest:     Chest wall: No tenderness.  Abdominal:     General: Abdomen is flat. Bowel sounds are normal. There is no distension.     Palpations: Abdomen is soft.     Tenderness: There is no abdominal tenderness. There is no right CVA tenderness or guarding.    Neurological:     Mental Status: She is alert.           Assessment & Plan:  Right-sided low back pain without sciatica, unspecified chronicity - Plan: Urinalysis, Routine w reflex microscopic  Right nephrolithiasis -  Plan: tamsulosin (FLOMAX) 0.4 MG CAPS capsule, oxyCODONE-acetaminophen (PERCOCET) 5-325 MG tablet, CT RENAL STONE STUDY  I suspect that the patient is experiencing nephrolithiasis.  Begin  Flomax 0.4 mg p.o. daily and add oxycodone 5/325 1 to 2 tablets every 4-6 hours as needed for pain.  Obtain CT scan renal stone protocol to confirm diagnosis and also to confirm the size of the stone given the fact the pain has been present for 2 weeks as a suspect that the stone may be greater than 5 mm and may not be passing.  If the patient develops intense pain or symptoms change in any way I have  directed her to go to the emergency room for immediate imaging

## 2019-01-12 ENCOUNTER — Other Ambulatory Visit: Payer: Self-pay | Admitting: Family Medicine

## 2019-01-12 DIAGNOSIS — I10 Essential (primary) hypertension: Secondary | ICD-10-CM

## 2019-01-13 ENCOUNTER — Other Ambulatory Visit: Payer: PPO

## 2019-01-13 DIAGNOSIS — E038 Other specified hypothyroidism: Secondary | ICD-10-CM

## 2019-01-13 DIAGNOSIS — E039 Hypothyroidism, unspecified: Secondary | ICD-10-CM | POA: Diagnosis not present

## 2019-01-13 DIAGNOSIS — I10 Essential (primary) hypertension: Secondary | ICD-10-CM | POA: Diagnosis not present

## 2019-01-13 DIAGNOSIS — E785 Hyperlipidemia, unspecified: Secondary | ICD-10-CM

## 2019-01-13 DIAGNOSIS — R7303 Prediabetes: Secondary | ICD-10-CM

## 2019-01-13 DIAGNOSIS — J441 Chronic obstructive pulmonary disease with (acute) exacerbation: Secondary | ICD-10-CM

## 2019-01-14 ENCOUNTER — Ambulatory Visit
Admission: RE | Admit: 2019-01-14 | Discharge: 2019-01-14 | Disposition: A | Discharge status: Home / Self Care | Payer: PPO | Source: Ambulatory Visit | Attending: Family Medicine | Admitting: Family Medicine

## 2019-01-14 DIAGNOSIS — R319 Hematuria, unspecified: Secondary | ICD-10-CM | POA: Diagnosis not present

## 2019-01-14 DIAGNOSIS — N200 Calculus of kidney: Secondary | ICD-10-CM

## 2019-01-14 DIAGNOSIS — K5730 Diverticulosis of large intestine without perforation or abscess without bleeding: Secondary | ICD-10-CM | POA: Diagnosis not present

## 2019-01-14 LAB — COMPREHENSIVE METABOLIC PANEL
AG Ratio: 1.4 (calc) (ref 1.0–2.5)
ALT: 36 U/L — ABNORMAL HIGH (ref 6–29)
AST: 39 U/L — ABNORMAL HIGH (ref 10–35)
Albumin: 4 g/dL (ref 3.6–5.1)
Alkaline phosphatase (APISO): 103 U/L (ref 37–153)
BUN: 11 mg/dL (ref 7–25)
CO2: 30 mmol/L (ref 20–32)
Calcium: 9.8 mg/dL (ref 8.6–10.4)
Chloride: 99 mmol/L (ref 98–110)
Creat: 0.66 mg/dL (ref 0.60–0.93)
Globulin: 2.9 g/dL (calc) (ref 1.9–3.7)
Glucose, Bld: 164 mg/dL — ABNORMAL HIGH (ref 65–99)
Potassium: 3.8 mmol/L (ref 3.5–5.3)
Sodium: 141 mmol/L (ref 135–146)
Total Bilirubin: 1.2 mg/dL (ref 0.2–1.2)
Total Protein: 6.9 g/dL (ref 6.1–8.1)

## 2019-01-14 LAB — LIPID PANEL
Cholesterol: 166 mg/dL (ref <200)
HDL: 40 mg/dL — ABNORMAL LOW (ref >=50)
LDL Cholesterol (Calc): 102 mg/dL (calc) — ABNORMAL HIGH
Non-HDL Cholesterol (Calc): 126 mg/dL (calc) (ref <130)
Total CHOL/HDL Ratio: 4.2 (calc) (ref <5.0)
Triglycerides: 137 mg/dL (ref <150)

## 2019-01-14 LAB — CBC WITH DIFFERENTIAL/PLATELET
Absolute Monocytes: 736 cells/uL (ref 200–950)
Basophils Absolute: 115 cells/uL (ref 0–200)
Basophils Relative: 1 %
Eosinophils Absolute: 3048 cells/uL — ABNORMAL HIGH (ref 15–500)
Eosinophils Relative: 26.5 %
HCT: 43.7 % (ref 35.0–45.0)
Hemoglobin: 14.5 g/dL (ref 11.7–15.5)
Lymphs Abs: 3082 cells/uL (ref 850–3900)
MCH: 29.2 pg (ref 27.0–33.0)
MCHC: 33.2 g/dL (ref 32.0–36.0)
MCV: 88.1 fL (ref 80.0–100.0)
MPV: 10.3 fL (ref 7.5–12.5)
Monocytes Relative: 6.4 %
Neutro Abs: 4520 cells/uL (ref 1500–7800)
Neutrophils Relative %: 39.3 %
Platelets: 251 10*3/uL (ref 140–400)
RBC: 4.96 10*6/uL (ref 3.80–5.10)
RDW: 12.9 % (ref 11.0–15.0)
Total Lymphocyte: 26.8 %
WBC: 11.5 10*3/uL — ABNORMAL HIGH (ref 3.8–10.8)

## 2019-01-14 LAB — HEMOGLOBIN A1C
Hgb A1c MFr Bld: 7.6 % of total Hgb — ABNORMAL HIGH (ref <5.7)
Mean Plasma Glucose: 171 (calc)
eAG (mmol/L): 9.5 (calc)

## 2019-01-14 LAB — TSH: TSH: 7.45 mIU/L — ABNORMAL HIGH (ref 0.40–4.50)

## 2019-01-20 ENCOUNTER — Ambulatory Visit (INDEPENDENT_AMBULATORY_CARE_PROVIDER_SITE_OTHER): Payer: PPO | Admitting: Family Medicine

## 2019-01-20 ENCOUNTER — Encounter: Payer: Self-pay | Admitting: Family Medicine

## 2019-01-20 ENCOUNTER — Other Ambulatory Visit: Payer: Self-pay

## 2019-01-20 VITALS — BP 150/86 | HR 82 | Temp 96.2°F | Resp 16 | Ht 64.0 in | Wt 214.0 lb

## 2019-01-20 DIAGNOSIS — N200 Calculus of kidney: Secondary | ICD-10-CM

## 2019-01-20 DIAGNOSIS — R7303 Prediabetes: Secondary | ICD-10-CM | POA: Diagnosis not present

## 2019-01-20 DIAGNOSIS — E038 Other specified hypothyroidism: Secondary | ICD-10-CM

## 2019-01-20 DIAGNOSIS — E039 Hypothyroidism, unspecified: Secondary | ICD-10-CM | POA: Diagnosis not present

## 2019-01-20 DIAGNOSIS — R945 Abnormal results of liver function studies: Secondary | ICD-10-CM | POA: Diagnosis not present

## 2019-01-20 NOTE — Progress Notes (Signed)
Subjective:    Patient ID: Amanda Pope, female    DOB: 02-02-1945, 73 y.o.   MRN: [id number]  HPI 01/06/19 Patient is being seen today for right flank and right lower quadrant abdominal pain.  She states that is been off and on for 2 weeks.  The pain initiates in her right flank and then radiates to around the right anterior superior iliac spine.  The pain is constant however has waves of intense pain.  Friday she woke up crying in intense pain but the pain gradually improved throughout the day.  Her ovaries have been removed.  She has a history of a cholecystectomy.  She is also had an appendectomy.  She states that over the last week she has been more constipated although she has a bowel movement every day is typically "rabbit pellets".  However there is no improvement of the pain with defecation.  She also has gross hematuria which is a chronic problem for her and has been worked up in the past.  However she has had episodes of dysuria over the last week as well.  At times the pain is 8 or 9 on a scale of 10 at other times is a 3 or 4.  At that time, my plan was: I suspect that the patient is experiencing nephrolithiasis.  Begin Flomax 0.4 mg p.o. daily and add oxycodone 5/325 1 to 2 tablets every 4-6 hours as needed for pain.  Obtain CT scan renal stone protocol to confirm diagnosis and also to confirm the size of the stone given the fact the pain has been present for 2 weeks as a suspect that the stone may be greater than 5 mm and may not be passing.  If the patient develops intense pain or symptoms change in any way I have  directed her to go to the emergency room for immediate imaging  CT IMPRESSION: 1. No definite explanation for patient's right-sided flank pain. Specifically, no evidence of nephrolithiasis or urinary obstruction. 2. Scattered colonic diverticulosis without evidence of superimposed acute diverticulitis on this noncontrast examination. 3. Post appendectomy and  cholecystectomy. 4. Suspected hepatic steatosis.  Correlation with LFTs is advised. 5.  Aortic Atherosclerosis (ICD10-I70.0).  01/20/19 As shown above, CT scan did not show any nephrolithiasis.  However patient states that 2 days prior to getting the CAT scan performed, the pain suddenly improved.  Since that time she has been pain-free leading me to suspect that the kidney stone may have passed.  At the present time she denies any right flank pain.  She denies any right-sided lower back pain.  She denies any abdominal pain.  Her abdomen is soft, nondistended, nontender.  She has no CVA tenderness.  There is no visible rash.  She is completely asymptomatic.  However her test showed a mild elevation in her liver function test.  Her fasting blood sugar was over 160 and her TSH was over 7.  She is here today to follow-up the lab work as well  Past Medical History:  Diagnosis Date  . Arthritis    hip & back  . Asthma   . COPD (chronic obstructive pulmonary disease) ([place])   . Cystocele   . Depression   . GERD (gastroesophageal reflux disease)   . History of Helicobacter pylori infection   . Hypertension   . Shortness of breath dyspnea   . Urticaria   . Vitamin D deficiency    Past Surgical History:  Procedure Laterality Date  . ABDOMINAL  HYSTERECTOMY     And Removed 1 ovary  . APPENDECTOMY    . CHOLECYSTECTOMY  05/2007  . TONSILLECTOMY    . TOTAL HIP ARTHROPLASTY Right 06/09/2015  . TOTAL HIP ARTHROPLASTY Right 06/09/2015   Procedure: RIGHT TOTAL HIP ARTHROPLASTY ANTERIOR APPROACH;  Surgeon: [name], MD;  Location: [place];  Service: Orthopedics;  Laterality: Right;  . TUBAL LIGATION     Current Outpatient Medications on File Prior to Visit  Medication Sig Dispense Refill  . albuterol (PROVENTIL HFA;VENTOLIN HFA) 108 (90 Base) MCG/ACT inhaler Inhale 2 puffs into the lungs every 6 (six) hours as needed for wheezing or shortness of breath. 1 Inhaler 3  . aspirin EC 325 MG tablet Take  1 tablet (325 mg total) by mouth 2 (two) times daily. 84 tablet 0  . budesonide-formoterol (SYMBICORT) 160-4.5 MCG/ACT inhaler Inhale 2 puffs into the lungs 2 (two) times daily. 10.2 Inhaler 3  . calcium gluconate 500 MG tablet Take 1 tablet (500 mg total) by mouth 3 (three) times daily. 30 tablet 11  . [facility]S D3 2000 units CAPS TAKE 2 CAPSULES BY MOUTH ONCE A DAY 60 capsule 9  . diazepam (VALIUM) 5 MG tablet Take 1-2 tablets (5-10 mg total) by mouth every 6 (six) hours as needed for muscle spasms. 60 tablet 0  . estradiol (ESTRACE) 0.5 MG tablet Take 1 tablet (0.5 mg total) by mouth daily. 90 tablet 0  . hydrochlorothiazide (HYDRODIURIL) 25 MG tablet TAKE 1 TABLET BY MOUTH EVERY DAY 30 tablet 0  . levocetirizine (XYZAL) 5 MG tablet TAKE 1 TABLET BY MOUTH EVERY DAY IN THE EVENING 90 tablet 1  . omeprazole (PRILOSEC) 20 MG capsule Take 1 capsule (20 mg total) by mouth daily. 90 capsule 3  . oxyCODONE-acetaminophen (PERCOCET) 5-325 MG tablet Take 1-2 tablets by mouth every 4 (four) hours as needed for severe pain. 30 tablet 0  . tamsulosin (FLOMAX) 0.4 MG CAPS capsule Take 1 capsule (0.4 mg total) by mouth daily. 30 capsule 0   No current facility-administered medications on file prior to visit.   Allergies  Allergen Reactions  . Prevalite [Cholestyramine Light] Hives  . Ace Inhibitors Rash   Social History   Socioeconomic History  . Marital status: Married    Spouse name: Not on file  . Number of children: Not on file  . Years of education: Not on file  . Highest education level: Not on file  Occupational History  . Not on file  Tobacco Use  . Smoking status: Former Smoker    Packs/day: 1.00    Types: Cigarettes    Start date: 01/29/1965    Quit date: 10/30/2010    Years since quitting: 8.2  . Smokeless tobacco: Never Used  Substance and Sexual Activity  . Alcohol use: No    Alcohol/week: 0.0 standard drinks  . Drug use: No  . Sexual activity: Not on file  Other Topics Concern  .  Not on file  Social History Narrative   Entered 07/2013:   Is retired. 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   Married.   Social Determinants of Health   Financial Resource Strain:   . Difficulty of Paying Living Expenses: Not on file  Food Insecurity:   . Worried About Charity fundraiser in the Last Year: Not on file  . Ran Out of Food in the Last Year: Not on file  Transportation Needs:   . Lack of Transportation (Medical): Not on file  . Lack  of Transportation (Non-Medical): Not on file  Physical Activity:   . Days of Exercise per Week: Not on file  . Minutes of Exercise per Session: Not on file  Stress:   . Feeling of Stress : Not on file  Social Connections:   . Frequency of Communication with Friends and Family: Not on file  . Frequency of Social Gatherings with Friends and Family: Not on file  . Attends Religious Services: Not on file  . Active Member of Clubs or Organizations: Not on file  . Attends Archivist Meetings: Not on file  . Marital Status: Not on file  Intimate Partner Violence:   . Fear of Current or Ex-Partner: Not on file  . Emotionally Abused: Not on file  . Physically Abused: Not on file  . Sexually Abused: Not on file     Review of Systems  All other systems reviewed and are negative.      Objective:   Physical Exam Constitutional:      General: She is not in acute distress.    Appearance: Normal appearance. She is not ill-appearing, toxic-appearing or diaphoretic.  Cardiovascular:     Rate and Rhythm: Normal rate and regular rhythm.     Heart sounds: Normal heart sounds. No murmur.  Pulmonary:     Effort: Pulmonary effort is normal. No respiratory distress.     Breath sounds: Normal breath sounds. No stridor. No wheezing, rhonchi or rales.  Chest:     Chest wall: No tenderness.  Abdominal:     General: Abdomen is flat. Bowel sounds are normal. There is no distension.     Palpations: Abdomen is soft.     Tenderness: There is no  abdominal tenderness. There is no right CVA tenderness or guarding.  Neurological:     Mental Status: She is alert.           Assessment & Plan:  Subclinical hypothyroidism - Plan: T4, Free  Prediabetes - Plan: Hemoglobin A1c, COMPLETE METABOLIC PANEL WITH GFR  Right nephrolithiasis  Patient has subclinical hypothyroidism, check a free T4 level.  If borderline low the patient may benefit from starting low-dose levothyroxine she does report fatigue and cold intolerance.  Patient has a history of mild prediabetes however her most recent fasting blood sugar was over 160.  I will check a hemoglobin A1c to verify but it appears the patient may have developed type 2 diabetes.  Await the results of A1c to determine if medication is necessary.  I still believe her symptoms are due to nephrolithiasis however she is now asymptomatic and I believe the patient most likely passed her kidney stone.  No further follow-up is necessary for this.  She had transient elevations in her liver function test.  I will repeat a CMP today to monitor this however these were very mild and likely clinically insignificant.

## 2019-01-21 ENCOUNTER — Other Ambulatory Visit: Payer: Self-pay | Admitting: Family Medicine

## 2019-01-21 MED ORDER — METFORMIN HCL 500 MG PO TABS: 1000.0000 mg | ORAL_TABLET | Freq: Two times a day (BID) | ORAL | 0 refills | Status: DC

## 2019-01-22 LAB — TEST AUTHORIZATION

## 2019-01-22 LAB — COMPLETE METABOLIC PANEL WITH GFR
AG Ratio: 1.5 (calc) (ref 1.0–2.5)
ALT: 50 U/L — ABNORMAL HIGH (ref 6–29)
AST: 60 U/L — ABNORMAL HIGH (ref 10–35)
Albumin: 4.3 g/dL (ref 3.6–5.1)
Alkaline phosphatase (APISO): 105 U/L (ref 37–153)
BUN: 14 mg/dL (ref 7–25)
CO2: 29 mmol/L (ref 20–32)
Calcium: 10 mg/dL (ref 8.6–10.4)
Chloride: 100 mmol/L (ref 98–110)
Creat: 0.71 mg/dL (ref 0.60–0.93)
GFR, Est African American: 99 mL/min/{1.73_m2} (ref >=60)
GFR, Est Non African American: 86 mL/min/{1.73_m2} (ref >=60)
Globulin: 2.9 g/dL (calc) (ref 1.9–3.7)
Glucose, Bld: 173 mg/dL — ABNORMAL HIGH (ref 65–99)
Potassium: 4.5 mmol/L (ref 3.5–5.3)
Sodium: 140 mmol/L (ref 135–146)
Total Bilirubin: 1 mg/dL (ref 0.2–1.2)
Total Protein: 7.2 g/dL (ref 6.1–8.1)

## 2019-01-22 LAB — HEPATITIS PANEL, ACUTE
Hep A IgM: NONREACTIVE
Hep B C IgM: NONREACTIVE
Hepatitis B Surface Ag: NONREACTIVE
Hepatitis C Ab: NONREACTIVE
SIGNAL TO CUT-OFF: 0.03 (ref <1.00)

## 2019-01-22 LAB — T4, FREE: Free T4: 1.2 ng/dL (ref 0.8–1.8)

## 2019-01-22 LAB — HEMOGLOBIN A1C
Hgb A1c MFr Bld: 7.6 % of total Hgb — ABNORMAL HIGH (ref <5.7)
Mean Plasma Glucose: 171 (calc)
eAG (mmol/L): 9.5 (calc)

## 2019-01-26 ENCOUNTER — Other Ambulatory Visit: Payer: Self-pay | Admitting: *Deleted

## 2019-01-26 ENCOUNTER — Other Ambulatory Visit: Payer: Self-pay | Admitting: Family Medicine

## 2019-01-26 DIAGNOSIS — R748 Abnormal levels of other serum enzymes: Secondary | ICD-10-CM

## 2019-01-26 MED ORDER — BLOOD GLUCOSE METER KIT: PACK | 0 refills | Status: DC

## 2019-01-26 MED ORDER — METFORMIN HCL 1000 MG PO TABS: 1000.0000 mg | ORAL_TABLET | Freq: Two times a day (BID) | ORAL | 3 refills | Status: DC

## 2019-01-26 MED ORDER — CONTINUOUS BLOOD GLUC RECEIVER DEVI: 1.0000 | Freq: Every day | 1 refills | Status: DC

## 2019-01-26 MED ORDER — CONTINUOUS BLOOD GLUC TRANSMIT MISC: 1.0000 | Freq: Every day | 11 refills | Status: DC

## 2019-01-26 NOTE — Telephone Encounter (Signed)
Received call from patient.   States that she has concerns about MTF dose.  Per lab results: Patient has developed diabetes. Recommend starting the patient on Metformin 500 mg twice daily. Gradually increase the medication over a period of a few weeks of 2000 mg twice daily as this can cause diarrhea.  Requested clarification from MD. Was advised that 1000mg  PO BID is max dose. Would like to taper to max dose to decrease SE.   Patient made aware. Verbalized understanding.   Also requested prescription for continuous monitoring glucose sensor. Order sent to pharmacy. Advised to contact insurance to determine if covered

## 2019-01-28 ENCOUNTER — Other Ambulatory Visit: Payer: Self-pay | Admitting: Family Medicine

## 2019-01-28 DIAGNOSIS — N200 Calculus of kidney: Secondary | ICD-10-CM

## 2019-01-29 ENCOUNTER — Other Ambulatory Visit: Payer: Self-pay | Admitting: *Deleted

## 2019-01-29 MED ORDER — CONTINUOUS BLOOD GLUC RECEIVER DEVI: 1.0000 | Freq: Every day | 1 refills | Status: DC

## 2019-02-05 ENCOUNTER — Other Ambulatory Visit: Payer: Self-pay | Admitting: Family Medicine

## 2019-02-05 DIAGNOSIS — I10 Essential (primary) hypertension: Secondary | ICD-10-CM

## 2019-02-09 ENCOUNTER — Other Ambulatory Visit: Payer: Self-pay | Admitting: Family Medicine

## 2019-02-09 MED ORDER — ACCU-CHEK AVIVA PLUS VI STRP: ORAL_STRIP | 3 refills | Status: DC

## 2019-02-09 MED ORDER — ACCU-CHEK AVIVA PLUS W/DEVICE KIT: PACK | 1 refills | Status: DC

## 2019-02-09 MED ORDER — ACCU-CHEK MULTICLIX LANCETS MISC: 3 refills | Status: DC

## 2019-02-10 ENCOUNTER — Ambulatory Visit
Admission: RE | Admit: 2019-02-10 | Discharge: 2019-02-10 | Disposition: A | Discharge status: Home / Self Care | Payer: PPO | Source: Ambulatory Visit | Attending: Family Medicine | Admitting: Family Medicine

## 2019-02-10 DIAGNOSIS — R748 Abnormal levels of other serum enzymes: Secondary | ICD-10-CM

## 2019-02-10 DIAGNOSIS — K7689 Other specified diseases of liver: Secondary | ICD-10-CM | POA: Diagnosis not present

## 2019-02-11 ENCOUNTER — Other Ambulatory Visit: Payer: Self-pay | Admitting: Family Medicine

## 2019-02-11 MED ORDER — ONETOUCH ULTRASOFT LANCETS MISC: 12 refills | Status: DC

## 2019-02-11 MED ORDER — ONETOUCH ULTRA 2 W/DEVICE KIT: PACK | 0 refills | Status: DC

## 2019-02-11 MED ORDER — ONETOUCH ULTRA VI STRP: ORAL_STRIP | 12 refills | Status: DC

## 2019-02-16 ENCOUNTER — Other Ambulatory Visit: Payer: Self-pay | Admitting: Family Medicine

## 2019-02-18 ENCOUNTER — Encounter: Payer: Self-pay | Admitting: Family Medicine

## 2019-02-24 ENCOUNTER — Other Ambulatory Visit: Payer: Self-pay | Admitting: Family Medicine

## 2019-02-24 DIAGNOSIS — N200 Calculus of kidney: Secondary | ICD-10-CM

## 2019-03-13 ENCOUNTER — Ambulatory Visit: Payer: PPO | Admitting: Family Medicine

## 2019-04-27 ENCOUNTER — Other Ambulatory Visit: Payer: Self-pay | Admitting: Family Medicine

## 2019-05-04 ENCOUNTER — Other Ambulatory Visit: Payer: Self-pay | Admitting: Family Medicine

## 2019-05-04 DIAGNOSIS — K219 Gastro-esophageal reflux disease without esophagitis: Secondary | ICD-10-CM

## 2019-05-04 MED ORDER — ACCU-CHEK AVIVA PLUS W/DEVICE KIT: PACK | 1 refills | Status: DC

## 2019-05-04 MED ORDER — ACCU-CHEK AVIVA PLUS VI STRP: ORAL_STRIP | 12 refills | Status: DC

## 2019-05-04 MED ORDER — ACCU-CHEK FASTCLIX LANCET KIT: PACK | 1 refills | Status: AC

## 2019-05-04 MED ORDER — ACCU-CHEK FASTCLIX LANCETS MISC: 3 refills | Status: DC

## 2019-05-14 DIAGNOSIS — D1801 Hemangioma of skin and subcutaneous tissue: Secondary | ICD-10-CM | POA: Diagnosis not present

## 2019-05-14 DIAGNOSIS — L821 Other seborrheic keratosis: Secondary | ICD-10-CM | POA: Diagnosis not present

## 2019-05-14 DIAGNOSIS — L820 Inflamed seborrheic keratosis: Secondary | ICD-10-CM | POA: Diagnosis not present

## 2019-05-14 DIAGNOSIS — D485 Neoplasm of uncertain behavior of skin: Secondary | ICD-10-CM | POA: Diagnosis not present

## 2019-05-14 DIAGNOSIS — D229 Melanocytic nevi, unspecified: Secondary | ICD-10-CM | POA: Diagnosis not present

## 2019-05-14 DIAGNOSIS — L814 Other melanin hyperpigmentation: Secondary | ICD-10-CM | POA: Diagnosis not present

## 2019-05-28 ENCOUNTER — Ambulatory Visit: Payer: Medicare HMO | Admitting: Family Medicine

## 2019-05-29 ENCOUNTER — Encounter: Payer: Self-pay | Admitting: Family Medicine

## 2019-05-29 ENCOUNTER — Ambulatory Visit (INDEPENDENT_AMBULATORY_CARE_PROVIDER_SITE_OTHER): Payer: Medicare HMO | Admitting: Family Medicine

## 2019-05-29 ENCOUNTER — Other Ambulatory Visit: Payer: Self-pay

## 2019-05-29 VITALS — BP 170/90 | HR 78 | Temp 97.4°F | Resp 16 | Ht 64.0 in | Wt 199.0 lb

## 2019-05-29 DIAGNOSIS — R748 Abnormal levels of other serum enzymes: Secondary | ICD-10-CM | POA: Diagnosis not present

## 2019-05-29 DIAGNOSIS — Z136 Encounter for screening for cardiovascular disorders: Secondary | ICD-10-CM | POA: Diagnosis not present

## 2019-05-29 DIAGNOSIS — R7303 Prediabetes: Secondary | ICD-10-CM | POA: Diagnosis not present

## 2019-05-29 DIAGNOSIS — E118 Type 2 diabetes mellitus with unspecified complications: Secondary | ICD-10-CM

## 2019-05-29 DIAGNOSIS — J449 Chronic obstructive pulmonary disease, unspecified: Secondary | ICD-10-CM | POA: Diagnosis not present

## 2019-05-29 MED ORDER — LOSARTAN POTASSIUM 100 MG PO TABS: 100.0000 mg | ORAL_TABLET | Freq: Every day | ORAL | 3 refills | Status: DC

## 2019-05-29 NOTE — Progress Notes (Signed)
Subjective:    Patient ID: Amanda Pope, female    DOB: [birth year], 74 y.o.   MRN: [id number]  HPI 01/06/19 Patient is being seen today for right flank and right lower quadrant abdominal pain.  She states that is been off and on for 2 weeks.  The pain initiates in her right flank and then radiates to around the right anterior superior iliac spine.  The pain is constant however has waves of intense pain.  Friday she woke up crying in intense pain but the pain gradually improved throughout the day.  Her ovaries have been removed.  She has a history of a cholecystectomy.  She is also had an appendectomy.  She states that over the last week she has been more constipated although she has a bowel movement every day is typically "rabbit pellets".  However there is no improvement of the pain with defecation.  She also has gross hematuria which is a chronic problem for her and has been worked up in the past.  However she has had episodes of dysuria over the last week as well.  At times the pain is 8 or 9 on a scale of 10 at other times is a 3 or 4.  At that time, my plan was: I suspect that the patient is experiencing nephrolithiasis.  Begin Flomax 0.4 mg p.o. daily and add oxycodone 5/325 1 to 2 tablets every 4-6 hours as needed for pain.  Obtain CT scan renal stone protocol to confirm diagnosis and also to confirm the size of the stone given the fact the pain has been present for 2 weeks as a suspect that the stone may be greater than 5 mm and may not be passing.  If the patient develops intense pain or symptoms change in any way I have  directed her to go to the emergency room for immediate imaging  CT IMPRESSION: 1. No definite explanation for patient's right-sided flank pain. Specifically, no evidence of nephrolithiasis or urinary obstruction. 2. Scattered colonic diverticulosis without evidence of superimposed acute diverticulitis on this noncontrast examination. 3. Post appendectomy and  cholecystectomy. 4. Suspected hepatic steatosis.  Correlation with LFTs is advised. 5.  Aortic Atherosclerosis (ICD10-I70.0).  01/20/19 As shown above, CT scan did not show any nephrolithiasis.  However patient states that 2 days prior to getting the CAT scan performed, the pain suddenly improved.  Since that time she has been pain-free leading me to suspect that the kidney stone may have passed.  At the present time she denies any right flank pain.  She denies any right-sided lower back pain.  She denies any abdominal pain.  Her abdomen is soft, nondistended, nontender.  She has no CVA tenderness.  There is no visible rash.  She is completely asymptomatic.  However her test showed a mild elevation in her liver function test.  Her fasting blood sugar was over 160 and her TSH was over 7.  She is here today to follow-up the lab work as well  05/29/19 Since I last saw the patient, she has lost 15 pounds.  She is working on decreasing her consumption of sodas and sweets.  She is also exercising.  Her blood pressure is elevated today and I confirm this.  She has not been checking it at home.  She denies any chest pain shortness of breath or dyspnea on exertion.  She has been taking Metformin for diabetes.  Her last A1c was greater than 7.  She does report some diarrhea  secondary to the Metformin.  She denies any polyuria, polydipsia or blurry vision.  She denies any nausea or vomiting.  Past Medical History:  Diagnosis Date  . Arthritis    hip & back  . Asthma   . COPD (chronic obstructive pulmonary disease) ([place])   . Cystocele   . Depression   . GERD (gastroesophageal reflux disease)   . History of Helicobacter pylori infection   . Hypertension   . Shortness of breath dyspnea   . Urticaria   . Vitamin D deficiency    Past Surgical History:  Procedure Laterality Date  . ABDOMINAL HYSTERECTOMY     And Removed 1 ovary  . APPENDECTOMY    . CHOLECYSTECTOMY  05/2007  . TONSILLECTOMY    . TOTAL  HIP ARTHROPLASTY Right 06/09/2015  . TOTAL HIP ARTHROPLASTY Right 06/09/2015   Procedure: RIGHT TOTAL HIP ARTHROPLASTY ANTERIOR APPROACH;  Surgeon: [name], MD;  Location: [facility];  Service: Orthopedics;  Laterality: Right;  . TUBAL LIGATION     Current Outpatient Medications on File Prior to Visit  Medication Sig Dispense Refill  . Accu-Chek FastClix Lancets MISC Use to check FBS daily DX: E11.9 100 each 3  . albuterol (PROVENTIL HFA;VENTOLIN HFA) 108 (90 Base) MCG/ACT inhaler Inhale 2 puffs into the lungs every 6 (six) hours as needed for wheezing or shortness of breath. 1 Inhaler 3  . aspirin EC 325 MG tablet Take 1 tablet (325 mg total) by mouth 2 (two) times daily. 84 tablet 0  . blood glucose meter kit and supplies Dispense based on patient and insurance preference. Use up to 1 time daily as directed. (FOR ICD-10 E10.9, E11.9). 1 each 0  . Blood Glucose Monitoring Suppl (ACCU-CHEK AVIVA PLUS) w/Device KIT Use to check FBS daily DX: E11.9 1 kit 1  . budesonide-formoterol (SYMBICORT) 160-4.5 MCG/ACT inhaler Inhale 2 puffs into the lungs 2 (two) times daily. 10.2 Inhaler 3  . calcium gluconate 500 MG tablet Take 1 tablet (500 mg total) by mouth 3 (three) times daily. 30 tablet 11  . Continuous Blood Gluc Receiver DEVI 1 Device by Does not apply route daily. Please dispense based on patient and insurance preference. Use as directed to monitor blood glucose levels. Dx: E11.9. 1 each 1  . Continuous Blood Gluc Transmit MISC 1 each by Does not apply route daily. Please dispense based on patient and insurance preference. Use as directed to monitor blood glucose levels. Dx: E11.9. 2 each 11  . [employer] D3 2000 units CAPS TAKE 2 CAPSULES BY MOUTH ONCE A DAY 60 capsule 9  . diazepam (VALIUM) 5 MG tablet Take 1-2 tablets (5-10 mg total) by mouth every 6 (six) hours as needed for muscle spasms. 60 tablet 0  . estradiol (ESTRACE) 0.5 MG tablet Take 1 tablet (0.5 mg total) by mouth daily. 90 tablet 0  .  glucose blood (ACCU-CHEK AVIVA PLUS) test strip Use to check FBS daily DX: E11.9 100 each 12  . hydrochlorothiazide (HYDRODIURIL) 25 MG tablet TAKE 1 TABLET BY MOUTH EVERY DAY 90 tablet 2  . Lancets Misc. (ACCU-CHEK FASTCLIX LANCET) KIT Use to check FBS daily DX: E11.9 1 kit 1  . levocetirizine (XYZAL) 5 MG tablet TAKE 1 TABLET BY MOUTH EVERY DAY IN THE EVENING 90 tablet 1  . metFORMIN (GLUCOPHAGE) 1000 MG tablet TAKE 1 TABLET (1,000 MG TOTAL) BY MOUTH 2 (TWO) TIMES DAILY WITH A MEAL. 180 tablet 1  . omeprazole (PRILOSEC) 20 MG capsule TAKE 1 CAPSULE BY  MOUTH EVERY DAY 90 capsule 3  . oxyCODONE-acetaminophen (PERCOCET) 5-325 MG tablet Take 1-2 tablets by mouth every 4 (four) hours as needed for severe pain. 30 tablet 0  . tamsulosin (FLOMAX) 0.4 MG CAPS capsule TAKE 1 CAPSULE BY MOUTH EVERY DAY 90 capsule 3   No current facility-administered medications on file prior to visit.   Allergies  Allergen Reactions  . Prevalite [Cholestyramine Light] Hives  . Ace Inhibitors Rash   Social History   Socioeconomic History  . Marital status: Married    Spouse name: Not on file  . Number of children: Not on file  . Years of education: Not on file  . Highest education level: Not on file  Occupational History  . Not on file  Tobacco Use  . Smoking status: Former Smoker    Packs/day: 1.00    Types: Cigarettes    Start date: 01/29/1965    Quit date: 10/30/2010    Years since quitting: 8.5  . Smokeless tobacco: Never Used  Substance and Sexual Activity  . Alcohol use: No    Alcohol/week: 0.0 standard drinks  . Drug use: No  . Sexual activity: Not on file  Other Topics Concern  . Not on file  Social History Narrative   Entered 07/2013:   Is retired. 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 Loves it.   Married.   Social Determinants of Health   Financial Resource Strain:   . Difficulty of Paying Living Expenses:   Food Insecurity:   . Worried About Charity fundraiser in the Last Year:   . Academic librarian in the Last Year:   Transportation Needs:   . Film/video editor (Medical):   Marland Kitchen Lack of Transportation (Non-Medical):   Physical Activity:   . Days of Exercise per Week:   . Minutes of Exercise per Session:   Stress:   . Feeling of Stress :   Social Connections:   . Frequency of Communication with Friends and Family:   . Frequency of Social Gatherings with Friends and Family:   . Attends Religious Services:   . Active Member of Clubs or Organizations:   . Attends Archivist Meetings:   Marland Kitchen Marital Status:   Intimate Partner Violence:   . Fear of Current or Ex-Partner:   . Emotionally Abused:   Marland Kitchen Physically Abused:   . Sexually Abused:      Review of Systems  All other systems reviewed and are negative.      Objective:   Physical Exam Constitutional:      General: She is not in acute distress.    Appearance: Normal appearance. She is not ill-appearing, toxic-appearing or diaphoretic.  Cardiovascular:     Rate and Rhythm: Normal rate and regular rhythm.     Heart sounds: Normal heart sounds. No murmur.  Pulmonary:     Effort: Pulmonary effort is normal. No respiratory distress.     Breath sounds: Normal breath sounds. No stridor. No wheezing, rhonchi or rales.  Chest:     Chest wall: No tenderness.  Abdominal:     General: Abdomen is flat. Bowel sounds are normal. There is no distension.     Palpations: Abdomen is soft.     Tenderness: There is no abdominal tenderness. There is no right CVA tenderness or guarding.  Neurological:     Mental Status: She is alert.           Assessment & Plan:  Elevated liver enzymes  Diabetes  mellitus type 2 with complications ([place]) - Plan: Hemoglobin A1c, CBC with Differential/Platelet, COMPLETE METABOLIC PANEL WITH GFR, Lipid panel  Congratulated the patient on losing 15 pounds.  Check hemoglobin A1c along with a CMP and a fasting lipid panel.  If hemoglobin A1c is less than 6.5 I will decrease her dose of  Metformin.  Goal LDL cholesterol is less than 100.  I believe the patient's elevated liver function test are most likely due to fatty liver disease.  Recommended therapeutic lifestyle changes including continued exercise and weight loss.  Blood pressure is elevated.  Add losartan 100 mg a day and recheck in 2 weeks.

## 2019-05-30 LAB — COMPLETE METABOLIC PANEL WITH GFR
AG Ratio: 1.8 (calc) (ref 1.0–2.5)
ALT: 34 U/L — ABNORMAL HIGH (ref 6–29)
AST: 33 U/L (ref 10–35)
Albumin: 4.3 g/dL (ref 3.6–5.1)
Alkaline phosphatase (APISO): 90 U/L (ref 37–153)
BUN: 13 mg/dL (ref 7–25)
CO2: 28 mmol/L (ref 20–32)
Calcium: 9.6 mg/dL (ref 8.6–10.4)
Chloride: 102 mmol/L (ref 98–110)
Creat: 0.83 mg/dL (ref 0.60–0.93)
GFR, Est African American: 82 mL/min/{1.73_m2} (ref >=60)
GFR, Est Non African American: 70 mL/min/{1.73_m2} (ref >=60)
Globulin: 2.4 g/dL (calc) (ref 1.9–3.7)
Glucose, Bld: 109 mg/dL — ABNORMAL HIGH (ref 65–99)
Potassium: 3.7 mmol/L (ref 3.5–5.3)
Sodium: 143 mmol/L (ref 135–146)
Total Bilirubin: 1 mg/dL (ref 0.2–1.2)
Total Protein: 6.7 g/dL (ref 6.1–8.1)

## 2019-05-30 LAB — CBC WITH DIFFERENTIAL/PLATELET
Absolute Monocytes: 844 cells/uL (ref 200–950)
Basophils Absolute: 88 cells/uL (ref 0–200)
Basophils Relative: 0.7 %
Eosinophils Absolute: 2066 cells/uL — ABNORMAL HIGH (ref 15–500)
Eosinophils Relative: 16.4 %
HCT: 41.5 % (ref 35.0–45.0)
Hemoglobin: 13.8 g/dL (ref 11.7–15.5)
Lymphs Abs: 3289 cells/uL (ref 850–3900)
MCH: 29.8 pg (ref 27.0–33.0)
MCHC: 33.3 g/dL (ref 32.0–36.0)
MCV: 89.6 fL (ref 80.0–100.0)
MPV: 10.4 fL (ref 7.5–12.5)
Monocytes Relative: 6.7 %
Neutro Abs: 6313 cells/uL (ref 1500–7800)
Neutrophils Relative %: 50.1 %
Platelets: 260 10*3/uL (ref 140–400)
RBC: 4.63 10*6/uL (ref 3.80–5.10)
RDW: 13.1 % (ref 11.0–15.0)
Total Lymphocyte: 26.1 %
WBC: 12.6 10*3/uL — ABNORMAL HIGH (ref 3.8–10.8)

## 2019-05-30 LAB — LIPID PANEL
Cholesterol: 165 mg/dL (ref <200)
HDL: 42 mg/dL — ABNORMAL LOW (ref >=50)
LDL Cholesterol (Calc): 93 mg/dL (calc)
Non-HDL Cholesterol (Calc): 123 mg/dL (calc) (ref <130)
Total CHOL/HDL Ratio: 3.9 (calc) (ref <5.0)
Triglycerides: 198 mg/dL — ABNORMAL HIGH (ref <150)

## 2019-05-30 LAB — HEMOGLOBIN A1C
Hgb A1c MFr Bld: 5.7 % of total Hgb — ABNORMAL HIGH (ref <5.7)
Mean Plasma Glucose: 117 (calc)
eAG (mmol/L): 6.5 (calc)

## 2019-06-01 ENCOUNTER — Encounter: Payer: Self-pay | Admitting: Family Medicine

## 2019-06-03 ENCOUNTER — Encounter: Payer: Self-pay | Admitting: Family Medicine

## 2019-07-08 ENCOUNTER — Other Ambulatory Visit: Payer: Self-pay

## 2019-07-08 MED ORDER — LEVOCETIRIZINE DIHYDROCHLORIDE 5 MG PO TABS: 5.0000 mg | ORAL_TABLET | Freq: Every evening | ORAL | 1 refills | Status: DC

## 2019-07-08 MED ORDER — ESTRADIOL 0.5 MG PO TABS: 0.5000 mg | ORAL_TABLET | Freq: Every day | ORAL | 0 refills | Status: DC

## 2019-07-08 NOTE — Telephone Encounter (Signed)
Last OV 05/29/19

## 2019-10-06 ENCOUNTER — Other Ambulatory Visit: Payer: Self-pay | Admitting: Family Medicine

## 2019-10-27 ENCOUNTER — Other Ambulatory Visit: Payer: Self-pay | Admitting: Family Medicine

## 2019-10-27 DIAGNOSIS — I10 Essential (primary) hypertension: Secondary | ICD-10-CM

## 2019-11-06 ENCOUNTER — Other Ambulatory Visit: Payer: Self-pay | Admitting: Family Medicine

## 2019-11-26 ENCOUNTER — Other Ambulatory Visit: Payer: Self-pay | Admitting: Family Medicine

## 2019-12-01 NOTE — Telephone Encounter (Signed)
Ok to refill 

## 2019-12-04 ENCOUNTER — Other Ambulatory Visit: Payer: Self-pay | Admitting: Family Medicine

## 2019-12-04 ENCOUNTER — Ambulatory Visit (INDEPENDENT_AMBULATORY_CARE_PROVIDER_SITE_OTHER): Payer: Medicare HMO | Admitting: Family Medicine

## 2019-12-04 ENCOUNTER — Other Ambulatory Visit: Payer: Self-pay

## 2019-12-04 VITALS — BP 128/70 | HR 82 | Temp 98.1°F | Ht 64.0 in | Wt 195.0 lb

## 2019-12-04 DIAGNOSIS — E118 Type 2 diabetes mellitus with unspecified complications: Secondary | ICD-10-CM

## 2019-12-04 DIAGNOSIS — J302 Other seasonal allergic rhinitis: Secondary | ICD-10-CM | POA: Diagnosis not present

## 2019-12-04 MED ORDER — FLUTICASONE PROPIONATE 50 MCG/ACT NA SUSP: 2.0000 | Freq: Every day | NASAL | 6 refills | Status: DC

## 2019-12-04 MED ORDER — PREDNISONE 20 MG PO TABS: ORAL_TABLET | ORAL | 0 refills | Status: DC

## 2019-12-04 MED ORDER — HYDROCODONE-HOMATROPINE 5-1.5 MG/5ML PO SYRP: 5.0000 mL | ORAL_SOLUTION | Freq: Three times a day (TID) | ORAL | 0 refills | Status: DC | PRN

## 2019-12-04 NOTE — Progress Notes (Signed)
Subjective:    Patient ID: Amanda Pope, female    DOB: 1945-12-03, 74 y.o.   MRN: [id number]  Patient patient is here today for follow-up on her diabetes.  She is actually taking Metformin 1000 mg once a day rather than twice a day.  She denies any polyuria, polydipsia, blurry vision.  She denies any chest pain shortness of breath or dyspnea on exertion.  Her blood pressures well controlled at 128/70.  She is on losartan for hypertension as well as renal protection along with hydrochlorothiazide.  She is not on a statin despite being a diabetic.  I have no documented history of statin intolerance.  She does however report a cough.  She states that she gets allergies every year despite taking Xyzal.  Over the last 2 weeks she has been having a lot of head congestion rhinorrhea sneezing and itchy watery eyes.  Then she has developed a cough and wheezing.  The cough keeps her awake at night.  Today on exam her lungs are clear to auscultation bilaterally but she does have head congestion and sinus pressure. Past Medical History:  Diagnosis Date  . Arthritis    hip & back  . Asthma   . COPD (chronic obstructive pulmonary disease) ([place])   . Cystocele   . Depression   . GERD (gastroesophageal reflux disease)   . History of Helicobacter pylori infection   . Hypertension   . Shortness of breath dyspnea   . Urticaria   . Vitamin D deficiency    Past Surgical History:  Procedure Laterality Date  . ABDOMINAL HYSTERECTOMY     And Removed 1 ovary  . APPENDECTOMY    . CHOLECYSTECTOMY  05/2007  . TONSILLECTOMY    . TOTAL HIP ARTHROPLASTY Right 06/09/2015  . TOTAL HIP ARTHROPLASTY Right 06/09/2015   Procedure: RIGHT TOTAL HIP ARTHROPLASTY ANTERIOR APPROACH;  Surgeon: [name], MD;  Location: [facility];  Service: Orthopedics;  Laterality: Right;  . TUBAL LIGATION     Current Outpatient Medications on File Prior to Visit  Medication Sig Dispense Refill  . Accu-Chek FastClix Lancets MISC Use to  check FBS daily DX: E11.9 100 each 3  . albuterol (PROVENTIL HFA;VENTOLIN HFA) 108 (90 Base) MCG/ACT inhaler Inhale 2 puffs into the lungs every 6 (six) hours as needed for wheezing or shortness of breath. 1 Inhaler 3  . aspirin EC 325 MG tablet Take 1 tablet (325 mg total) by mouth 2 (two) times daily. 84 tablet 0  . blood glucose meter kit and supplies Dispense based on patient and insurance preference. Use up to 1 time daily as directed. (FOR ICD-10 E10.9, E11.9). 1 each 0  . Blood Glucose Monitoring Suppl (ACCU-CHEK AVIVA PLUS) w/Device KIT Use to check FBS daily DX: E11.9 1 kit 1  . budesonide-formoterol (SYMBICORT) 160-4.5 MCG/ACT inhaler Inhale 2 puffs into the lungs 2 (two) times daily. 10.2 Inhaler 3  . calcium gluconate 500 MG tablet Take 1 tablet (500 mg total) by mouth 3 (three) times daily. 30 tablet 11  . Continuous Blood Gluc Receiver DEVI 1 Device by Does not apply route daily. Please dispense based on patient and insurance preference. Use as directed to monitor blood glucose levels. Dx: E11.9. 1 each 1  . Continuous Blood Gluc Transmit MISC 1 each by Does not apply route daily. Please dispense based on patient and insurance preference. Use as directed to monitor blood glucose levels. Dx: E11.9. 2 each 11  . CVS D3 2000 units  CAPS TAKE 2 CAPSULES BY MOUTH ONCE A DAY 60 capsule 9  . estradiol (ESTRACE) 0.5 MG tablet TAKE 1 TABLET EVERY DAY 90 tablet 0  . glucose blood (ACCU-CHEK AVIVA PLUS) test strip Use to check FBS daily DX: E11.9 100 each 12  . hydrochlorothiazide (HYDRODIURIL) 25 MG tablet TAKE 1 TABLET BY MOUTH EVERY DAY 90 tablet 2  . Lancets Misc. (ACCU-CHEK FASTCLIX LANCET) KIT Use to check FBS daily DX: E11.9 1 kit 1  . levocetirizine (XYZAL) 5 MG tablet Take 1 tablet (5 mg total) by mouth every evening. 90 tablet 1  . losartan (COZAAR) 100 MG tablet Take 1 tablet (100 mg total) by mouth daily. 90 tablet 3  . metFORMIN (GLUCOPHAGE) 1000 MG tablet TAKE 1 TABLET (1,000 MG  TOTAL) BY MOUTH 2 (TWO) TIMES DAILY WITH A MEAL. (Patient taking differently: Take 500 mg by mouth 2 (two) times daily with a meal. ) 180 tablet 1  . omeprazole (PRILOSEC) 20 MG capsule TAKE 1 CAPSULE BY MOUTH EVERY DAY 90 capsule 3   No current facility-administered medications on file prior to visit.   Allergies  Allergen Reactions  . Prevalite [Cholestyramine Light] Hives  . Ace Inhibitors Rash   Social History   Socioeconomic History  . Marital status: Married    Spouse name: Not on file  . Number of children: Not on file  . Years of education: Not on file  . Highest education level: Not on file  Occupational History  . Not on file  Tobacco Use  . Smoking status: Former Smoker    Packs/day: 1.00    Types: Cigarettes    Start date: 01/29/1965    Quit date: 10/30/2010    Years since quitting: 9.1  . Smokeless tobacco: Never Used  Substance and Sexual Activity  . Alcohol use: No    Alcohol/week: 0.0 standard drinks  . Drug use: No  . Sexual activity: Not on file  Other Topics Concern  . Not on file  Social History Narrative   Entered 07/2013:   Is retired. 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 Loves it.   Married.   Social Determinants of Health   Financial Resource Strain:   . Difficulty of Paying Living Expenses: Not on file  Food Insecurity:   . Worried About Charity fundraiser in the Last Year: Not on file  . Ran Out of Food in the Last Year: Not on file  Transportation Needs:   . Lack of Transportation (Medical): Not on file  . Lack of Transportation (Non-Medical): Not on file  Physical Activity:   . Days of Exercise per Week: Not on file  . Minutes of Exercise per Session: Not on file  Stress:   . Feeling of Stress : Not on file  Social Connections:   . Frequency of Communication with Friends and Family: Not on file  . Frequency of Social Gatherings with Friends and Family: Not on file  . Attends Religious Services: Not on file  . Active Member of Clubs or  Organizations: Not on file  . Attends Archivist Meetings: Not on file  . Marital Status: Not on file  Intimate Partner Violence:   . Fear of Current or Ex-Partner: Not on file  . Emotionally Abused: Not on file  . Physically Abused: Not on file  . Sexually Abused: Not on file     Review of Systems  All other systems reviewed and are negative.      Objective:  Physical Exam Constitutional:      General: She is not in acute distress.    Appearance: Normal appearance. She is not ill-appearing, toxic-appearing or diaphoretic.  HENT:     Nose: Congestion present.  Cardiovascular:     Rate and Rhythm: Normal rate and regular rhythm.     Heart sounds: Normal heart sounds. No murmur heard.   Pulmonary:     Effort: Pulmonary effort is normal. No respiratory distress.     Breath sounds: Normal breath sounds. No stridor. No wheezing, rhonchi or rales.  Chest:     Chest wall: No tenderness.  Abdominal:     General: Abdomen is flat. Bowel sounds are normal. There is no distension.     Palpations: Abdomen is soft.     Tenderness: There is no abdominal tenderness. There is no right CVA tenderness or guarding.  Neurological:     Mental Status: She is alert.           Assessment & Plan:  Diabetes mellitus type 2 with complications ([place]) - Plan: CBC with Differential/Platelet, COMPLETE METABOLIC PANEL WITH GFR, Lipid panel, Hemoglobin A1c  Check hemoglobin A1c.  If hemoglobin A1c is greater than 6.5, we will need to try to increase the Metformin back to 1000 mg twice a day.  For the time being I recommended that she at least take the Metformin 500 mg twice a day to provide 24-hour day coverage.  I will check a CBC, CMP, fasting lipid panel, in addition.  If A1c confirms diabetes I would also recommend adding a low-dose statin.  Blood pressure is acceptable.  Treat her allergies with a prednisone taper pack.  In the future recommended using Flonase 2 sprays each nostril daily  as a preventative to keep it from getting this bad.

## 2019-12-05 LAB — COMPLETE METABOLIC PANEL WITH GFR
AG Ratio: 1.5 (calc) (ref 1.0–2.5)
ALT: 17 U/L (ref 6–29)
AST: 17 U/L (ref 10–35)
Albumin: 4.1 g/dL (ref 3.6–5.1)
Alkaline phosphatase (APISO): 102 U/L (ref 37–153)
BUN: 16 mg/dL (ref 7–25)
CO2: 28 mmol/L (ref 20–32)
Calcium: 9.3 mg/dL (ref 8.6–10.4)
Chloride: 101 mmol/L (ref 98–110)
Creat: 0.77 mg/dL (ref 0.60–0.93)
GFR, Est African American: 89 mL/min/{1.73_m2} (ref >=60)
GFR, Est Non African American: 77 mL/min/{1.73_m2} (ref >=60)
Globulin: 2.7 g/dL (calc) (ref 1.9–3.7)
Glucose, Bld: 99 mg/dL (ref 65–99)
Potassium: 4 mmol/L (ref 3.5–5.3)
Sodium: 141 mmol/L (ref 135–146)
Total Bilirubin: 0.9 mg/dL (ref 0.2–1.2)
Total Protein: 6.8 g/dL (ref 6.1–8.1)

## 2019-12-05 LAB — HEMOGLOBIN A1C
Hgb A1c MFr Bld: 6.1 % of total Hgb — ABNORMAL HIGH (ref <5.7)
Mean Plasma Glucose: 128 (calc)
eAG (mmol/L): 7.1 (calc)

## 2019-12-05 LAB — CBC WITH DIFFERENTIAL/PLATELET
Absolute Monocytes: 925 cells/uL (ref 200–950)
Basophils Absolute: 109 cells/uL (ref 0–200)
Basophils Relative: 0.8 %
Eosinophils Absolute: 1646 cells/uL — ABNORMAL HIGH (ref 15–500)
Eosinophils Relative: 12.1 %
HCT: 38.5 % (ref 35.0–45.0)
Hemoglobin: 12.7 g/dL (ref 11.7–15.5)
Lymphs Abs: 3210 cells/uL (ref 850–3900)
MCH: 29.4 pg (ref 27.0–33.0)
MCHC: 33 g/dL (ref 32.0–36.0)
MCV: 89.1 fL (ref 80.0–100.0)
MPV: 9.9 fL (ref 7.5–12.5)
Monocytes Relative: 6.8 %
Neutro Abs: 7711 cells/uL (ref 1500–7800)
Neutrophils Relative %: 56.7 %
Platelets: 302 10*3/uL (ref 140–400)
RBC: 4.32 10*6/uL (ref 3.80–5.10)
RDW: 12.3 % (ref 11.0–15.0)
Total Lymphocyte: 23.6 %
WBC: 13.6 10*3/uL — ABNORMAL HIGH (ref 3.8–10.8)

## 2019-12-05 LAB — LIPID PANEL
Cholesterol: 157 mg/dL (ref <200)
HDL: 42 mg/dL — ABNORMAL LOW (ref >=50)
LDL Cholesterol (Calc): 91 mg/dL (calc)
Non-HDL Cholesterol (Calc): 115 mg/dL (calc) (ref <130)
Total CHOL/HDL Ratio: 3.7 (calc) (ref <5.0)
Triglycerides: 141 mg/dL (ref <150)

## 2019-12-07 ENCOUNTER — Other Ambulatory Visit: Payer: Self-pay

## 2019-12-07 DIAGNOSIS — J439 Emphysema, unspecified: Secondary | ICD-10-CM

## 2019-12-07 DIAGNOSIS — J209 Acute bronchitis, unspecified: Secondary | ICD-10-CM

## 2019-12-07 DIAGNOSIS — J45901 Unspecified asthma with (acute) exacerbation: Secondary | ICD-10-CM

## 2019-12-07 DIAGNOSIS — J449 Chronic obstructive pulmonary disease, unspecified: Secondary | ICD-10-CM

## 2019-12-07 MED ORDER — ALBUTEROL SULFATE HFA 108 (90 BASE) MCG/ACT IN AERS: 2.0000 | INHALATION_SPRAY | Freq: Four times a day (QID) | RESPIRATORY_TRACT | 3 refills | Status: AC | PRN

## 2019-12-07 MED ORDER — BUDESONIDE-FORMOTEROL FUMARATE 160-4.5 MCG/ACT IN AERO: 2.0000 | INHALATION_SPRAY | Freq: Two times a day (BID) | RESPIRATORY_TRACT | 3 refills | Status: DC

## 2019-12-08 ENCOUNTER — Other Ambulatory Visit: Payer: Self-pay

## 2019-12-08 MED ORDER — HYDROCODONE-HOMATROPINE 5-1.5 MG/5ML PO SYRP: 5.0000 mL | ORAL_SOLUTION | Freq: Three times a day (TID) | ORAL | 0 refills | Status: DC | PRN

## 2020-01-11 DIAGNOSIS — E119 Type 2 diabetes mellitus without complications: Secondary | ICD-10-CM | POA: Diagnosis not present

## 2020-01-11 DIAGNOSIS — H524 Presbyopia: Secondary | ICD-10-CM | POA: Diagnosis not present

## 2020-01-11 DIAGNOSIS — H4321 Crystalline deposits in vitreous body, right eye: Secondary | ICD-10-CM | POA: Diagnosis not present

## 2020-01-11 DIAGNOSIS — H40023 Open angle with borderline findings, high risk, bilateral: Secondary | ICD-10-CM | POA: Diagnosis not present

## 2020-01-11 DIAGNOSIS — H52223 Regular astigmatism, bilateral: Secondary | ICD-10-CM | POA: Diagnosis not present

## 2020-01-11 DIAGNOSIS — Z0100 Encounter for examination of eyes and vision without abnormal findings: Secondary | ICD-10-CM | POA: Diagnosis not present

## 2020-04-20 ENCOUNTER — Other Ambulatory Visit: Payer: Self-pay | Admitting: Family Medicine

## 2020-04-20 DIAGNOSIS — K219 Gastro-esophageal reflux disease without esophagitis: Secondary | ICD-10-CM

## 2020-05-13 ENCOUNTER — Telehealth: Payer: Self-pay | Admitting: Family Medicine

## 2020-05-13 NOTE — Progress Notes (Signed)
  Chronic Care Management   Outreach Note  05/13/2020 Name: Amanda Pope MRN: [id number] DOB: January 27, 1946  Referred by: [name], MD Reason for referral : No chief complaint on file.   An unsuccessful telephone outreach was attempted today. The patient was referred to the pharmacist for assistance with care management and care coordination.   Follow Up Plan:   Carley Perdue [employer] Scheduler

## 2020-05-20 ENCOUNTER — Ambulatory Visit: Payer: Medicare HMO

## 2020-05-20 ENCOUNTER — Telehealth: Payer: Self-pay | Admitting: Family Medicine

## 2020-05-20 NOTE — Progress Notes (Signed)
  Chronic Care Management   Outreach Note  05/20/2020 Name: Amanda Pope MRN: [id number] DOB: [birth year]  Referred by: [name], MD Reason for referral : No chief complaint on file.   An unsuccessful telephone outreach was attempted today. The patient was referred to the pharmacist for assistance with care management and care coordination.   Follow Up Plan:   Carley Perdue [employer] Scheduler

## 2020-05-23 NOTE — Progress Notes (Signed)
Subjective:   DANITY SCHMELZER is a 75 y.o. female who presents for Medicare Annual (Subsequent) preventive examination.  I connected with Fransisco Hertz today by telephone and verified that I am speaking with the correct person using two identifiers. Location patient: home Location provider: work Persons participating in the virtual visit: patient, provider.   I discussed the limitations, risks, security and privacy concerns of performing an evaluation and management service by telephone and the availability of in person appointments. I also discussed with the patient that there may be a patient responsible charge related to this service. The patient expressed understanding and verbally consented to this telephonic visit.    Interactive audio and video telecommunications were attempted between this provider and patient, however failed, due to patient having technical difficulties OR patient did not have access to video capability.  We continued and completed visit with audio only.        Review of Systems    N/A  Cardiac Risk Factors include: advanced age (>30mn, >>36women);dyslipidemia;hypertension     Objective:    Today's Vitals   There is no height or weight on file to calculate BMI.  Advanced Directives 05/27/2020 06/10/2015 06/02/2015  Does Patient Have a Medical Advance Directive? No No No  Would patient like information on creating a medical advance directive? No - Patient declined Yes - Educational materials given No - patient declined information    Current Medications (verified) Outpatient Encounter Medications as of 05/27/2020  Medication Sig  . Accu-Chek FastClix Lancets MISC Use to check FBS daily DX: E11.9  . albuterol (VENTOLIN HFA) 108 (90 Base) MCG/ACT inhaler Inhale 2 puffs into the lungs every 6 (six) hours as needed for wheezing or shortness of breath.  .Marland Kitchenaspirin EC 325 MG tablet Take 1 tablet (325 mg total) by mouth 2 (two) times daily.  . blood glucose meter  kit and supplies Dispense based on patient and insurance preference. Use up to 1 time daily as directed. (FOR ICD-10 E10.9, E11.9).  .Marland KitchenBlood Glucose Monitoring Suppl (ACCU-CHEK AVIVA PLUS) w/Device KIT Use to check FBS daily DX: E11.9  . budesonide-formoterol (SYMBICORT) 160-4.5 MCG/ACT inhaler Inhale 2 puffs into the lungs 2 (two) times daily.  . Continuous Blood Gluc Receiver DEVI 1 Device by Does not apply route daily. Please dispense based on patient and insurance preference. Use as directed to monitor blood glucose levels. Dx: E11.9.  . Continuous Blood Gluc Transmit MISC 1 each by Does not apply route daily. Please dispense based on patient and insurance preference. Use as directed to monitor blood glucose levels. Dx: E11.9.  . [employer] D3 2000 units CAPS TAKE 2 CAPSULES BY MOUTH ONCE A DAY  . estradiol (ESTRACE) 0.5 MG tablet TAKE 1 TABLET BY MOUTH EVERY DAY  . fluticasone (FLONASE) 50 MCG/ACT nasal spray SPRAY 2 SPRAYS INTO EACH NOSTRIL EVERY DAY  . glucose blood (ACCU-CHEK AVIVA PLUS) test strip Use to check FBS daily DX: E11.9  . hydrochlorothiazide (HYDRODIURIL) 25 MG tablet TAKE 1 TABLET BY MOUTH EVERY DAY  . Lancets Misc. (ACCU-CHEK FASTCLIX LANCET) KIT Use to check FBS daily DX: E11.9  . levocetirizine (XYZAL) 5 MG tablet TAKE 1 TABLET BY MOUTH EVERY DAY IN THE EVENING  . losartan (COZAAR) 100 MG tablet TAKE 1 TABLET BY MOUTH EVERY DAY  . metFORMIN (GLUCOPHAGE) 1000 MG tablet TAKE 1 TABLET (1,000 MG TOTAL) BY MOUTH 2 (TWO) TIMES DAILY WITH A MEAL.  .Marland Kitchenomeprazole (PRILOSEC) 20 MG capsule TAKE 1 CAPSULE BY  MOUTH EVERY DAY  . calcium gluconate 500 MG tablet Take 1 tablet (500 mg total) by mouth 3 (three) times daily. (Patient not taking: Reported on 05/27/2020)  . HYDROcodone-homatropine (HYCODAN) 5-1.5 MG/5ML syrup Take 5 mLs by mouth every 8 (eight) hours as needed for cough. (Patient not taking: Reported on 05/27/2020)  . [DISCONTINUED] predniSONE (DELTASONE) 20 MG tablet 3 tabs poqday 1-2, 2  tabs poqday 3-4, 1 tab poqday 5-6   No facility-administered encounter medications on file as of 05/27/2020.    Allergies (verified) Prevalite [cholestyramine light] and Ace inhibitors   History: Past Medical History:  Diagnosis Date  . Arthritis    hip & back  . Asthma   . COPD (chronic obstructive pulmonary disease) ([place])   . Cystocele   . Depression   . Diabetes mellitus without complication ([place])    Phreesia 05/27/2020  . GERD (gastroesophageal reflux disease)   . History of Helicobacter pylori infection   . Hypertension   . Shortness of breath dyspnea   . Urticaria   . Vitamin D deficiency    Past Surgical History:  Procedure Laterality Date  . ABDOMINAL HYSTERECTOMY     And Removed 1 ovary  . APPENDECTOMY    . CHOLECYSTECTOMY  05/2007  . TONSILLECTOMY    . TOTAL HIP ARTHROPLASTY Right 06/09/2015  . TOTAL HIP ARTHROPLASTY Right 06/09/2015   Procedure: RIGHT TOTAL HIP ARTHROPLASTY ANTERIOR APPROACH;  Surgeon: [name], MD;  Location: [facility];  Service: Orthopedics;  Laterality: Right;  . TUBAL LIGATION     Family History  Problem Relation Age of Onset  . Cancer Mother        colon  . Cancer Sister        ovarian  . Cancer Brother        melanoma  . Cancer Brother 36       colon cancer   Social History   Socioeconomic History  . Marital status: Married    Spouse name: Not on file  . Number of children: Not on file  . Years of education: Not on file  . Highest education level: Not on file  Occupational History  . Not on file  Tobacco Use  . Smoking status: Former Smoker    Packs/day: 1.00    Types: Cigarettes    Start date: 01/29/1965    Quit date: 10/30/2010    Years since quitting: 9.5  . Smokeless tobacco: Never Used  Substance and Sexual Activity  . Alcohol use: No    Alcohol/week: 0.0 standard drinks  . Drug use: No  . Sexual activity: Not on file  Other Topics Concern  . Not on file  Social History Narrative   Entered 07/2013:   Is  retired. 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   Married.   Social Determinants of Health   Financial Resource Strain: Low Risk   . Difficulty of Paying Living Expenses: Not hard at all  Food Insecurity: No Food Insecurity  . Worried About Charity fundraiser in the Last Year: Never true  . Ran Out of Food in the Last Year: Never true  Transportation Needs: No Transportation Needs  . Lack of Transportation (Medical): No  . Lack of Transportation (Non-Medical): No  Physical Activity: Inactive  . Days of Exercise per Week: 0 days  . Minutes of Exercise per Session: 0 min  Stress: No Stress Concern Present  . Feeling of Stress : Not at all  Social Connections: Moderately Isolated  .  Frequency of Communication with Friends and Family: More than three times a week  . Frequency of Social Gatherings with Friends and Family: More than three times a week  . Attends Religious Services: Never  . Active Member of Clubs or Organizations: No  . Attends Archivist Meetings: Never  . Marital Status: Married    Tobacco Counseling Counseling given: Not Answered   Clinical Intake:  Pre-visit preparation completed: Yes  Pain : No/denies pain     Nutritional Risks: None Diabetes: No  How often do you need to have someone help you when you read instructions, pamphlets, or other written materials from your doctor or pharmacy?: 1 - Never  Diabetic? No  Interpreter Needed?: No  Information entered by :: [place] of Daily Living In your present state of health, do you have any difficulty performing the following activities: 05/27/2020  Hearing? N  Vision? Y  Comment has issues with something being in visual field. Has been told that it is drooping of her eyelids  Difficulty concentrating or making decisions? Y  Comment has difficulty with short term memory  Walking or climbing stairs? Y  Comment has hip discomfort  Dressing or bathing? N  Doing errands,  shopping? N  Preparing Food and eating ? N  Using the Toilet? N  In the past six months, have you accidently leaked urine? Y  Comment has leakage with urgency. Wears poise pad  Do you have problems with loss of bowel control? Y  Comment has occassional loss of bowel control with bout of diarrhea  Managing your Medications? N  Managing your Finances? N  Housekeeping or managing your Housekeeping? N  Some recent data might be hidden    Patient Care Team: [name], MD as PCP - General (Family Medicine)  Indicate any recent Medical Services you may have received from other than [facility] providers in the past year (date may be approximate).     Assessment:   This is a routine wellness examination for [place].  Hearing/Vision screen  Hearing Screening   125Hz  250Hz  500Hz  1000Hz  2000Hz  3000Hz  4000Hz  6000Hz  8000Hz   Right ear:           Left ear:           Vision Screening Comments: Patient states gets eyes examined once per year. Patient currently wears glasses   Dietary issues and exercise activities discussed: Current Exercise Habits: The patient does not participate in regular exercise at present, Exercise limited by: orthopedic condition(s)  Goals    . DIET - EAT MORE FRUITS AND VEGETABLES    . DIET - REDUCE SUGAR INTAKE      Depression Screen PHQ 2/9 Scores 05/27/2020 03/07/2017 02/08/2017 12/24/2016 05/09/2016 08/25/2015 08/27/2013  PHQ - 2 Score 0 0 2 0 0 0 0  PHQ- 9 Score - 0 6 - 0 - 2    Fall Risk Fall Risk  05/27/2020 08/29/2018 03/07/2017 12/24/2016 08/25/2015  Falls in the past year? 0 0 No Yes Yes  Comment - Emmi Telephone Survey: data to providers prior to load - - -  Number falls in past yr: 0 - - 2 or more 1  Injury with Fall? 0 - - No No  Risk for fall due to : No Fall Risks - - - Impaired mobility  Risk for fall due to: Comment - - - - temp using cane s/p thr  Follow up Falls evaluation completed;Falls prevention discussed - - - -  FALL RISK PREVENTION  PERTAINING TO THE HOME:  Any stairs in or around the home? No  If so, are there any without handrails? No  Home free of loose throw rugs in walkways, pet beds, electrical cords, etc? Yes  Adequate lighting in your home to reduce risk of falls? Yes   ASSISTIVE DEVICES UTILIZED TO PREVENT FALLS:  Life alert? No  Use of a cane, walker or w/c? No  Grab bars in the bathroom? No  Shower chair or bench in shower? No  Elevated toilet seat or a handicapped toilet? Yes    Cognitive Function:   Normal cognitive status assessed by direct observation by this Nurse Health Advisor. No abnormalities found.     6CIT Screen 05/27/2020  What Year? 0 points  What month? 0 points  What time? 0 points  Count back from 20 0 points  Months in reverse 0 points  Repeat phrase 6 points  Total Score 6    Immunizations Immunization History  Administered Date(s) Administered  . Influenza, High Dose Seasonal PF 11/21/2017  . Influenza,inj,Quad PF,6+ Mos 01/12/2014  . Pneumococcal Conjugate-13 08/27/2013  . Pneumococcal Polysaccharide-23 08/25/2015  . Tdap 08/27/2013    TDAP status: Up to date  Flu Vaccine status: Due, Education has been provided regarding the importance of this vaccine. Advised may receive this vaccine at local pharmacy or Health Dept. Aware to provide a copy of the vaccination record if obtained from local pharmacy or Health Dept. Verbalized acceptance and understanding.  Pneumococcal vaccine status: Up to date  Covid-19 vaccine status: Declined, Education has been provided regarding the importance of this vaccine but patient still declined. Advised may receive this vaccine at local pharmacy or Health Dept.or vaccine clinic. Aware to provide a copy of the vaccination record if obtained from local pharmacy or Health Dept. Verbalized acceptance and understanding.  Qualifies for Shingles Vaccine? Yes   Zostavax completed No   Shingrix Completed?: No.    Education has been provided  regarding the importance of this vaccine. Patient has been advised to call insurance company to determine out of pocket expense if they have not yet received this vaccine. Advised may also receive vaccine at local pharmacy or Health Dept. Verbalized acceptance and understanding.  Screening Tests Health Maintenance  Topic Date Due  . COLONOSCOPY (Pts 45-26yr Insurance coverage will need to be confirmed)  09/21/2017  . MAMMOGRAM  11/14/2017  . COVID-19 Vaccine (1) 06/12/2020 (Originally 02/29/1950)  . INFLUENZA VACCINE  08/29/2020  . TETANUS/TDAP  08/28/2023  . DEXA SCAN  Completed  . Hepatitis C Screening  Completed  . PNA vac Low Risk Adult  Completed  . HPV VACCINES  Aged Out    Health Maintenance  Health Maintenance Due  Topic Date Due  . COLONOSCOPY (Pts 45-480yrInsurance coverage will need to be confirmed)  09/21/2017  . MAMMOGRAM  11/14/2017    Colorectal cancer screening: Referral to GI placed 05/27/2020. Pt aware the office will call re: appt.  Mammogram status: Ordered 05/27/2020. Pt provided with contact info and advised to call to schedule appt.   Bone Density status: Completed 11/15/2015. Results reflect: Bone density results: NORMAL. Repeat every 0 years.  Lung Cancer Screening: (Low Dose CT Chest recommended if Age 73-80ears, 30 pack-year currently smoking OR have quit w/in 15years.) does not qualify.   Lung Cancer Screening Referral: N/A   Additional Screening:  Hepatitis C Screening: does qualify;   Vision Screening: Recommended annual ophthalmology exams for early detection of glaucoma  and other disorders of the eye. Is the patient up to date with their annual eye exam?  Yes  Who is the provider or what is the name of the office in which the patient attends annual eye exams? Dr. Elta Guadeloupe If pt is not established with a provider, would they like to be referred to a provider to establish care? No .   Dental Screening: Recommended annual dental exams for proper  oral hygiene  Community Resource Referral / Chronic Care Management: CRR required this visit?  No   CCM required this visit?  No      Plan:     I have personally reviewed and noted the following in the patient's chart:   . Medical and social history . Use of alcohol, tobacco or illicit drugs  . Current medications and supplements . Functional ability and status . Nutritional status . Physical activity . Advanced directives . List of other physicians . Hospitalizations, surgeries, and ER visits in previous 12 months . Vitals . Screenings to include cognitive, depression, and falls . Referrals and appointments  In addition, I have reviewed and discussed with patient certain preventive protocols, quality metrics, and best practice recommendations. A written personalized care plan for preventive services as well as general preventive health recommendations were provided to patient.     Ofilia Neas, LPN   [id number]   Nurse Notes: None

## 2020-05-26 DIAGNOSIS — H40023 Open angle with borderline findings, high risk, bilateral: Secondary | ICD-10-CM | POA: Diagnosis not present

## 2020-05-27 ENCOUNTER — Ambulatory Visit (INDEPENDENT_AMBULATORY_CARE_PROVIDER_SITE_OTHER): Payer: Medicare HMO

## 2020-05-27 DIAGNOSIS — Z1211 Encounter for screening for malignant neoplasm of colon: Secondary | ICD-10-CM | POA: Diagnosis not present

## 2020-05-27 DIAGNOSIS — Z0000 Encounter for general adult medical examination without abnormal findings: Secondary | ICD-10-CM | POA: Diagnosis not present

## 2020-05-27 DIAGNOSIS — Z1231 Encounter for screening mammogram for malignant neoplasm of breast: Secondary | ICD-10-CM

## 2020-05-27 NOTE — Patient Instructions (Signed)
Amanda Pope , Thank you for taking time to come for your Medicare Wellness Visit. I appreciate your ongoing commitment to your health goals. Please review the following plan we discussed and let me know if I can assist you in the future.   Screening recommendations/referrals: Colonoscopy: Currently due, referral placed for Dr. Lorie Apley office Mammogram: Currently due, orders placed. Someone will call you from [facility] to get you scheduled  Bone Density: No longer required  Recommended yearly ophthalmology/optometry visit for glaucoma screening and checkup Recommended yearly dental visit for hygiene and checkup  Vaccinations: Influenza vaccine: Up to date, next due fall 2022  Pneumococcal vaccine: Completed series  Tdap vaccine: Up to date, next due 08/28/2023 Shingles vaccine: Currently due for Shingrix, if you would like to receive we recommend that you do so at your local pharmacy as it is less expensive     Advanced directives: Advance directive discussed with you today. Even though you declined this today please call our office should you change your mind and we can give you the proper paperwork for you to fill out.    Conditions/risks identified: None   Next appointment: 06/01/2021 @ 2:00 PM with Nurse Health Advisor via telephone    Preventive Care 65 Years and Older, Female Preventive care refers to lifestyle choices and visits with your health care provider that can promote health and wellness. What does preventive care include?  A yearly physical exam. This is also called an annual well check.  Dental exams once or twice a year.  Routine eye exams. Ask your health care provider how often you should have your eyes checked.  Personal lifestyle choices, including:  Daily care of your teeth and gums.  Regular physical activity.  Eating a healthy diet.  Avoiding tobacco and drug use.  Limiting alcohol use.  Practicing safe sex.  Taking low-dose aspirin every  day.  Taking vitamin and mineral supplements as recommended by your health care provider. What happens during an annual well check? The services and screenings done by your health care provider during your annual well check will depend on your age, overall health, lifestyle risk factors, and family history of disease. Counseling  Your health care provider may ask you questions about your:  Alcohol use.  Tobacco use.  Drug use.  Emotional well-being.  Home and relationship well-being.  Sexual activity.  Eating habits.  History of falls.  Memory and ability to understand (cognition).  Work and work Statistician.  Reproductive health. Screening  You may have the following tests or measurements:  Height, weight, and BMI.  Blood pressure.  Lipid and cholesterol levels. These may be checked every 5 years, or more frequently if you are over 60 years old.  Skin check.  Lung cancer screening. You may have this screening every year starting at age 13 if you have a 30-pack-year history of smoking and currently smoke or have quit within the past 15 years.  Fecal occult blood test (FOBT) of the stool. You may have this test every year starting at age 38.  Flexible sigmoidoscopy or colonoscopy. You may have a sigmoidoscopy every 5 years or a colonoscopy every 10 years starting at age 34.  Hepatitis C blood test.  Hepatitis B blood test.  Sexually transmitted disease (STD) testing.  Diabetes screening. This is done by checking your blood sugar (glucose) after you have not eaten for a while (fasting). You may have this done every 1-3 years.  Bone density scan. This is done  to screen for osteoporosis. You may have this done starting at age 92.  Mammogram. This may be done every 1-2 years. Talk to your health care provider about how often you should have regular mammograms. Talk with your health care provider about your test results, treatment options, and if necessary, the need  for more tests. Vaccines  Your health care provider may recommend certain vaccines, such as:  Influenza vaccine. This is recommended every year.  Tetanus, diphtheria, and acellular pertussis (Tdap, Td) vaccine. You may need a Td booster every 10 years.  Zoster vaccine. You may need this after age 35.  Pneumococcal 13-valent conjugate (PCV13) vaccine. One dose is recommended after age 73.  Pneumococcal polysaccharide (PPSV23) vaccine. One dose is recommended after age 19. Talk to your health care provider about which screenings and vaccines you need and how often you need them. This information is not intended to replace advice given to you by your health care provider. Make sure you discuss any questions you have with your health care provider. Document Released: 02/11/2015 Document Revised: 10/05/2015 Document Reviewed: 11/16/2014 Elsevier Interactive Patient Education  2017 [employer] Prevention in the Home Falls can cause injuries. They can happen to people of all ages. There are many things you can do to make your home safe and to help prevent falls. What can I do on the outside of my home?  Regularly fix the edges of walkways and driveways and fix any cracks.  Remove anything that might make you trip as you walk through a door, such as a raised step or threshold.  Trim any bushes or trees on the path to your home.  Use bright outdoor lighting.  Clear any walking paths of anything that might make someone trip, such as rocks or tools.  Regularly check to see if handrails are loose or broken. Make sure that both sides of any steps have handrails.  Any raised decks and porches should have guardrails on the edges.  Have any leaves, snow, or ice cleared regularly.  Use sand or salt on walking paths during winter.  Clean up any spills in your garage right away. This includes oil or grease spills. What can I do in the bathroom?  Use night lights.  Install grab bars  by the toilet and in the tub and shower. Do not use towel bars as grab bars.  Use non-skid mats or decals in the tub or shower.  If you need to sit down in the shower, use a plastic, non-slip stool.  Keep the floor dry. Clean up any water that spills on the floor as soon as it happens.  Remove soap buildup in the tub or shower regularly.  Attach bath mats securely with double-sided non-slip rug tape.  Do not have throw rugs and other things on the floor that can make you trip. What can I do in the bedroom?  Use night lights.  Make sure that you have a light by your bed that is easy to reach.  Do not use any sheets or blankets that are too big for your bed. They should not hang down onto the floor.  Have a firm chair that has side arms. You can use this for support while you get dressed.  Do not have throw rugs and other things on the floor that can make you trip. What can I do in the kitchen?  Clean up any spills right away.  Avoid walking on wet floors.  Keep items  that you use a lot in easy-to-reach places.  If you need to reach something above you, use a strong step stool that has a grab bar.  Keep electrical cords out of the way.  Do not use floor polish or wax that makes floors slippery. If you must use wax, use non-skid floor wax.  Do not have throw rugs and other things on the floor that can make you trip. What can I do with my stairs?  Do not leave any items on the stairs.  Make sure that there are handrails on both sides of the stairs and use them. Fix handrails that are broken or loose. Make sure that handrails are as long as the stairways.  Check any carpeting to make sure that it is firmly attached to the stairs. Fix any carpet that is loose or worn.  Avoid having throw rugs at the top or bottom of the stairs. If you do have throw rugs, attach them to the floor with carpet tape.  Make sure that you have a light switch at the top of the stairs and the  bottom of the stairs. If you do not have them, ask someone to add them for you. What else can I do to help prevent falls?  Wear shoes that:  Do not have high heels.  Have rubber bottoms.  Are comfortable and fit you well.  Are closed at the toe. Do not wear sandals.  If you use a stepladder:  Make sure that it is fully opened. Do not climb a closed stepladder.  Make sure that both sides of the stepladder are locked into place.  Ask someone to hold it for you, if possible.  Clearly mark and make sure that you can see:  Any grab bars or handrails.  First and last steps.  Where the edge of each step is.  Use tools that help you move around (mobility aids) if they are needed. These include:  Canes.  Walkers.  Scooters.  Crutches.  Turn on the lights when you go into a dark area. Replace any light bulbs as soon as they burn out.  Set up your furniture so you have a clear path. Avoid moving your furniture around.  If any of your floors are uneven, fix them.  If there are any pets around you, be aware of where they are.  Review your medicines with your doctor. Some medicines can make you feel dizzy. This can increase your chance of falling. Ask your doctor what other things that you can do to help prevent falls. This information is not intended to replace advice given to you by your health care provider. Make sure you discuss any questions you have with your health care provider. Document Released: 11/11/2008 Document Revised: 06/23/2015 Document Reviewed: 02/19/2014 Elsevier Interactive Patient Education  2017 [employer].

## 2020-06-08 ENCOUNTER — Telehealth: Payer: Self-pay | Admitting: Family Medicine

## 2020-06-08 NOTE — Progress Notes (Signed)
  Chronic Care Management   Outreach Note  06/08/2020 Name: Amanda Pope MRN: [id number] DOB: 09-15-1945  Referred by: [name], MD Reason for referral : No chief complaint on file.   Third unsuccessful telephone outreach was attempted today. The patient was referred to the pharmacist for assistance with care management and care coordination.   Follow Up Plan:   Carley Perdue [employer] Scheduler

## 2020-06-10 ENCOUNTER — Other Ambulatory Visit: Payer: Self-pay | Admitting: Family Medicine

## 2020-07-15 ENCOUNTER — Ambulatory Visit: Payer: Medicare HMO | Admitting: Family Medicine

## 2020-07-15 ENCOUNTER — Other Ambulatory Visit: Payer: Self-pay

## 2020-07-15 ENCOUNTER — Other Ambulatory Visit: Payer: Medicare HMO

## 2020-07-15 DIAGNOSIS — D72829 Elevated white blood cell count, unspecified: Secondary | ICD-10-CM

## 2020-07-15 DIAGNOSIS — E118 Type 2 diabetes mellitus with unspecified complications: Secondary | ICD-10-CM | POA: Diagnosis not present

## 2020-07-16 LAB — CBC WITH DIFFERENTIAL/PLATELET
Absolute Monocytes: 673 cells/uL (ref 200–950)
Basophils Absolute: 68 cells/uL (ref 0–200)
Basophils Relative: 0.6 %
Eosinophils Absolute: 1847 cells/uL — ABNORMAL HIGH (ref 15–500)
Eosinophils Relative: 16.2 %
HCT: 40 % (ref 35.0–45.0)
Hemoglobin: 13.3 g/dL (ref 11.7–15.5)
Lymphs Abs: 3659 cells/uL (ref 850–3900)
MCH: 29.4 pg (ref 27.0–33.0)
MCHC: 33.3 g/dL (ref 32.0–36.0)
MCV: 88.3 fL (ref 80.0–100.0)
MPV: 10.3 fL (ref 7.5–12.5)
Monocytes Relative: 5.9 %
Neutro Abs: 5153 cells/uL (ref 1500–7800)
Neutrophils Relative %: 45.2 %
Platelets: 292 10*3/uL (ref 140–400)
RBC: 4.53 10*6/uL (ref 3.80–5.10)
RDW: 12.7 % (ref 11.0–15.0)
Total Lymphocyte: 32.1 %
WBC: 11.4 10*3/uL — ABNORMAL HIGH (ref 3.8–10.8)

## 2020-07-16 LAB — HEMOGLOBIN A1C
Hgb A1c MFr Bld: 6.2 % of total Hgb — ABNORMAL HIGH (ref <5.7)
Mean Plasma Glucose: 131 mg/dL
eAG (mmol/L): 7.3 mmol/L

## 2020-07-19 DIAGNOSIS — R635 Abnormal weight gain: Secondary | ICD-10-CM | POA: Diagnosis not present

## 2020-07-19 DIAGNOSIS — Z800 Family history of malignant neoplasm of digestive organs: Secondary | ICD-10-CM | POA: Diagnosis not present

## 2020-07-19 DIAGNOSIS — Z1211 Encounter for screening for malignant neoplasm of colon: Secondary | ICD-10-CM | POA: Diagnosis not present

## 2020-07-19 DIAGNOSIS — K219 Gastro-esophageal reflux disease without esophagitis: Secondary | ICD-10-CM | POA: Diagnosis not present

## 2020-07-19 DIAGNOSIS — R1310 Dysphagia, unspecified: Secondary | ICD-10-CM | POA: Diagnosis not present

## 2020-07-19 DIAGNOSIS — E119 Type 2 diabetes mellitus without complications: Secondary | ICD-10-CM | POA: Diagnosis not present

## 2020-07-19 DIAGNOSIS — J449 Chronic obstructive pulmonary disease, unspecified: Secondary | ICD-10-CM | POA: Diagnosis not present

## 2020-07-19 DIAGNOSIS — K9089 Other intestinal malabsorption: Secondary | ICD-10-CM | POA: Diagnosis not present

## 2020-07-21 ENCOUNTER — Telehealth: Payer: Self-pay | Admitting: *Deleted

## 2020-07-21 NOTE — Telephone Encounter (Signed)
Received fax from [facility] with TSH results.   07/19/2020- TSH 5.47 (H).   PCP reviewed and recommendations are as follows: TSH borderline.  No changes at this time.   Call placed to patient and patient made aware.

## 2020-07-24 ENCOUNTER — Other Ambulatory Visit: Payer: Self-pay | Admitting: Family Medicine

## 2020-08-04 ENCOUNTER — Other Ambulatory Visit: Payer: Self-pay | Admitting: Family Medicine

## 2020-08-04 DIAGNOSIS — I10 Essential (primary) hypertension: Secondary | ICD-10-CM

## 2020-08-21 ENCOUNTER — Other Ambulatory Visit: Payer: Self-pay | Admitting: Family Medicine

## 2020-08-25 ENCOUNTER — Ambulatory Visit
Admission: RE | Admit: 2020-08-25 | Discharge: 2020-08-25 | Disposition: A | Discharge status: Home / Self Care | Payer: Medicare HMO | Source: Ambulatory Visit | Attending: Family Medicine | Admitting: Family Medicine

## 2020-08-25 ENCOUNTER — Other Ambulatory Visit: Payer: Self-pay

## 2020-08-25 DIAGNOSIS — Z1231 Encounter for screening mammogram for malignant neoplasm of breast: Secondary | ICD-10-CM

## 2020-09-05 ENCOUNTER — Other Ambulatory Visit: Payer: Self-pay | Admitting: Family Medicine

## 2020-09-12 DIAGNOSIS — Z800 Family history of malignant neoplasm of digestive organs: Secondary | ICD-10-CM | POA: Diagnosis not present

## 2020-09-12 DIAGNOSIS — Z1211 Encounter for screening for malignant neoplasm of colon: Secondary | ICD-10-CM | POA: Diagnosis not present

## 2020-09-12 DIAGNOSIS — D128 Benign neoplasm of rectum: Secondary | ICD-10-CM | POA: Diagnosis not present

## 2020-09-12 DIAGNOSIS — K5730 Diverticulosis of large intestine without perforation or abscess without bleeding: Secondary | ICD-10-CM | POA: Diagnosis not present

## 2020-09-12 DIAGNOSIS — K621 Rectal polyp: Secondary | ICD-10-CM | POA: Diagnosis not present

## 2020-09-14 ENCOUNTER — Other Ambulatory Visit: Payer: Self-pay | Admitting: Gastroenterology

## 2020-09-14 DIAGNOSIS — Q438 Other specified congenital malformations of intestine: Secondary | ICD-10-CM

## 2020-10-19 ENCOUNTER — Inpatient Hospital Stay: Admission: RE | Admit: 2020-10-19 | Payer: Medicare HMO | Source: Ambulatory Visit

## 2020-11-01 ENCOUNTER — Other Ambulatory Visit: Payer: Self-pay | Admitting: Family Medicine

## 2020-11-25 ENCOUNTER — Other Ambulatory Visit: Payer: Self-pay

## 2020-11-25 ENCOUNTER — Encounter: Payer: Self-pay | Admitting: Family Medicine

## 2020-11-25 ENCOUNTER — Ambulatory Visit (INDEPENDENT_AMBULATORY_CARE_PROVIDER_SITE_OTHER): Payer: Medicare HMO | Admitting: Family Medicine

## 2020-11-25 VITALS — BP 128/72 | HR 70 | Temp 97.1°F | Wt 207.0 lb

## 2020-11-25 DIAGNOSIS — E1140 Type 2 diabetes mellitus with diabetic neuropathy, unspecified: Secondary | ICD-10-CM

## 2020-11-25 DIAGNOSIS — H02409 Unspecified ptosis of unspecified eyelid: Secondary | ICD-10-CM | POA: Diagnosis not present

## 2020-11-25 DIAGNOSIS — E118 Type 2 diabetes mellitus with unspecified complications: Secondary | ICD-10-CM

## 2020-11-25 DIAGNOSIS — J449 Chronic obstructive pulmonary disease, unspecified: Secondary | ICD-10-CM

## 2020-11-25 MED ORDER — GABAPENTIN 100 MG PO CAPS: 100.0000 mg | ORAL_CAPSULE | Freq: Three times a day (TID) | ORAL | 3 refills | Status: DC | PRN

## 2020-11-26 LAB — CBC WITH DIFFERENTIAL/PLATELET
Absolute Monocytes: 902 cells/uL (ref 200–950)
Basophils Absolute: 76 cells/uL (ref 0–200)
Basophils Relative: 0.6 %
Eosinophils Absolute: 1245 cells/uL — ABNORMAL HIGH (ref 15–500)
Eosinophils Relative: 9.8 %
HCT: 37.8 % (ref 35.0–45.0)
Hemoglobin: 12.8 g/dL (ref 11.7–15.5)
Lymphs Abs: 3924 cells/uL — ABNORMAL HIGH (ref 850–3900)
MCH: 29.4 pg (ref 27.0–33.0)
MCHC: 33.9 g/dL (ref 32.0–36.0)
MCV: 86.9 fL (ref 80.0–100.0)
MPV: 10.4 fL (ref 7.5–12.5)
Monocytes Relative: 7.1 %
Neutro Abs: 6553 cells/uL (ref 1500–7800)
Neutrophils Relative %: 51.6 %
Platelets: 292 10*3/uL (ref 140–400)
RBC: 4.35 10*6/uL (ref 3.80–5.10)
RDW: 12.8 % (ref 11.0–15.0)
Total Lymphocyte: 30.9 %
WBC: 12.7 10*3/uL — ABNORMAL HIGH (ref 3.8–10.8)

## 2020-11-26 LAB — COMPLETE METABOLIC PANEL WITH GFR
AG Ratio: 1.8 (calc) (ref 1.0–2.5)
ALT: 29 U/L (ref 6–29)
AST: 24 U/L (ref 10–35)
Albumin: 4.4 g/dL (ref 3.6–5.1)
Alkaline phosphatase (APISO): 86 U/L (ref 37–153)
BUN: 17 mg/dL (ref 7–25)
CO2: 28 mmol/L (ref 20–32)
Calcium: 10 mg/dL (ref 8.6–10.4)
Chloride: 102 mmol/L (ref 98–110)
Creat: 0.79 mg/dL (ref 0.60–1.00)
Globulin: 2.5 g/dL (calc) (ref 1.9–3.7)
Glucose, Bld: 120 mg/dL — ABNORMAL HIGH (ref 65–99)
Potassium: 3.9 mmol/L (ref 3.5–5.3)
Sodium: 142 mmol/L (ref 135–146)
Total Bilirubin: 0.7 mg/dL (ref 0.2–1.2)
Total Protein: 6.9 g/dL (ref 6.1–8.1)
eGFR: 79 mL/min/{1.73_m2} (ref >=60)

## 2020-11-26 LAB — LIPID PANEL
Cholesterol: 193 mg/dL (ref <200)
HDL: 43 mg/dL — ABNORMAL LOW (ref >=50)
LDL Cholesterol (Calc): 111 mg/dL (calc) — ABNORMAL HIGH
Non-HDL Cholesterol (Calc): 150 mg/dL (calc) — ABNORMAL HIGH (ref <130)
Total CHOL/HDL Ratio: 4.5 (calc) (ref <5.0)
Triglycerides: 273 mg/dL — ABNORMAL HIGH (ref <150)

## 2020-11-26 LAB — HEMOGLOBIN A1C
Hgb A1c MFr Bld: 6.3 % of total Hgb — ABNORMAL HIGH (ref <5.7)
Mean Plasma Glucose: 134 mg/dL
eAG (mmol/L): 7.4 mmol/L

## 2020-11-28 ENCOUNTER — Encounter: Payer: Self-pay | Admitting: Family Medicine

## 2020-11-28 NOTE — Progress Notes (Signed)
Subjective:    Patient ID: Amanda Pope, female    DOB: 1946/01/13, 75 y.o.   MRN: [id number]  Patient patient is here today for follow-up on her diabetes.  She continues to struggle with diarrhea on metformin despite taking a lower dose.  She also has developed a burning sensation in her feet per tickly at night that keeps her awake.  Sometimes she has to get up and walk across a cold hardwood floor just to relieve the sensation in her feet.  It is certainly affecting her sleep.  She is also requesting my opinion regarding an upcoming surgery.  She has a sagging eyelid and periorbital soft tissue affecting her vision.  She has seen an ophthalmologist who is recommended surgery to lift the eyelid.  At the present time, the sagging soft tissue makes it difficult for her to see at times.  She has to focus on keeping her eyes fully open and constantly raising her eyebrows in order to maintain her line of sight.  Therefore she would benefit from surgery to lift the eyelids so that her vision is not impaired.  She denies any chest pain shortness of breath or dyspnea on exertion Past Medical History:  Diagnosis Date   Arthritis    hip & back   Asthma    COPD (chronic obstructive pulmonary disease) ([place])    Cystocele    Depression    Diabetes mellitus without complication ([place])    Phreesia 05/27/2020   GERD (gastroesophageal reflux disease)    History of Helicobacter pylori infection    Hypertension    Shortness of breath dyspnea    Urticaria    Vitamin D deficiency    Past Surgical History:  Procedure Laterality Date   ABDOMINAL HYSTERECTOMY     And Removed 1 ovary   APPENDECTOMY     CHOLECYSTECTOMY  05/2007   TONSILLECTOMY     TOTAL HIP ARTHROPLASTY Right 06/09/2015   TOTAL HIP ARTHROPLASTY Right 06/09/2015   Procedure: RIGHT TOTAL HIP ARTHROPLASTY ANTERIOR APPROACH;  Surgeon: [name], MD;  Location: [place];  Service: Orthopedics;  Laterality: Right;   TUBAL LIGATION     Current  Outpatient Medications on File Prior to Visit  Medication Sig Dispense Refill   Accu-Chek FastClix Lancets MISC Use to check FBS daily DX: E11.9 100 each 3   ACCU-CHEK GUIDE test strip USE TO CHECK FBS DAILY DX: E11.9 100 strip 12   albuterol (VENTOLIN HFA) 108 (90 Base) MCG/ACT inhaler Inhale 2 puffs into the lungs every 6 (six) hours as needed for wheezing or shortness of breath. 2 each 3   aspirin EC 325 MG tablet Take 1 tablet (325 mg total) by mouth 2 (two) times daily. 84 tablet 0   blood glucose meter kit and supplies Dispense based on patient and insurance preference. Use up to 1 time daily as directed. (FOR ICD-10 E10.9, E11.9). 1 each 0   Blood Glucose Monitoring Suppl (ACCU-CHEK AVIVA PLUS) w/Device KIT Use to check FBS daily DX: E11.9 1 kit 1   budesonide-formoterol (SYMBICORT) 160-4.5 MCG/ACT inhaler Inhale 2 puffs into the lungs 2 (two) times daily. 2 each 3   calcium gluconate 500 MG tablet Take 1 tablet (500 mg total) by mouth 3 (three) times daily. 30 tablet 11   Continuous Blood Gluc Receiver DEVI 1 Device by Does not apply route daily. Please dispense based on patient and insurance preference. Use as directed to monitor blood glucose levels. Dx: E11.9. 1 each  1   Continuous Blood Gluc Transmit MISC 1 each by Does not apply route daily. Please dispense based on patient and insurance preference. Use as directed to monitor blood glucose levels. Dx: E11.9. 2 each 11   [employer] D3 2000 units CAPS TAKE 2 CAPSULES BY MOUTH ONCE A DAY 60 capsule 9   estradiol (ESTRACE) 0.5 MG tablet TAKE 1 TABLET BY MOUTH EVERY DAY 90 tablet 0   fluticasone (FLONASE) 50 MCG/ACT nasal spray SPRAY 2 SPRAYS INTO EACH NOSTRIL EVERY DAY 48 mL 2   hydrochlorothiazide (HYDRODIURIL) 25 MG tablet TAKE 1 TABLET BY MOUTH EVERY DAY 90 tablet 2   HYDROcodone-homatropine (HYCODAN) 5-1.5 MG/5ML syrup Take 5 mLs by mouth every 8 (eight) hours as needed for cough. 120 mL 0   Lancets Misc. (ACCU-CHEK FASTCLIX LANCET) KIT Use to  check FBS daily DX: E11.9 1 kit 1   levocetirizine (XYZAL) 5 MG tablet TAKE 1 TABLET BY MOUTH EVERY DAY IN THE EVENING 90 tablet 1   losartan (COZAAR) 100 MG tablet TAKE 1 TABLET BY MOUTH EVERY DAY 90 tablet 3   metFORMIN (GLUCOPHAGE) 1000 MG tablet TAKE 1 TABLET (1,000 MG TOTAL) BY MOUTH 2 (TWO) TIMES DAILY WITH A MEAL. 180 tablet 1   omeprazole (PRILOSEC) 20 MG capsule TAKE 1 CAPSULE BY MOUTH EVERY DAY 90 capsule 3   No current facility-administered medications on file prior to visit.   Allergies  Allergen Reactions   Prevalite [Cholestyramine Light] Hives   Ace Inhibitors Rash   Social History   Socioeconomic History   Marital status: Married    Spouse name: Not on file   Number of children: Not on file   Years of education: Not on file   Highest education level: Not on file  Occupational History   Not on file  Tobacco Use   Smoking status: Former    Packs/day: 1.00    Types: Cigarettes    Start date: 01/29/1965    Quit date: 10/30/2010    Years since quitting: 10.0   Smokeless tobacco: Never  Substance and Sexual Activity   Alcohol use: No    Alcohol/week: 0.0 standard drinks   Drug use: No   Sexual activity: Not on file  Other Topics Concern   Not on file  Social History Narrative   Entered 07/2013:   Is retired. 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   Married.   Social Determinants of Health   Financial Resource Strain: Low Risk    Difficulty of Paying Living Expenses: Not hard at all  Food Insecurity: No Food Insecurity   Worried About Charity fundraiser in the Last Year: Never true   [place] in the Last Year: Never true  Transportation Needs: No Transportation Needs   Lack of Transportation (Medical): No   Lack of Transportation (Non-Medical): No  Physical Activity: Inactive   Days of Exercise per Week: 0 days   Minutes of Exercise per Session: 0 min  Stress: No Stress Concern Present   Feeling of Stress : Not at all  Social Connections: Moderately  Isolated   Frequency of Communication with Friends and Family: More than three times a week   Frequency of Social Gatherings with Friends and Family: More than three times a week   Attends Religious Services: Never   Marine scientist or Organizations: No   Attends Archivist Meetings: Never   Marital Status: Married  Human resources officer Violence: Not At Risk   Fear  of Current or Ex-Partner: No   Emotionally Abused: No   Physically Abused: No   Sexually Abused: No     Review of Systems  All other systems reviewed and are negative.     Objective:   Physical Exam Constitutional:      General: She is not in acute distress.    Appearance: Normal appearance. She is not ill-appearing, toxic-appearing or diaphoretic.  HENT:     Nose: Congestion present.  Cardiovascular:     Rate and Rhythm: Normal rate and regular rhythm.     Heart sounds: Normal heart sounds. No murmur heard. Pulmonary:     Effort: Pulmonary effort is normal. No respiratory distress.     Breath sounds: Normal breath sounds. No stridor. No wheezing, rhonchi or rales.  Chest:     Chest wall: No tenderness.  Abdominal:     General: Abdomen is flat. Bowel sounds are normal. There is no distension.     Palpations: Abdomen is soft.     Tenderness: There is no abdominal tenderness. There is no right CVA tenderness or guarding.  Neurological:     Mental Status: She is alert.          Assessment & Plan:  Diabetes mellitus type 2 with complications ([place]) - Plan: CBC with Differential/Platelet, COMPLETE METABOLIC PANEL WITH GFR, Lipid panel, Hemoglobin A1c  Type 2 diabetes mellitus with diabetic neuropathy, without long-term current use of insulin (H[facility])  Ptosis of eyelid, unspecified laterality  Recheck hemoglobin A1c.  If hemoglobin A1c is elevated and she cannot tolerate metformin at a higher dose, I would recommend switching to Trulicity.  Await the results of her CBC CMP lipid panel and A1c.   Ideally I would like to see her LDL cholesterol below 100.  We will start gabapentin 100 mg p.o. nightly.  She can use this up to every 8 hours as needed for nerve pain.  If she tolerates this dose we can certainly try to increase it if necessary.  I will draft a letter on her behalf stating my medical opinion regarding her upcoming eyelid surgery

## 2020-12-01 ENCOUNTER — Telehealth: Payer: Self-pay

## 2020-12-01 DIAGNOSIS — H02409 Unspecified ptosis of unspecified eyelid: Secondary | ICD-10-CM

## 2020-12-01 NOTE — Telephone Encounter (Signed)
While trying to fax a letter from pcp to [facility], I spoke with Ginger there at that office who stated that all this pt needs is a referral  sent to there office. The referral has to list this office [facility]. Please advise

## 2020-12-02 NOTE — Telephone Encounter (Signed)
Referral orders placed

## 2021-01-23 ENCOUNTER — Other Ambulatory Visit: Payer: Self-pay | Admitting: Family Medicine

## 2021-01-24 MED ORDER — BUDESONIDE-FORMOTEROL FUMARATE 160-4.5 MCG/ACT IN AERO: 2.0000 | INHALATION_SPRAY | Freq: Two times a day (BID) | RESPIRATORY_TRACT | 3 refills | Status: DC

## 2021-01-24 MED ORDER — ESTRADIOL 0.5 MG PO TABS
0.5000 mg | ORAL_TABLET | Freq: Every day | ORAL | 1 refills | Status: DC
Start: 2021-01-24 — End: 2021-07-24

## 2021-01-24 NOTE — Addendum Note (Signed)
Addended by: Jaynie Crumble on: 01/24/2021 12:59 PM   Modules accepted: Orders

## 2021-01-24 NOTE — Addendum Note (Signed)
Addended by: Jaynie Crumble on: 01/24/2021 12:57 PM   Modules accepted: Orders

## 2021-02-10 ENCOUNTER — Other Ambulatory Visit: Payer: Self-pay

## 2021-02-10 ENCOUNTER — Telehealth: Payer: Self-pay

## 2021-02-10 DIAGNOSIS — J449 Chronic obstructive pulmonary disease, unspecified: Secondary | ICD-10-CM

## 2021-02-10 MED ORDER — BUDESONIDE-FORMOTEROL FUMARATE 160-4.5 MCG/ACT IN AERO: 2.0000 | INHALATION_SPRAY | Freq: Two times a day (BID) | RESPIRATORY_TRACT | 3 refills | Status: DC

## 2021-02-10 NOTE — Telephone Encounter (Signed)
Pt called to advise she has obtained pt assistance for her Symbicort via [employer]. Pt states she needs a signed rx faxed to them.  Rx printed and faxed to [facility] [phone]

## 2021-02-22 IMAGING — CT CT RENAL STONE PROTOCOL
1 of 2 series · 13 of 32 positions shown, 18 images · non-contrast
Comparison: CT abdomen pelvis-05/28/2007

CLINICAL DATA: Right-sided flank pain for the past 3 weeks with
hematuria. Evaluate for nephrolithiasis.

EXAM:
CT ABDOMEN AND PELVIS WITHOUT CONTRAST
TECHNIQUE: Multidetector CT imaging of the abdomen and pelvis was performed
following the standard protocol without IV contrast.

[Series 2: renal standard/full · axial · 0.88mm/px · z∈[-465,-75]mm · 13 of 88 slices shown, 18 images]
[im 5/88  soft-tissue]
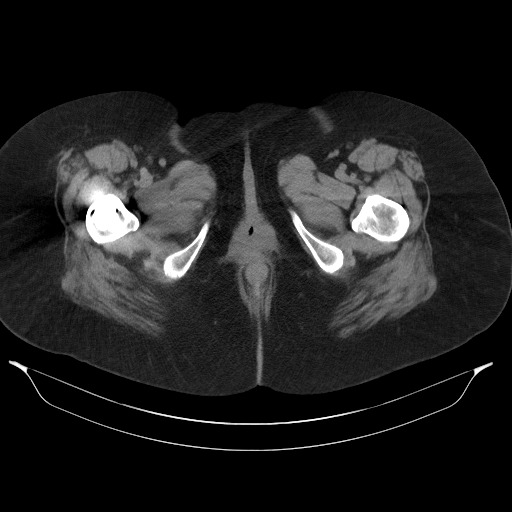
[im 5/88  bone]
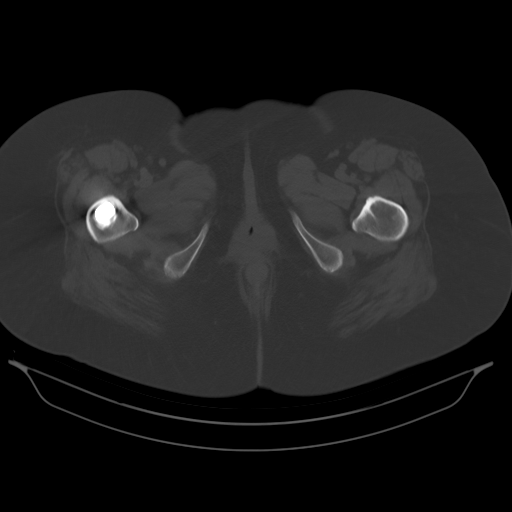
[im 14/88  soft-tissue]
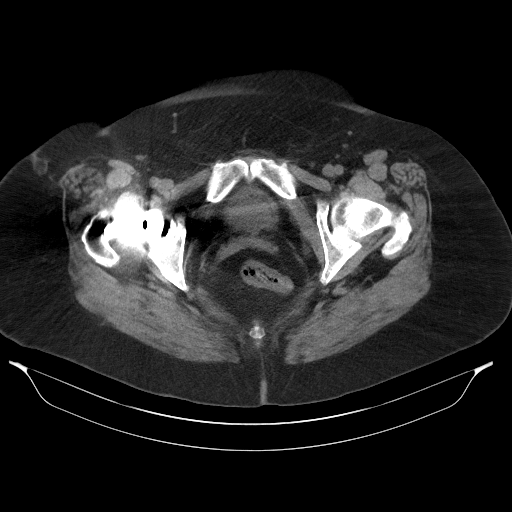
[im 18/88  soft-tissue]
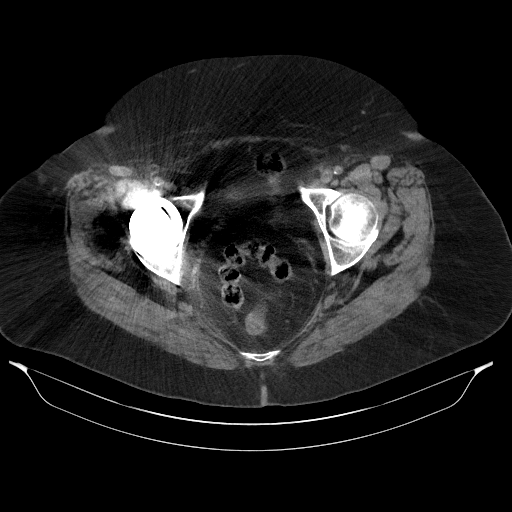
[im 27/88  soft-tissue]
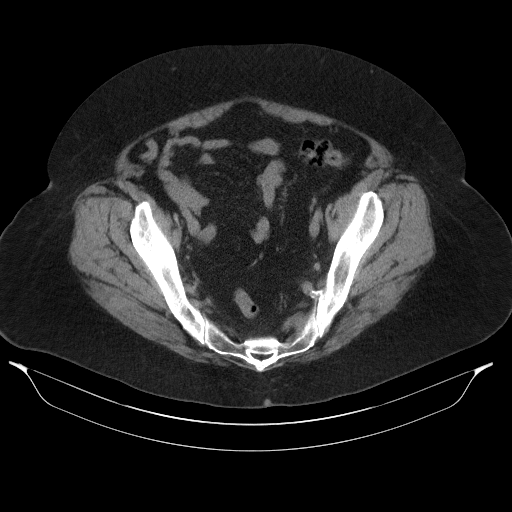
[im 35/88  soft-tissue]
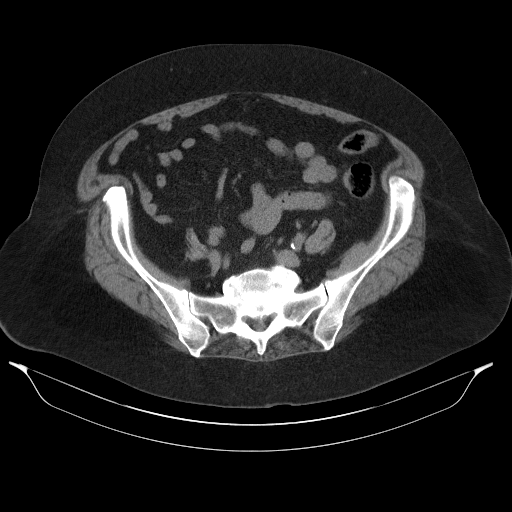
[im 40/88  soft-tissue]
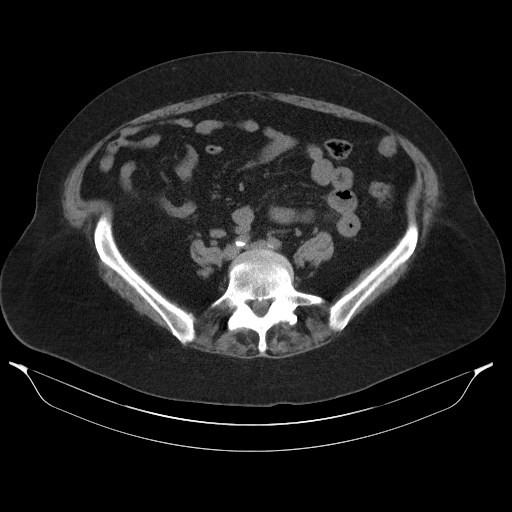
[im 48/88  soft-tissue]
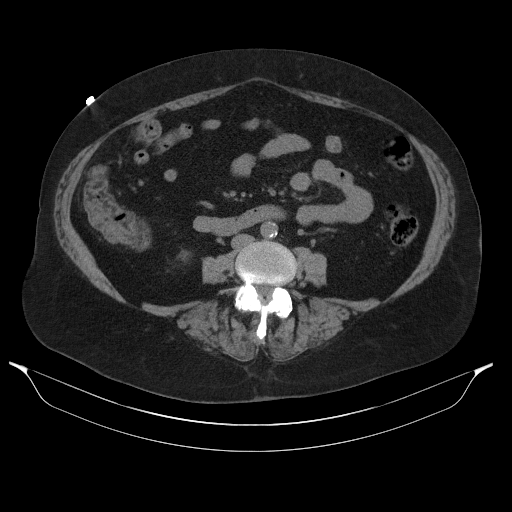
[im 53/88  soft-tissue]
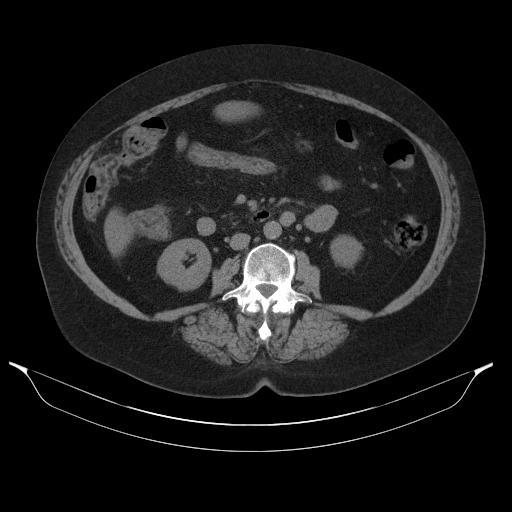
[im 61/88  soft-tissue]
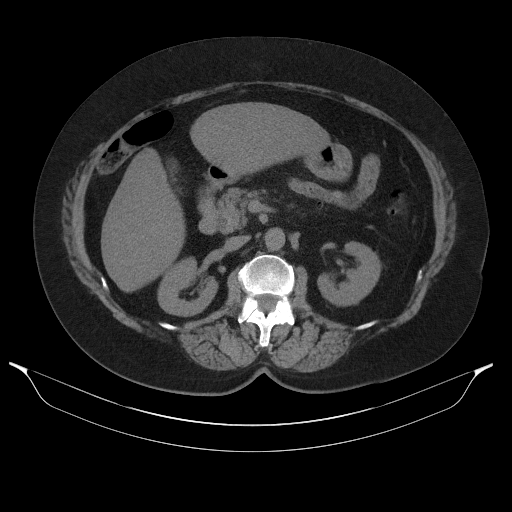
[im 61/88  bone]
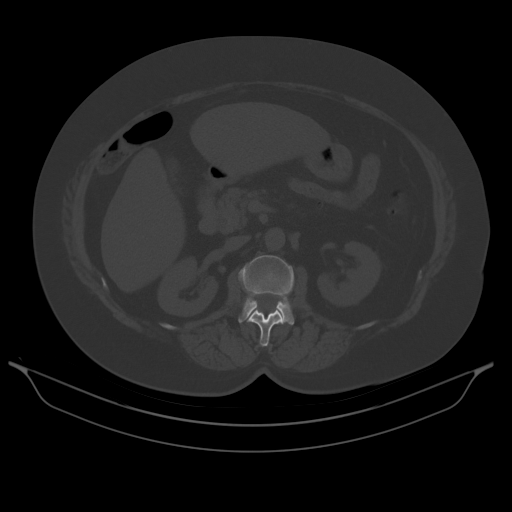
[im 70/88  soft-tissue]
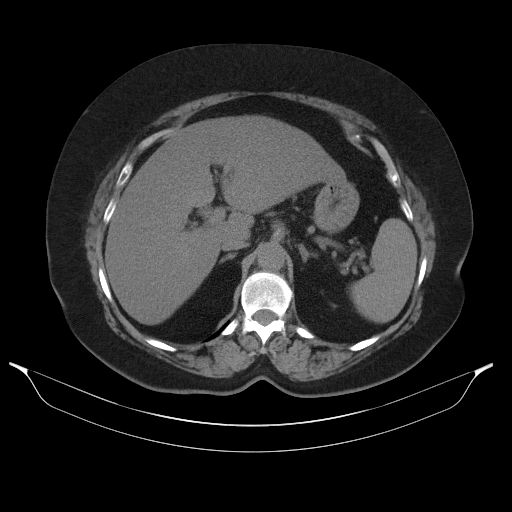
[im 70/88  lung]
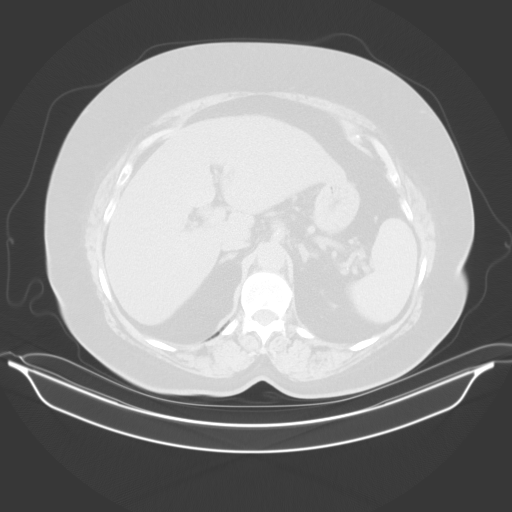
[im 74/88  soft-tissue]
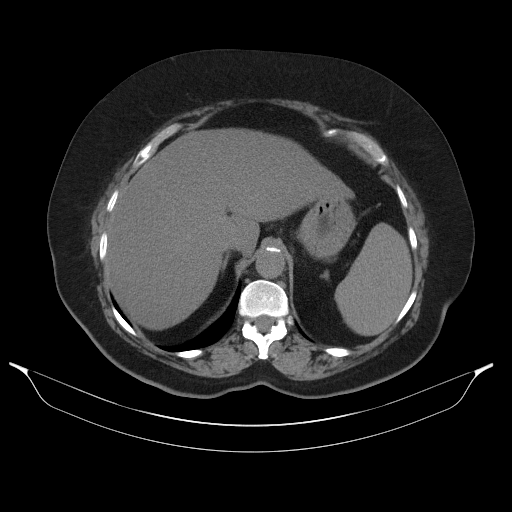
[im 74/88  lung]
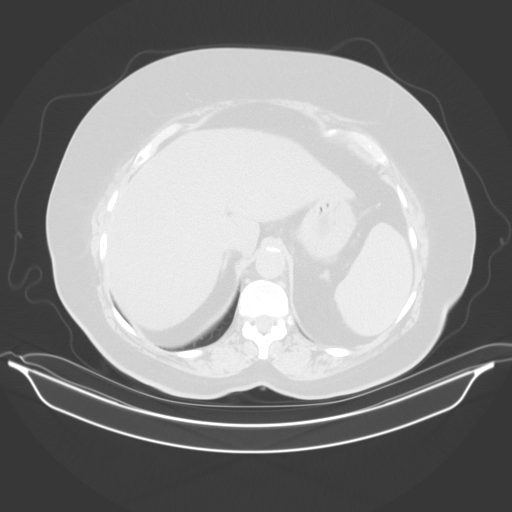
[im 79/88  lung]
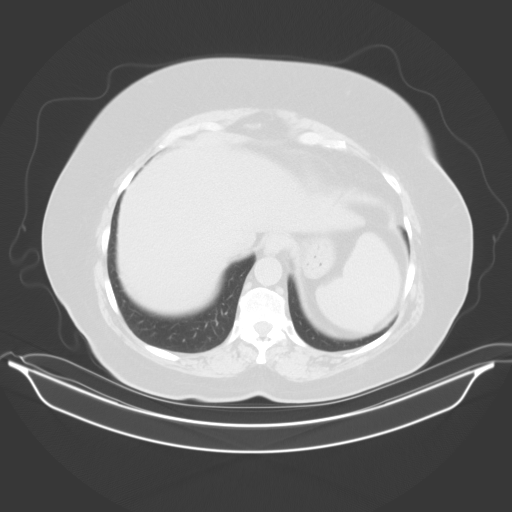
[im 83/88  soft-tissue]
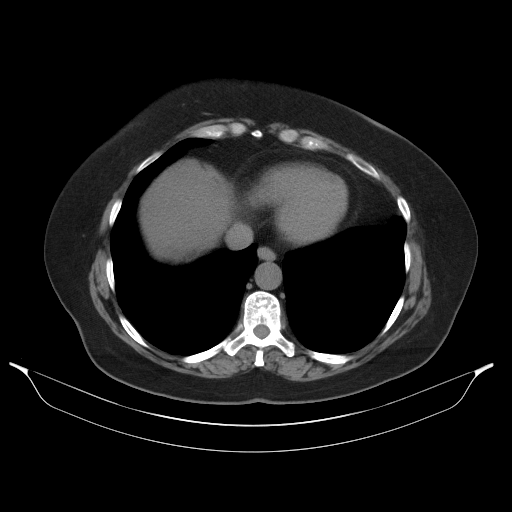
[im 83/88  lung]
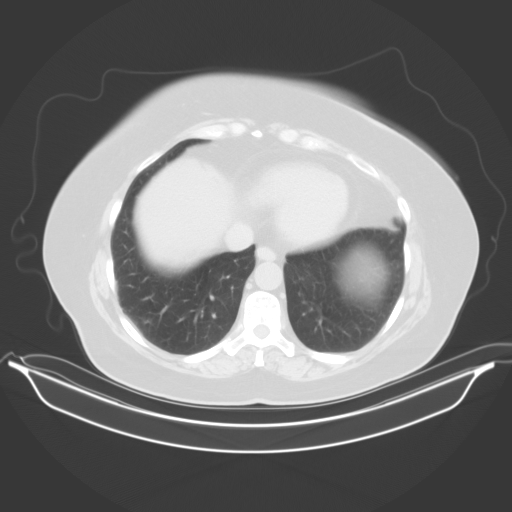

[13 of 32 positions shown; findings below may reference images not displayed]

FINDINGS: The lack of intravenous contrast limits the ability to evaluate
solid abdominal organs.

Lower chest: Limited visualization of the lower thorax demonstrates
minimal subsegmental atelectasis within the imaged left lower lobe.
No discrete focal airspace opacities. No pleural effusion.

Normal heart size.  No pericardial effusion.

Hepatobiliary: Mild nodularity hepatic contour. There is diffuse
decreased attenuation of the hepatic parenchyma suggestive of
hepatic steatosis. Post cholecystectomy. Common bile duct appears
mildly dilated measuring 1.3 cm in diameter (coronal image 29,
series 4), likely the sequela of post cholecystectomy state and
biliary reservoir phenomena. No ascites.

Pancreas: Normal noncontrast appearance of the pancreas.

Spleen: Normal noncontrast appearance of the spleen.

Adrenals/Urinary Tract: Normal noncontrast appearance of the
bilateral kidneys. No renal stones. No renal stones are seen along
the expected course of either ureter or the urinary bladder. Normal
noncontrast appearance of the urinary bladder given underdistention.
No urinary obstruction or perinephric stranding.

Normal noncontrast appearance the bilateral adrenal glands.

Stomach/Bowel: Moderate colonic stool burden without evidence of
enteric obstruction. Scattered colonic diverticulosis without
evidence of superimposed acute diverticulitis on this noncontrast
examination. The cecum is noted to be located within the right upper
abdominal quadrant. Normal noncontrast appearance of the terminal
ileum. The appendix is not visualized compatible with provided
operative history. No pneumoperitoneum, pneumatosis or portal venous
gas.

Vascular/Lymphatic: Atherosclerotic plaque within a normal caliber
abdominal aorta.

No bulky retroperitoneal, mesenteric, pelvic or inguinal
lymphadenopathy on this noncontrast examination.

Reproductive: Post hysterectomy. No discrete adnexal lesion. No free
fluid the pelvic cul-de-sac.

Other: Small mesenteric fat containing periumbilical hernia.

Musculoskeletal: No acute or aggressive osseous abnormalities.
Mild-to-moderate multilevel lumbar spine DDD, worse at L4-L5 and
L5-S1 with disc space height loss, endplate irregularity and
sclerosis. Post right total hip replacement, incompletely evaluated.
IMPRESSION: 1. No definite explanation for patient's right-sided flank pain.
Specifically, no evidence of nephrolithiasis or urinary obstruction.
2. Scattered colonic diverticulosis without evidence of superimposed
acute diverticulitis on this noncontrast examination.
3. Post appendectomy and cholecystectomy.
4. Suspected hepatic steatosis.  Correlation with LFTs is advised.
5.  Aortic Atherosclerosis ([id number]).

## 2021-03-03 ENCOUNTER — Inpatient Hospital Stay: Admission: RE | Admit: 2021-03-03 | Payer: Medicare HMO | Source: Ambulatory Visit

## 2021-03-17 ENCOUNTER — Institutional Professional Consult (permissible substitution): Payer: Medicare HMO | Admitting: Plastic Surgery

## 2021-04-29 ENCOUNTER — Other Ambulatory Visit: Payer: Self-pay | Admitting: Family Medicine

## 2021-04-29 DIAGNOSIS — K219 Gastro-esophageal reflux disease without esophagitis: Secondary | ICD-10-CM

## 2021-04-29 DIAGNOSIS — I10 Essential (primary) hypertension: Secondary | ICD-10-CM

## 2021-06-01 ENCOUNTER — Ambulatory Visit: Payer: Medicare HMO

## 2021-06-01 NOTE — Patient Instructions (Incomplete)
Amanda Pope , ?Thank you for taking time to come for your Medicare Wellness Visit. I appreciate your ongoing commitment to your health goals. Please review the following plan we discussed and let me know if I can assist you in the future.  ? ?Screening recommendations/referrals: ?Colonoscopy: Done 09/12/2020 Repeat in 10 years ? ?Mammogram: 08/25/2020 Repeat annually ? ?Bone Density: Done 11/15/2015 Repeat every 2 years ? ?Recommended yearly ophthalmology/optometry visit for glaucoma screening and checkup ?Recommended yearly dental visit for hygiene and checkup ? ?Vaccinations: ?Influenza vaccine: Done 11/21/2017 ?Pneumococcal vaccine: Done 08/27/2013 and 08/25/2015 ?Tdap vaccine: Done 08/27/2013 Repeat in 10 years ? ?Shingles vaccine:    ?Covid-19: ? ?Advanced directives: *** ? ?Conditions/risks identified: *** ? ?Next appointment: Follow up in one year for your annual wellness visit *** ? ? ?Preventive Care 47 Years and Older, Female ?Preventive care refers to lifestyle choices and visits with your health care provider that can promote health and wellness. ?What does preventive care include? ?A yearly physical exam. This is also called an annual well check. ?Dental exams once or twice a year. ?Routine eye exams. Ask your health care provider how often you should have your eyes checked. ?Personal lifestyle choices, including: ?Daily care of your teeth and gums. ?Regular physical activity. ?Eating a healthy diet. ?Avoiding tobacco and drug use. ?Limiting alcohol use. ?Practicing safe sex. ?Taking low-dose aspirin every day. ?Taking vitamin and mineral supplements as recommended by your health care provider. ?What happens during an annual well check? ?The services and screenings done by your health care provider during your annual well check will depend on your age, overall health, lifestyle risk factors, and family history of disease. ?Counseling  ?Your health care provider may ask you questions about your: ?Alcohol  use. ?Tobacco use. ?Drug use. ?Emotional well-being. ?Home and relationship well-being. ?Sexual activity. ?Eating habits. ?History of falls. ?Memory and ability to understand (cognition). ?Work and work Statistician. ?Reproductive health. ?Screening  ?You may have the following tests or measurements: ?Height, weight, and BMI. ?Blood pressure. ?Lipid and cholesterol levels. These may be checked every 5 years, or more frequently if you are over 73 years old. ?Skin check. ?Lung cancer screening. You may have this screening every year starting at age 60 if you have a 30-pack-year history of smoking and currently smoke or have quit within the past 15 years. ?Fecal occult blood test (FOBT) of the stool. You may have this test every year starting at age 97. ?Flexible sigmoidoscopy or colonoscopy. You may have a sigmoidoscopy every 5 years or a colonoscopy every 10 years starting at age 34. ?Hepatitis C blood test. ?Hepatitis B blood test. ?Sexually transmitted disease (STD) testing. ?Diabetes screening. This is done by checking your blood sugar (glucose) after you have not eaten for a while (fasting). You may have this done every 1-3 years. ?Bone density scan. This is done to screen for osteoporosis. You may have this done starting at age 54. ?Mammogram. This may be done every 1-2 years. Talk to your health care provider about how often you should have regular mammograms. ?Talk with your health care provider about your test results, treatment options, and if necessary, the need for more tests. ?Vaccines  ?Your health care provider may recommend certain vaccines, such as: ?Influenza vaccine. This is recommended every year. ?Tetanus, diphtheria, and acellular pertussis (Tdap, Td) vaccine. You may need a Td booster every 10 years. ?Zoster vaccine. You may need this after age 46. ?Pneumococcal 13-valent conjugate (PCV13) vaccine. One dose is recommended after  age 18. ?Pneumococcal polysaccharide (PPSV23) vaccine. One dose is  recommended after age 60. ?Talk to your health care provider about which screenings and vaccines you need and how often you need them. ?This information is not intended to replace advice given to you by your health care provider. Make sure you discuss any questions you have with your health care provider. ?Document Released: 02/11/2015 Document Revised: 10/05/2015 Document Reviewed: 11/16/2014 ?Elsevier Interactive Patient Education ? 2017 [place]. ? ?Fall Prevention in the Home ?Falls can cause injuries. They can happen to people of all ages. There are many things you can do to make your home safe and to help prevent falls. ?What can I do on the outside of my home? ?Regularly fix the edges of walkways and driveways and fix any cracks. ?Remove anything that might make you trip as you walk through a door, such as a raised step or threshold. ?Trim any bushes or trees on the path to your home. ?Use bright outdoor lighting. ?Clear any walking paths of anything that might make someone trip, such as rocks or tools. ?Regularly check to see if handrails are loose or broken. Make sure that both sides of any steps have handrails. ?Any raised decks and porches should have guardrails on the edges. ?Have any leaves, snow, or ice cleared regularly. ?Use sand or salt on walking paths during winter. ?Clean up any spills in your garage right away. This includes oil or grease spills. ?What can I do in the bathroom? ?Use night lights. ?Install grab bars by the toilet and in the tub and shower. Do not use towel bars as grab bars. ?Use non-skid mats or decals in the tub or shower. ?If you need to sit down in the shower, use a plastic, non-slip stool. ?Keep the floor dry. Clean up any water that spills on the floor as soon as it happens. ?Remove soap buildup in the tub or shower regularly. ?Attach bath mats securely with double-sided non-slip rug tape. ?Do not have throw rugs and other things on the floor that can make you  trip. ?What can I do in the bedroom? ?Use night lights. ?Make sure that you have a light by your bed that is easy to reach. ?Do not use any sheets or blankets that are too big for your bed. They should not hang down onto the floor. ?Have a firm chair that has side arms. You can use this for support while you get dressed. ?Do not have throw rugs and other things on the floor that can make you trip. ?What can I do in the kitchen? ?Clean up any spills right away. ?Avoid walking on wet floors. ?Keep items that you use a lot in easy-to-reach places. ?If you need to reach something above you, use a strong step stool that has a grab bar. ?Keep electrical cords out of the way. ?Do not use floor polish or wax that makes floors slippery. If you must use wax, use non-skid floor wax. ?Do not have throw rugs and other things on the floor that can make you trip. ?What can I do with my stairs? ?Do not leave any items on the stairs. ?Make sure that there are handrails on both sides of the stairs and use them. Fix handrails that are broken or loose. Make sure that handrails are as long as the stairways. ?Check any carpeting to make sure that it is firmly attached to the stairs. Fix any carpet that is loose or worn. ?Avoid having throw rugs  at the top or bottom of the stairs. If you do have throw rugs, attach them to the floor with carpet tape. ?Make sure that you have a light switch at the top of the stairs and the bottom of the stairs. If you do not have them, ask someone to add them for you. ?What else can I do to help prevent falls? ?Wear shoes that: ?Do not have high heels. ?Have rubber bottoms. ?Are comfortable and fit you well. ?Are closed at the toe. Do not wear sandals. ?If you use a stepladder: ?Make sure that it is fully opened. Do not climb a closed stepladder. ?Make sure that both sides of the stepladder are locked into place. ?Ask someone to hold it for you, if possible. ?Clearly mark and make sure that you can  see: ?Any grab bars or handrails. ?First and last steps. ?Where the edge of each step is. ?Use tools that help you move around (mobility aids) if they are needed. These include: ?Canes. ?Walkers. ?Scooters. ?Crutches. ?Turn on

## 2021-06-13 ENCOUNTER — Telehealth: Payer: Self-pay | Admitting: Family Medicine

## 2021-06-13 NOTE — Telephone Encounter (Signed)
Left message for patient to call back and schedule Medicare Annual Wellness Visit (AWV) in office.  ? ?If not able to come in office, please offer to do virtually or by telephone.  Left office number and my jabber [phone]. ? ?Last AWV:05/27/2020 ? ?Please schedule at anytime with Nurse Health Advisor. ?  ?

## 2021-06-14 ENCOUNTER — Other Ambulatory Visit: Payer: Self-pay | Admitting: Family Medicine

## 2021-06-14 NOTE — Telephone Encounter (Signed)
Requested medication (s) are due for refill today: Yes ? ?Requested medication (s) are on the active medication list: Yes ? ?Last refill:  04/20/21 ? ?Future visit scheduled: No ? ?Notes to clinic:  Prescription expired. ? ? ? ?Requested Prescriptions  ?Pending Prescriptions Disp Refills  ? fluticasone (FLONASE) 50 MCG/ACT nasal spray [Pharmacy Med Name: FLUTICASONE PROP 50 MCG SPRAY] 48 mL 2  ?  Sig: SPRAY 2 SPRAYS INTO EACH NOSTRIL EVERY DAY  ?  ? Ear, Nose, and Throat: Nasal Preparations - Corticosteroids Passed - 06/14/2021  9:34 AM  ?  ?  Passed - Valid encounter within last 12 months  ?  Recent Outpatient Visits   ? ?      ? 6 months ago Diabetes mellitus type 2 with complications ([place])  ? [facility], Cammie Mcgee, MD  ? 1 year ago Diabetes mellitus type 2 with complications [facility])  ? [facility], Cammie Mcgee, MD  ? 2 years ago Elevated liver enzymes  ? [facility], Cammie Mcgee, MD  ? 2 years ago Subclinical hypothyroidism  ? [facility], Cammie Mcgee, MD  ? 2 years ago Right-sided low back pain without sciatica, unspecified chronicity  ? [facility], Cammie Mcgee, MD  ? ?  ?  ? ? ?  ?  ?  ? ?

## 2021-07-23 ENCOUNTER — Other Ambulatory Visit: Payer: Self-pay | Admitting: Family Medicine

## 2021-08-21 ENCOUNTER — Other Ambulatory Visit: Payer: Self-pay | Admitting: Family Medicine

## 2021-09-01 DIAGNOSIS — Z0100 Encounter for examination of eyes and vision without abnormal findings: Secondary | ICD-10-CM | POA: Diagnosis not present

## 2021-09-01 DIAGNOSIS — H524 Presbyopia: Secondary | ICD-10-CM | POA: Diagnosis not present

## 2021-10-04 DIAGNOSIS — M9901 Segmental and somatic dysfunction of cervical region: Secondary | ICD-10-CM | POA: Diagnosis not present

## 2021-10-04 DIAGNOSIS — M542 Cervicalgia: Secondary | ICD-10-CM | POA: Diagnosis not present

## 2021-10-04 DIAGNOSIS — M9903 Segmental and somatic dysfunction of lumbar region: Secondary | ICD-10-CM | POA: Diagnosis not present

## 2021-10-04 DIAGNOSIS — M5416 Radiculopathy, lumbar region: Secondary | ICD-10-CM | POA: Diagnosis not present

## 2021-10-05 DIAGNOSIS — M542 Cervicalgia: Secondary | ICD-10-CM | POA: Diagnosis not present

## 2021-10-05 DIAGNOSIS — M5416 Radiculopathy, lumbar region: Secondary | ICD-10-CM | POA: Diagnosis not present

## 2021-10-05 DIAGNOSIS — M9903 Segmental and somatic dysfunction of lumbar region: Secondary | ICD-10-CM | POA: Diagnosis not present

## 2021-10-05 DIAGNOSIS — M9901 Segmental and somatic dysfunction of cervical region: Secondary | ICD-10-CM | POA: Diagnosis not present

## 2021-10-09 DIAGNOSIS — M5416 Radiculopathy, lumbar region: Secondary | ICD-10-CM | POA: Diagnosis not present

## 2021-10-09 DIAGNOSIS — M9903 Segmental and somatic dysfunction of lumbar region: Secondary | ICD-10-CM | POA: Diagnosis not present

## 2021-10-09 DIAGNOSIS — M542 Cervicalgia: Secondary | ICD-10-CM | POA: Diagnosis not present

## 2021-10-09 DIAGNOSIS — M9901 Segmental and somatic dysfunction of cervical region: Secondary | ICD-10-CM | POA: Diagnosis not present

## 2021-10-11 DIAGNOSIS — M9901 Segmental and somatic dysfunction of cervical region: Secondary | ICD-10-CM | POA: Diagnosis not present

## 2021-10-11 DIAGNOSIS — M9903 Segmental and somatic dysfunction of lumbar region: Secondary | ICD-10-CM | POA: Diagnosis not present

## 2021-10-11 DIAGNOSIS — M5416 Radiculopathy, lumbar region: Secondary | ICD-10-CM | POA: Diagnosis not present

## 2021-10-11 DIAGNOSIS — M542 Cervicalgia: Secondary | ICD-10-CM | POA: Diagnosis not present

## 2021-10-12 DIAGNOSIS — M542 Cervicalgia: Secondary | ICD-10-CM | POA: Diagnosis not present

## 2021-10-12 DIAGNOSIS — M9903 Segmental and somatic dysfunction of lumbar region: Secondary | ICD-10-CM | POA: Diagnosis not present

## 2021-10-12 DIAGNOSIS — M5416 Radiculopathy, lumbar region: Secondary | ICD-10-CM | POA: Diagnosis not present

## 2021-10-12 DIAGNOSIS — M9901 Segmental and somatic dysfunction of cervical region: Secondary | ICD-10-CM | POA: Diagnosis not present

## 2021-10-16 DIAGNOSIS — M542 Cervicalgia: Secondary | ICD-10-CM | POA: Diagnosis not present

## 2021-10-16 DIAGNOSIS — M9903 Segmental and somatic dysfunction of lumbar region: Secondary | ICD-10-CM | POA: Diagnosis not present

## 2021-10-16 DIAGNOSIS — M9901 Segmental and somatic dysfunction of cervical region: Secondary | ICD-10-CM | POA: Diagnosis not present

## 2021-10-16 DIAGNOSIS — M5416 Radiculopathy, lumbar region: Secondary | ICD-10-CM | POA: Diagnosis not present

## 2021-10-18 DIAGNOSIS — M9903 Segmental and somatic dysfunction of lumbar region: Secondary | ICD-10-CM | POA: Diagnosis not present

## 2021-10-18 DIAGNOSIS — M542 Cervicalgia: Secondary | ICD-10-CM | POA: Diagnosis not present

## 2021-10-18 DIAGNOSIS — M9901 Segmental and somatic dysfunction of cervical region: Secondary | ICD-10-CM | POA: Diagnosis not present

## 2021-10-18 DIAGNOSIS — M5416 Radiculopathy, lumbar region: Secondary | ICD-10-CM | POA: Diagnosis not present

## 2021-10-20 DIAGNOSIS — M542 Cervicalgia: Secondary | ICD-10-CM | POA: Diagnosis not present

## 2021-10-20 DIAGNOSIS — M9901 Segmental and somatic dysfunction of cervical region: Secondary | ICD-10-CM | POA: Diagnosis not present

## 2021-10-20 DIAGNOSIS — M9903 Segmental and somatic dysfunction of lumbar region: Secondary | ICD-10-CM | POA: Diagnosis not present

## 2021-10-20 DIAGNOSIS — M5416 Radiculopathy, lumbar region: Secondary | ICD-10-CM | POA: Diagnosis not present

## 2021-10-24 DIAGNOSIS — M5416 Radiculopathy, lumbar region: Secondary | ICD-10-CM | POA: Diagnosis not present

## 2021-10-24 DIAGNOSIS — M9901 Segmental and somatic dysfunction of cervical region: Secondary | ICD-10-CM | POA: Diagnosis not present

## 2021-10-24 DIAGNOSIS — M542 Cervicalgia: Secondary | ICD-10-CM | POA: Diagnosis not present

## 2021-10-24 DIAGNOSIS — M9903 Segmental and somatic dysfunction of lumbar region: Secondary | ICD-10-CM | POA: Diagnosis not present

## 2021-10-26 DIAGNOSIS — M9901 Segmental and somatic dysfunction of cervical region: Secondary | ICD-10-CM | POA: Diagnosis not present

## 2021-10-26 DIAGNOSIS — M542 Cervicalgia: Secondary | ICD-10-CM | POA: Diagnosis not present

## 2021-10-26 DIAGNOSIS — M5416 Radiculopathy, lumbar region: Secondary | ICD-10-CM | POA: Diagnosis not present

## 2021-10-26 DIAGNOSIS — M9903 Segmental and somatic dysfunction of lumbar region: Secondary | ICD-10-CM | POA: Diagnosis not present

## 2021-11-01 ENCOUNTER — Other Ambulatory Visit: Payer: Self-pay | Admitting: Family Medicine

## 2021-11-01 DIAGNOSIS — M9901 Segmental and somatic dysfunction of cervical region: Secondary | ICD-10-CM | POA: Diagnosis not present

## 2021-11-01 DIAGNOSIS — M542 Cervicalgia: Secondary | ICD-10-CM | POA: Diagnosis not present

## 2021-11-01 DIAGNOSIS — M9903 Segmental and somatic dysfunction of lumbar region: Secondary | ICD-10-CM | POA: Diagnosis not present

## 2021-11-01 DIAGNOSIS — M5416 Radiculopathy, lumbar region: Secondary | ICD-10-CM | POA: Diagnosis not present

## 2021-11-01 DIAGNOSIS — I10 Essential (primary) hypertension: Secondary | ICD-10-CM

## 2021-11-01 NOTE — Telephone Encounter (Signed)
Unable to refill per protocol, request is too soon. Last RF for estradiol was 07/24/21 for 90 and 1. Last RF for hydrodiuril 05/01/21 for 90 and 2 RF. Will refuse.  Requested Prescriptions  Pending Prescriptions Disp Refills  . estradiol (ESTRACE) 0.5 MG tablet [Pharmacy Med Name: ESTRADIOL 0.5 MG TABLET] 90 tablet 1    Sig: TAKE 1 TABLET BY MOUTH EVERY DAY     OB/GYN:  Estrogens Passed - 11/01/2021 10:30 AM      Passed - Mammogram is up-to-date per Health Maintenance      Passed - Last BP in normal range    BP Readings from Last 1 Encounters:  11/25/20 128/72         Passed - Valid encounter within last 12 months    Recent Outpatient Visits          11 months ago Diabetes mellitus type 2 with complications ([place])   [place] Pickard, Cammie Mcgee, MD   1 year ago Diabetes mellitus type 2 with complications ([place])   [place] [name], MD   2 years ago Elevated liver enzymes   [place] Dennard Schaumann, Cammie Mcgee, MD   2 years ago Subclinical hypothyroidism   [place] [name], MD   2 years ago Right-sided low back pain without sciatica, unspecified chronicity   [place] Pickard, Cammie Mcgee, MD             . hydrochlorothiazide (HYDRODIURIL) 25 MG tablet [Pharmacy Med Name: HYDROCHLOROTHIAZIDE 25 MG TAB] 90 tablet 2    Sig: TAKE 1 TABLET BY MOUTH EVERY DAY     Cardiovascular: Diuretics - Thiazide Failed - 11/01/2021 10:30 AM      Failed - Cr in normal range and within 180 days    Creat  Date Value Ref Range Status  11/25/2020 0.79 0.60 - 1.00 mg/dL Final         Failed - K in normal range and within 180 days    Potassium  Date Value Ref Range Status  11/25/2020 3.9 3.5 - 5.3 mmol/L Final         Failed - Na in normal range and within 180 days    Sodium  Date Value Ref Range Status  11/25/2020 142 135 - 146 mmol/L Final         Failed - Valid encounter within last 6  months    Recent Outpatient Visits          11 months ago Diabetes mellitus type 2 with complications ([place])   [place] [name], MD   1 year ago Diabetes mellitus type 2 with complications ([place])   [place] [name], MD   2 years ago Elevated liver enzymes   [place] Dennard Schaumann, Cammie Mcgee, MD   2 years ago Subclinical hypothyroidism   [place] [name], MD   2 years ago Right-sided low back pain without sciatica, unspecified chronicity   [place] Pickard, Cammie Mcgee, MD             Passed - Last BP in normal range    BP Readings from Last 1 Encounters:  11/25/20 128/72

## 2021-11-02 DIAGNOSIS — M5416 Radiculopathy, lumbar region: Secondary | ICD-10-CM | POA: Diagnosis not present

## 2021-11-02 DIAGNOSIS — M542 Cervicalgia: Secondary | ICD-10-CM | POA: Diagnosis not present

## 2021-11-02 DIAGNOSIS — M9903 Segmental and somatic dysfunction of lumbar region: Secondary | ICD-10-CM | POA: Diagnosis not present

## 2021-11-02 DIAGNOSIS — M9901 Segmental and somatic dysfunction of cervical region: Secondary | ICD-10-CM | POA: Diagnosis not present

## 2021-11-08 DIAGNOSIS — M9901 Segmental and somatic dysfunction of cervical region: Secondary | ICD-10-CM | POA: Diagnosis not present

## 2021-11-08 DIAGNOSIS — M9903 Segmental and somatic dysfunction of lumbar region: Secondary | ICD-10-CM | POA: Diagnosis not present

## 2021-11-08 DIAGNOSIS — M5416 Radiculopathy, lumbar region: Secondary | ICD-10-CM | POA: Diagnosis not present

## 2021-11-08 DIAGNOSIS — M542 Cervicalgia: Secondary | ICD-10-CM | POA: Diagnosis not present

## 2021-11-10 DIAGNOSIS — M542 Cervicalgia: Secondary | ICD-10-CM | POA: Diagnosis not present

## 2021-11-10 DIAGNOSIS — M9901 Segmental and somatic dysfunction of cervical region: Secondary | ICD-10-CM | POA: Diagnosis not present

## 2021-11-10 DIAGNOSIS — M5416 Radiculopathy, lumbar region: Secondary | ICD-10-CM | POA: Diagnosis not present

## 2021-11-10 DIAGNOSIS — M9903 Segmental and somatic dysfunction of lumbar region: Secondary | ICD-10-CM | POA: Diagnosis not present

## 2021-11-13 DIAGNOSIS — M542 Cervicalgia: Secondary | ICD-10-CM | POA: Diagnosis not present

## 2021-11-13 DIAGNOSIS — M5416 Radiculopathy, lumbar region: Secondary | ICD-10-CM | POA: Diagnosis not present

## 2021-11-13 DIAGNOSIS — M9903 Segmental and somatic dysfunction of lumbar region: Secondary | ICD-10-CM | POA: Diagnosis not present

## 2021-11-13 DIAGNOSIS — M9901 Segmental and somatic dysfunction of cervical region: Secondary | ICD-10-CM | POA: Diagnosis not present

## 2021-11-16 DIAGNOSIS — M5416 Radiculopathy, lumbar region: Secondary | ICD-10-CM | POA: Diagnosis not present

## 2021-11-16 DIAGNOSIS — M9903 Segmental and somatic dysfunction of lumbar region: Secondary | ICD-10-CM | POA: Diagnosis not present

## 2021-11-16 DIAGNOSIS — M542 Cervicalgia: Secondary | ICD-10-CM | POA: Diagnosis not present

## 2021-11-16 DIAGNOSIS — M9901 Segmental and somatic dysfunction of cervical region: Secondary | ICD-10-CM | POA: Diagnosis not present

## 2021-11-23 DIAGNOSIS — M542 Cervicalgia: Secondary | ICD-10-CM | POA: Diagnosis not present

## 2021-11-23 DIAGNOSIS — M9903 Segmental and somatic dysfunction of lumbar region: Secondary | ICD-10-CM | POA: Diagnosis not present

## 2021-11-23 DIAGNOSIS — M9901 Segmental and somatic dysfunction of cervical region: Secondary | ICD-10-CM | POA: Diagnosis not present

## 2021-11-23 DIAGNOSIS — M5416 Radiculopathy, lumbar region: Secondary | ICD-10-CM | POA: Diagnosis not present

## 2021-12-06 DIAGNOSIS — M9901 Segmental and somatic dysfunction of cervical region: Secondary | ICD-10-CM | POA: Diagnosis not present

## 2021-12-06 DIAGNOSIS — M5416 Radiculopathy, lumbar region: Secondary | ICD-10-CM | POA: Diagnosis not present

## 2021-12-06 DIAGNOSIS — M9903 Segmental and somatic dysfunction of lumbar region: Secondary | ICD-10-CM | POA: Diagnosis not present

## 2021-12-06 DIAGNOSIS — M542 Cervicalgia: Secondary | ICD-10-CM | POA: Diagnosis not present

## 2021-12-13 DIAGNOSIS — M542 Cervicalgia: Secondary | ICD-10-CM | POA: Diagnosis not present

## 2021-12-13 DIAGNOSIS — M9903 Segmental and somatic dysfunction of lumbar region: Secondary | ICD-10-CM | POA: Diagnosis not present

## 2021-12-13 DIAGNOSIS — M9901 Segmental and somatic dysfunction of cervical region: Secondary | ICD-10-CM | POA: Diagnosis not present

## 2021-12-13 DIAGNOSIS — M5416 Radiculopathy, lumbar region: Secondary | ICD-10-CM | POA: Diagnosis not present

## 2021-12-27 DIAGNOSIS — M9901 Segmental and somatic dysfunction of cervical region: Secondary | ICD-10-CM | POA: Diagnosis not present

## 2021-12-27 DIAGNOSIS — M5416 Radiculopathy, lumbar region: Secondary | ICD-10-CM | POA: Diagnosis not present

## 2021-12-27 DIAGNOSIS — M542 Cervicalgia: Secondary | ICD-10-CM | POA: Diagnosis not present

## 2021-12-27 DIAGNOSIS — M9903 Segmental and somatic dysfunction of lumbar region: Secondary | ICD-10-CM | POA: Diagnosis not present

## 2021-12-28 ENCOUNTER — Other Ambulatory Visit: Payer: Self-pay | Admitting: Family Medicine

## 2021-12-28 DIAGNOSIS — I10 Essential (primary) hypertension: Secondary | ICD-10-CM

## 2021-12-28 NOTE — Telephone Encounter (Signed)
Requested medication (s) are due for refill today: Yes  Requested medication (s) are on the active medication list: Yes  Last refill:  Estradiol 07/04/21; Gabapentin 11/25/20; HCTZ 05/01/20  Future visit scheduled: Yes  Notes to clinic:  Unable to refill per protocol due to failed labs, no updated results.      Requested Prescriptions  Pending Prescriptions Disp Refills   estradiol (ESTRACE) 0.5 MG tablet [Pharmacy Med Name: ESTRADIOL 0.5 MG TABLET] 90 tablet 1    Sig: TAKE 1 TABLET BY MOUTH EVERY DAY     OB/GYN:  Estrogens Failed - 12/28/2021 12:32 PM      Failed - Valid encounter within last 12 months    Recent Outpatient Visits           1 year ago Diabetes mellitus type 2 with complications ([place])   [place] [name], MD   2 years ago Diabetes mellitus type 2 with complications ([place])   [place] [name], MD   2 years ago Elevated liver enzymes   [place] Dennard Schaumann, Cammie Mcgee, MD   2 years ago Subclinical hypothyroidism   [place] [name], MD   2 years ago Right-sided low back pain without sciatica, unspecified chronicity   [place] Pickard, Cammie Mcgee, MD       Future Appointments             In 3 weeks Pickard, Cammie Mcgee, MD [place], [place] is up-to-date per Health Maintenance      Passed - Last BP in normal range    BP Readings from Last 1 Encounters:  11/25/20 128/72          gabapentin (NEURONTIN) 100 MG capsule [Pharmacy Med Name: GABAPENTIN 100 MG CAPSULE] 90 capsule 3    Sig: TAKE 1 CAPSULE BY MOUTH 3 TIMES DAILY AS NEEDED FOR PAIN     Neurology: Anticonvulsants - gabapentin Failed - 12/28/2021 12:32 PM      Failed - Cr in normal range and within 360 days    Creat  Date Value Ref Range Status  11/25/2020 0.79 0.60 - 1.00 mg/dL Final         Failed - Completed PHQ-2 or PHQ-9 in  the last 360 days      Failed - Valid encounter within last 12 months    Recent Outpatient Visits           1 year ago Diabetes mellitus type 2 with complications ([place])   [place] [name], MD   2 years ago Diabetes mellitus type 2 with complications ([place])   [place] Pickard, Cammie Mcgee, MD   2 years ago Elevated liver enzymes   [place] Dennard Schaumann, Cammie Mcgee, MD   2 years ago Subclinical hypothyroidism   [place], Warren T, MD   2 years ago Right-sided low back pain without sciatica, unspecified chronicity   [place] Pickard, Cammie Mcgee, MD       Future Appointments             In 3 weeks Pickard, Cammie Mcgee, MD [place]             hydrochlorothiazide (HYDRODIURIL) 25 MG tablet [Pharmacy Med Name: HYDROCHLOROTHIAZIDE 25 MG TAB] 90  tablet 2    Sig: TAKE 1 TABLET BY MOUTH EVERY DAY     Cardiovascular: Diuretics - Thiazide Failed - 12/28/2021 12:32 PM      Failed - Cr in normal range and within 180 days    Creat  Date Value Ref Range Status  11/25/2020 0.79 0.60 - 1.00 mg/dL Final         Failed - K in normal range and within 180 days    Potassium  Date Value Ref Range Status  11/25/2020 3.9 3.5 - 5.3 mmol/L Final         Failed - Na in normal range and within 180 days    Sodium  Date Value Ref Range Status  11/25/2020 142 135 - 146 mmol/L Final         Failed - Valid encounter within last 6 months    Recent Outpatient Visits           1 year ago Diabetes mellitus type 2 with complications ([place])   [place] [name], MD   2 years ago Diabetes mellitus type 2 with complications ([place])   [place] [name], MD   2 years ago Elevated liver enzymes   [place] Dennard Schaumann, Cammie Mcgee, MD   2 years ago Subclinical hypothyroidism   [place]  [name], MD   2 years ago Right-sided low back pain without sciatica, unspecified chronicity   [place] Pickard, Cammie Mcgee, MD       Future Appointments             In 3 weeks [place] Schaumann, Cammie Mcgee, MD [place], [facility]            Passed - Last BP in normal range    BP Readings from Last 1 Encounters:  11/25/20 128/72

## 2022-01-10 DIAGNOSIS — M5416 Radiculopathy, lumbar region: Secondary | ICD-10-CM | POA: Diagnosis not present

## 2022-01-10 DIAGNOSIS — M9903 Segmental and somatic dysfunction of lumbar region: Secondary | ICD-10-CM | POA: Diagnosis not present

## 2022-01-10 DIAGNOSIS — M542 Cervicalgia: Secondary | ICD-10-CM | POA: Diagnosis not present

## 2022-01-10 DIAGNOSIS — M9901 Segmental and somatic dysfunction of cervical region: Secondary | ICD-10-CM | POA: Diagnosis not present

## 2022-01-11 ENCOUNTER — Ambulatory Visit (INDEPENDENT_AMBULATORY_CARE_PROVIDER_SITE_OTHER): Payer: Medicare HMO | Admitting: Family Medicine

## 2022-01-11 VITALS — BP 160/80 | HR 80 | Temp 97.8°F | Ht 64.0 in | Wt 209.0 lb

## 2022-01-11 DIAGNOSIS — M542 Cervicalgia: Secondary | ICD-10-CM | POA: Diagnosis not present

## 2022-01-11 MED ORDER — PREDNISONE 20 MG PO TABS: ORAL_TABLET | ORAL | 0 refills | Status: DC

## 2022-01-11 MED ORDER — CYCLOBENZAPRINE HCL 5 MG PO TABS: 5.0000 mg | ORAL_TABLET | Freq: Three times a day (TID) | ORAL | 1 refills | Status: DC | PRN

## 2022-01-11 NOTE — Assessment & Plan Note (Signed)
Patient presented with acute neck stiffness and pain that is worsening. Her ROM is limited to approximately 10 degrees in any direction. No fever, nausea, vomiting, weakness of arms or legs, confusion, vision changes. Will obtain cervical x-ray to rule out fracture. Start prednisone taper for acute inflammation and flexeril '5mg'$  TID PRN for muscle spasms. Encouraged to seek medical care for fever, nausea, vomiting, weakness of arms or legs, confusion, or vision changes.

## 2022-01-11 NOTE — Progress Notes (Signed)
Acute Office Visit  Subjective:     Patient ID: Amanda Pope, female    DOB: 1945-12-04, 76 y.o.   MRN: [id number]  Chief Complaint  Patient presents with   Follow-up    pain in neck/cannot turn head please schedule AWV      HPI Patient is in today for neck stiffness and pain since last Friday night that is getting worse, with shooting, aching pain into her shoulders. Cannot identify any triggers from Friday. She did see the chiropractor yesterday without relief. Has been seeing a chiropractor ever other week. She felt a right shoulder pop and pain at a recent visit.  Denies fever, chills, night sweats, N/V, confusion, sensory changes, or weakness in extremities. Has tried cold and heat applications, Ibuprofen, Tylenol.    Review of Systems  All other systems reviewed and are negative.   Past Medical History:  Diagnosis Date   Arthritis    hip & back   Asthma    COPD (chronic obstructive pulmonary disease) (HCC)    Cystocele    Depression    Diabetes mellitus without complication ([place])    Phreesia 05/27/2020   GERD (gastroesophageal reflux disease)    History of Helicobacter pylori infection    Hypertension    Shortness of breath dyspnea    Urticaria    Vitamin D deficiency    Past Surgical History:  Procedure Laterality Date   ABDOMINAL HYSTERECTOMY     And Removed 1 ovary   APPENDECTOMY     CHOLECYSTECTOMY  05/2007   TONSILLECTOMY     TOTAL HIP ARTHROPLASTY Right 06/09/2015   TOTAL HIP ARTHROPLASTY Right 06/09/2015   Procedure: RIGHT TOTAL HIP ARTHROPLASTY ANTERIOR APPROACH;  Surgeon: [name], MD;  Location: [facility];  Service: Orthopedics;  Laterality: Right;   TUBAL LIGATION     Current Outpatient Medications on File Prior to Visit  Medication Sig Dispense Refill   Accu-Chek FastClix Lancets MISC Use to check FBS daily DX: E11.9 100 each 3   ACCU-CHEK GUIDE test strip USE TO CHECK FBS DAILY DX: E11.9 100 strip 12   albuterol (VENTOLIN HFA) 108 (90  Base) MCG/ACT inhaler Inhale 2 puffs into the lungs every 6 (six) hours as needed for wheezing or shortness of breath. 2 each 3   aspirin EC 325 MG tablet Take 1 tablet (325 mg total) by mouth 2 (two) times daily. 84 tablet 0   blood glucose meter kit and supplies Dispense based on patient and insurance preference. Use up to 1 time daily as directed. (FOR ICD-10 E10.9, E11.9). 1 each 0   Blood Glucose Monitoring Suppl (ACCU-CHEK AVIVA PLUS) w/Device KIT Use to check FBS daily DX: E11.9 1 kit 1   budesonide-formoterol (SYMBICORT) 160-4.5 MCG/ACT inhaler Inhale 2 puffs into the lungs 2 (two) times daily. 2 each 3   calcium gluconate 500 MG tablet Take 1 tablet (500 mg total) by mouth 3 (three) times daily. 30 tablet 11   Continuous Blood Gluc Receiver DEVI 1 Device by Does not apply route daily. Please dispense based on patient and insurance preference. Use as directed to monitor blood glucose levels. Dx: E11.9. 1 each 1   Continuous Blood Gluc Transmit MISC 1 each by Does not apply route daily. Please dispense based on patient and insurance preference. Use as directed to monitor blood glucose levels. Dx: E11.9. 2 each 11   [employer] D3 2000 units CAPS TAKE 2 CAPSULES BY MOUTH ONCE A DAY 60 capsule 9  estradiol (ESTRACE) 0.5 MG tablet TAKE 1 TABLET BY MOUTH EVERY DAY 90 tablet 1   fluticasone (FLONASE) 50 MCG/ACT nasal spray SPRAY 2 SPRAYS INTO EACH NOSTRIL EVERY DAY 48 mL 2   gabapentin (NEURONTIN) 100 MG capsule TAKE 1 CAPSULE BY MOUTH 3 TIMES DAILY AS NEEDED FOR PAIN 90 capsule 3   hydrochlorothiazide (HYDRODIURIL) 25 MG tablet TAKE 1 TABLET BY MOUTH EVERY DAY 90 tablet 2   HYDROcodone-homatropine (HYCODAN) 5-1.5 MG/5ML syrup Take 5 mLs by mouth every 8 (eight) hours as needed for cough. 120 mL 0   Lancets Misc. (ACCU-CHEK FASTCLIX LANCET) KIT Use to check FBS daily DX: E11.9 1 kit 1   levocetirizine (XYZAL) 5 MG tablet TAKE 1 TABLET BY MOUTH EVERY DAY IN THE EVENING 90 tablet 1   losartan (COZAAR) 100  MG tablet TAKE 1 TABLET BY MOUTH EVERY DAY 90 tablet 3   metFORMIN (GLUCOPHAGE) 1000 MG tablet TAKE 1 TABLET (1,000 MG TOTAL) BY MOUTH TWICE A DAY WITH FOOD 180 tablet 2   omeprazole (PRILOSEC) 20 MG capsule TAKE 1 CAPSULE BY MOUTH EVERY DAY 90 capsule 3   No current facility-administered medications on file prior to visit.   Allergies  Allergen Reactions   Prevalite [Cholestyramine Light] Hives   Ace Inhibitors Rash       Objective:    BP (!) 160/80   Pulse 80   Temp 97.8 F (36.6 C) (Oral)   Ht _0  (1.626 m)   Wt 209 lb (94.8 kg)   SpO2 95%   BMI 35.87 kg/m    Physical Exam Vitals and nursing note reviewed.  Constitutional:      Appearance: Normal appearance. She is normal weight.  HENT:     Head: Normocephalic and atraumatic.  Musculoskeletal:     Cervical back: Rigidity and tenderness present. No signs of trauma or crepitus. Pain with movement, spinous process tenderness and muscular tenderness present. Decreased range of motion.  Skin:    General: Skin is warm and dry.  Neurological:     General: No focal deficit present.     Mental Status: She is alert and oriented to person, place, and time. Mental status is at baseline.  Psychiatric:        Mood and Affect: Mood normal.        Behavior: Behavior normal.        Thought Content: Thought content normal.        Judgment: Judgment normal.     No results found for any visits on 01/11/22.      Assessment & Plan:   Problem List Items Addressed This Visit       Other   Neck pain, acute - Primary    Patient presented with acute neck stiffness and pain that is worsening. Her ROM is limited to approximately 10 degrees in any direction. No fever, nausea, vomiting, weakness of arms or legs, confusion, vision changes. Will obtain cervical x-ray to rule out fracture. Start prednisone taper for acute inflammation and flexeril 27m TID PRN for muscle spasms. Encouraged to seek medical care for fever, nausea, vomiting,  weakness of arms or legs, confusion, or vision changes.      Relevant Orders   DG Cervical Spine Complete    Meds ordered this encounter  Medications   predniSONE (DELTASONE) 20 MG tablet    Sig: 3 tabs poqday 1-2, 2 tabs poqday 3-4, 1 tab poqday 5-6    Dispense:  12 tablet    Refill:  0    Order Specific Question:   Supervising Provider    Answer:   Jenna Luo T [[id number]]   cyclobenzaprine (FLEXERIL) 5 MG tablet    Sig: Take 1 tablet (5 mg total) by mouth 3 (three) times daily as needed for muscle spasms.    Dispense:  30 tablet    Refill:  1    Order Specific Question:   Supervising Provider    Answer:   Jenna Luo T [[id number]]    Return if symptoms worsen or fail to improve.  Rubie Maid, FNP

## 2022-01-15 ENCOUNTER — Ambulatory Visit
Admission: RE | Admit: 2022-01-15 | Discharge: 2022-01-15 | Disposition: A | Discharge status: Home / Self Care | Payer: Medicare HMO | Source: Ambulatory Visit | Attending: Family Medicine | Admitting: Family Medicine

## 2022-01-15 DIAGNOSIS — M542 Cervicalgia: Secondary | ICD-10-CM | POA: Diagnosis not present

## 2022-01-17 NOTE — Progress Notes (Signed)
[facility]) [place] Team Statin Quality Measure Assessment  01/17/2022  Amanda Pope [birth year] [id number]  Per review of chart and payor information, patient has a diagnosis of diabetes but is not currently filling a statin prescription.  This places patient into the Statin Use In Patients with Diabetes (SUPD) measure for CMS.    I could not find any documentation of previous trial of a statin or a history of statin intolerance.  The 10-year ASCVD risk score (Arnett DK, et al., 2019) is: 47.4%   Values used to calculate the score:     Age: 76 years     Sex: Female     Is Non-Hispanic African American: No     Diabetic: Yes     Tobacco smoker: No     Systolic Blood Pressure: 017 mmHg     Is BP treated: Yes     HDL Cholesterol: 43 mg/dL     Total Cholesterol: 193 mg/dL      Component Value Date/Time   CHOL 193 11/25/2020 1654   TRIG 273 (H) 11/25/2020 1654   HDL 43 (L) 11/25/2020 1654   CHOLHDL 4.5 11/25/2020 1654   VLDL 37 (H) 03/01/2016 1210   LDLCALC 111 (H) 11/25/2020 1654    Please consider ONE of the following recommendations:  Initiate high intensity statin Atorvastatin 40 mg once daily, #90, 3 refills   Rosuvastatin 20 mg once daily, #90, 3 refills    Initiate moderate intensity          statin with reduced frequency if prior          statin intolerance 1x weekly, #13, 3 refills   2x weekly, #26, 3 refills   3x weekly, #39, 3 refills    Code for past statin intolerance or  other exclusions (required annually)  Provider Requirements: Associate code during an office visit or telehealth encounter  Drug Induced Myopathy G72.0   Myopathy, unspecified G72.9   Myositis, unspecified M60.9   Rhabdomyolysis M62.82   Cirrhosis of liver K74.69   Prediabetes R73.03   PCOS E28.2   Thank you for allowing [facility] pharmacy to be a part of this patient's care. Kristeen Miss, PharmD Clinical Pharmacist [place] Cell:  [phone]

## 2022-01-19 ENCOUNTER — Encounter: Payer: Self-pay | Admitting: Family Medicine

## 2022-01-19 ENCOUNTER — Ambulatory Visit (INDEPENDENT_AMBULATORY_CARE_PROVIDER_SITE_OTHER): Payer: Medicare HMO | Admitting: Family Medicine

## 2022-01-19 VITALS — BP 132/78 | HR 86 | Ht 64.0 in | Wt 205.0 lb

## 2022-01-19 DIAGNOSIS — Z1231 Encounter for screening mammogram for malignant neoplasm of breast: Secondary | ICD-10-CM

## 2022-01-19 DIAGNOSIS — M542 Cervicalgia: Secondary | ICD-10-CM | POA: Diagnosis not present

## 2022-01-19 DIAGNOSIS — Z0000 Encounter for general adult medical examination without abnormal findings: Secondary | ICD-10-CM | POA: Diagnosis not present

## 2022-01-19 DIAGNOSIS — E118 Type 2 diabetes mellitus with unspecified complications: Secondary | ICD-10-CM

## 2022-01-19 MED ORDER — CYCLOBENZAPRINE HCL 10 MG PO TABS: 10.0000 mg | ORAL_TABLET | Freq: Three times a day (TID) | ORAL | 0 refills | Status: DC | PRN

## 2022-01-19 NOTE — Progress Notes (Signed)
Subjective:    Patient ID: Amanda Pope, female    DOB: [birth year], 76 y.o.   MRN: [id number]  Patient is a 76 year old Caucasian female who presents today for complete physical exam.  She has a history of diabetes mellitus currently treated with metformin.  She is not on a statin despite having an elevated LDL cholesterol.  We had a long discussion today and I strongly recommended statin for cardiovascular prevention however she is hesitant to start a statin.  She is also on estrogen for hot flashes.  She states that she has been unable to stop taking estrogen due to the severity of the hot flashes.  We discussed the risk of breast cancer and stroke and she is willing to continue the medication although she will try to start weaning herself down.  She is also complaining of significant pain and stiffness in her neck.  She has pain bilaterally in the trapezius muscles of the neck.  There is no tenderness to palpation of the spinous processes of the transverse processes.  She denies any numbness or tingling or weakness in her arms.  Flexeril has helped some.  She is due shot, COVID booster, and an RSV vaccine.  Her pneumonia vaccine is up-to-date.  She is also due for shingles shot.  She is overdue for mammogram.  Her colonoscopy is up-to-date. Past Medical History:  Diagnosis Date   Arthritis    hip & back   Asthma    COPD (chronic obstructive pulmonary disease) (HCC)    Cystocele    Depression    Diabetes mellitus without complication ([place])    Phreesia 05/27/2020   GERD (gastroesophageal reflux disease)    History of Helicobacter pylori infection    Hypertension    Shortness of breath dyspnea    Urticaria    Vitamin D deficiency    Past Surgical History:  Procedure Laterality Date   ABDOMINAL HYSTERECTOMY     And Removed 1 ovary   APPENDECTOMY     CHOLECYSTECTOMY  05/2007   TONSILLECTOMY     TOTAL HIP ARTHROPLASTY Right 06/09/2015   TOTAL HIP ARTHROPLASTY Right 06/09/2015    Procedure: RIGHT TOTAL HIP ARTHROPLASTY ANTERIOR APPROACH;  Surgeon: [name], MD;  Location: [facility];  Service: Orthopedics;  Laterality: Right;   TUBAL LIGATION     Current Outpatient Medications on File Prior to Visit  Medication Sig Dispense Refill   Accu-Chek FastClix Lancets MISC Use to check FBS daily DX: E11.9 100 each 3   ACCU-CHEK GUIDE test strip USE TO CHECK FBS DAILY DX: E11.9 100 strip 12   albuterol (VENTOLIN HFA) 108 (90 Base) MCG/ACT inhaler Inhale 2 puffs into the lungs every 6 (six) hours as needed for wheezing or shortness of breath. 2 each 3   aspirin EC 325 MG tablet Take 1 tablet (325 mg total) by mouth 2 (two) times daily. 84 tablet 0   blood glucose meter kit and supplies Dispense based on patient and insurance preference. Use up to 1 time daily as directed. (FOR ICD-10 E10.9, E11.9). 1 each 0   Blood Glucose Monitoring Suppl (ACCU-CHEK AVIVA PLUS) w/Device KIT Use to check FBS daily DX: E11.9 1 kit 1   budesonide-formoterol (SYMBICORT) 160-4.5 MCG/ACT inhaler Inhale 2 puffs into the lungs 2 (two) times daily. 2 each 3   calcium gluconate 500 MG tablet Take 1 tablet (500 mg total) by mouth 3 (three) times daily. 30 tablet 11   Continuous Blood Gluc Receiver DEVI 1  Device by Does not apply route daily. Please dispense based on patient and insurance preference. Use as directed to monitor blood glucose levels. Dx: E11.9. 1 each 1   Continuous Blood Gluc Transmit MISC 1 each by Does not apply route daily. Please dispense based on patient and insurance preference. Use as directed to monitor blood glucose levels. Dx: E11.9. 2 each 11   [employer] D3 2000 units CAPS TAKE 2 CAPSULES BY MOUTH ONCE A DAY 60 capsule 9   estradiol (ESTRACE) 0.5 MG tablet TAKE 1 TABLET BY MOUTH EVERY DAY 90 tablet 1   fluticasone (FLONASE) 50 MCG/ACT nasal spray SPRAY 2 SPRAYS INTO EACH NOSTRIL EVERY DAY 48 mL 2   gabapentin (NEURONTIN) 100 MG capsule TAKE 1 CAPSULE BY MOUTH 3 TIMES DAILY AS NEEDED FOR PAIN  90 capsule 3   hydrochlorothiazide (HYDRODIURIL) 25 MG tablet TAKE 1 TABLET BY MOUTH EVERY DAY 90 tablet 2   HYDROcodone-homatropine (HYCODAN) 5-1.5 MG/5ML syrup Take 5 mLs by mouth every 8 (eight) hours as needed for cough. 120 mL 0   Lancets Misc. (ACCU-CHEK FASTCLIX LANCET) KIT Use to check FBS daily DX: E11.9 1 kit 1   levocetirizine (XYZAL) 5 MG tablet TAKE 1 TABLET BY MOUTH EVERY DAY IN THE EVENING 90 tablet 1   losartan (COZAAR) 100 MG tablet TAKE 1 TABLET BY MOUTH EVERY DAY 90 tablet 3   metFORMIN (GLUCOPHAGE) 1000 MG tablet TAKE 1 TABLET (1,000 MG TOTAL) BY MOUTH TWICE A DAY WITH FOOD 180 tablet 2   omeprazole (PRILOSEC) 20 MG capsule TAKE 1 CAPSULE BY MOUTH EVERY DAY 90 capsule 3   predniSONE (DELTASONE) 20 MG tablet 3 tabs poqday 1-2, 2 tabs poqday 3-4, 1 tab poqday 5-6 12 tablet 0   No current facility-administered medications on file prior to visit.   Allergies  Allergen Reactions   Prevalite [Cholestyramine Light] Hives   Ace Inhibitors Rash   Social History   Socioeconomic History   Marital status: Married    Spouse name: Not on file   Number of children: Not on file   Years of education: Not on file   Highest education level: Not on file  Occupational History   Not on file  Tobacco Use   Smoking status: Former    Packs/day: 1.00    Types: Cigarettes    Start date: 01/29/1965    Quit date: 10/30/2010    Years since quitting: 11.2   Smokeless tobacco: Never  Substance and Sexual Activity   Alcohol use: No    Alcohol/week: 0.0 standard drinks of alcohol   Drug use: No   Sexual activity: Not on file  Other Topics Concern   Not on file  Social History Narrative   Entered 07/2013:   Is retired. Keeps [facility]. Loves it.   Married.   Social Determinants of Health   Financial Resource Strain: Low Risk  (05/27/2020)   Overall Financial Resource Strain (CARDIA)    Difficulty of Paying Living Expenses: Not hard at all  Food Insecurity: No Food Insecurity  (05/27/2020)   Hunger Vital Sign    Worried About Running Out of Food in the Last Year: Never true    Ran Out of Food in the Last Year: Never true  Transportation Needs: No Transportation Needs (05/27/2020)   PRAPARE - Hydrologist (Medical): No    Lack of Transportation (Non-Medical): No  Physical Activity: Inactive (05/27/2020)   Exercise Vital Sign    Days of  Exercise per Week: 0 days    Minutes of Exercise per Session: 0 min  Stress: No Stress Concern Present (05/27/2020)   [place]    Feeling of Stress : Not at all  Social Connections: Moderately Isolated (05/27/2020)   Social Connection and Isolation Panel [NHANES]    Frequency of Communication with Friends and Family: More than three times a week    Frequency of Social Gatherings with Friends and Family: More than three times a week    Attends Religious Services: Never    Marine scientist or Organizations: No    Attends Archivist Meetings: Never    Marital Status: Married  Human resources officer Violence: Not At Risk (05/27/2020)   Humiliation, Afraid, Rape, and Kick questionnaire    Fear of Current or Ex-Partner: No    Emotionally Abused: No    Physically Abused: No    Sexually Abused: No     Review of Systems  All other systems reviewed and are negative.      Objective:   Physical Exam Constitutional:      General: She is not in acute distress.    Appearance: Normal appearance. She is not ill-appearing, toxic-appearing or diaphoretic.  HENT:     Nose: Congestion present.  Cardiovascular:     Rate and Rhythm: Normal rate and regular rhythm.     Heart sounds: Normal heart sounds. No murmur heard. Pulmonary:     Effort: Pulmonary effort is normal. No respiratory distress.     Breath sounds: Normal breath sounds. No stridor. No wheezing, rhonchi or rales.  Chest:     Chest wall: No tenderness.  Abdominal:      General: Abdomen is flat. Bowel sounds are normal. There is no distension.     Palpations: Abdomen is soft.     Tenderness: There is no abdominal tenderness. There is no right CVA tenderness or guarding.  Neurological:     Mental Status: She is alert.           Assessment & Plan:  Diabetes mellitus type 2 with complications ([place]) - Plan: CBC with Differential/Platelet, COMPLETE METABOLIC PANEL WITH GFR, Lipid panel, Hemoglobin A1c, Protein / Creatinine Ratio, Urine  Encounter for screening mammogram for malignant neoplasm of breast - Plan: MM Digital Screening  Encounter for Medicare annual wellness exam  Neck pain, acute I recommended she try to wean herself away from the estradiol due to the risk of stroke and breast cancer.  I will schedule the patient for mammogram.  I will increase the Flexeril to 10 mg every 8 hours and if not improving by next week, I would recommend physical therapy what I believe is a cervical muscle strain and muscle spasms.  Colonoscopy is up-to-date and Pap smear is not necessary.  Recommended shingles vaccine, COVID booster, and flu shot but she politely defers these.  Recommended a statin but she is hesitant to take the medication.  Check an A1c, fasting lipid panel, and a urine protein to creatinine ratio.  I want her A1c less than 6.5, her LDL cholesterol below 100.

## 2022-01-20 LAB — CBC WITH DIFFERENTIAL/PLATELET
Absolute Monocytes: 643 cells/uL (ref 200–950)
Basophils Absolute: 76 cells/uL (ref 0–200)
Basophils Relative: 0.7 %
Eosinophils Absolute: 1003 cells/uL — ABNORMAL HIGH (ref 15–500)
Eosinophils Relative: 9.2 %
HCT: 38.3 % (ref 35.0–45.0)
Hemoglobin: 12.8 g/dL (ref 11.7–15.5)
Lymphs Abs: 2638 cells/uL (ref 850–3900)
MCH: 28.4 pg (ref 27.0–33.0)
MCHC: 33.4 g/dL (ref 32.0–36.0)
MCV: 85.1 fL (ref 80.0–100.0)
MPV: 9.9 fL (ref 7.5–12.5)
Monocytes Relative: 5.9 %
Neutro Abs: 6540 cells/uL (ref 1500–7800)
Neutrophils Relative %: 60 %
Platelets: 336 10*3/uL (ref 140–400)
RBC: 4.5 10*6/uL (ref 3.80–5.10)
RDW: 12.6 % (ref 11.0–15.0)
Total Lymphocyte: 24.2 %
WBC: 10.9 10*3/uL — ABNORMAL HIGH (ref 3.8–10.8)

## 2022-01-20 LAB — COMPLETE METABOLIC PANEL WITH GFR
AG Ratio: 1.3 (calc) (ref 1.0–2.5)
ALT: 21 U/L (ref 6–29)
AST: 26 U/L (ref 10–35)
Albumin: 4.2 g/dL (ref 3.6–5.1)
Alkaline phosphatase (APISO): 90 U/L (ref 37–153)
BUN: 23 mg/dL (ref 7–25)
CO2: 30 mmol/L (ref 20–32)
Calcium: 9.7 mg/dL (ref 8.6–10.4)
Chloride: 99 mmol/L (ref 98–110)
Creat: 0.99 mg/dL (ref 0.60–1.00)
Globulin: 3.3 g/dL (calc) (ref 1.9–3.7)
Glucose, Bld: 137 mg/dL — ABNORMAL HIGH (ref 65–99)
Potassium: 3.5 mmol/L (ref 3.5–5.3)
Sodium: 141 mmol/L (ref 135–146)
Total Bilirubin: 0.8 mg/dL (ref 0.2–1.2)
Total Protein: 7.5 g/dL (ref 6.1–8.1)
eGFR: 60 mL/min/{1.73_m2} (ref >=60)

## 2022-01-20 LAB — LIPID PANEL
Cholesterol: 168 mg/dL (ref <200)
HDL: 46 mg/dL — ABNORMAL LOW (ref >=50)
LDL Cholesterol (Calc): 92 mg/dL (calc)
Non-HDL Cholesterol (Calc): 122 mg/dL (calc) (ref <130)
Total CHOL/HDL Ratio: 3.7 (calc) (ref <5.0)
Triglycerides: 198 mg/dL — ABNORMAL HIGH (ref <150)

## 2022-01-20 LAB — HEMOGLOBIN A1C
Hgb A1c MFr Bld: 7.1 % of total Hgb — ABNORMAL HIGH (ref <5.7)
Mean Plasma Glucose: 157 mg/dL
eAG (mmol/L): 8.7 mmol/L

## 2022-01-20 LAB — PROTEIN / CREATININE RATIO, URINE
Creatinine, Urine: 259 mg/dL (ref 20–275)
Protein/Creat Ratio: 131 mg/g creat (ref 24–184)
Protein/Creatinine Ratio: 0.131 mg/mg creat (ref 0.024–0.184)
Total Protein, Urine: 34 mg/dL — ABNORMAL HIGH (ref 5–24)

## 2022-01-23 ENCOUNTER — Other Ambulatory Visit: Payer: Self-pay

## 2022-01-23 ENCOUNTER — Telehealth: Payer: Self-pay | Admitting: Family Medicine

## 2022-01-23 DIAGNOSIS — M542 Cervicalgia: Secondary | ICD-10-CM

## 2022-01-23 DIAGNOSIS — E118 Type 2 diabetes mellitus with unspecified complications: Secondary | ICD-10-CM

## 2022-01-23 MED ORDER — EMPAGLIFLOZIN 25 MG PO TABS: 25.0000 mg | ORAL_TABLET | Freq: Every day | ORAL | 3 refills | Status: DC

## 2022-01-23 NOTE — Telephone Encounter (Signed)
Patient states that her neck is still bothering her. She states that last Friday she was advised to call in this week if no better that Dr. Dennard Schaumann said he would send her for therapy.   Referral for PT ordered per Dr. Samella Parr order.   Pt asks if there is another medication she can take to help with her neck? Pt states the pain is worse. Thank you.

## 2022-01-23 NOTE — Telephone Encounter (Signed)
Patient states that her neck is still bothering her. She states that last Friday she was advised to call in this week if no better that Dr. Dennard Schaumann said he would send her for therapy.   CB# [phone]

## 2022-01-25 ENCOUNTER — Other Ambulatory Visit: Payer: Self-pay | Admitting: Family Medicine

## 2022-01-25 MED ORDER — PREDNISONE 20 MG PO TABS: ORAL_TABLET | ORAL | 0 refills | Status: DC

## 2022-01-25 NOTE — Therapy (Signed)
OUTPATIENT PHYSICAL THERAPY CERVICAL EVALUATION   Patient Name: Amanda Pope MRN: [id number] DOB:17-Aug-1945, 76 y.o., female Today's Date: 01/26/2022  END OF SESSION:  PT End of Session - 01/26/22 1152     Visit Number 1    Number of Visits 8    Date for PT Re-Evaluation 03/23/22    Authorization Type humana    PT Start Time 1000    PT Stop Time 1045    PT Time Calculation (min) 45 min    Activity Tolerance Patient tolerated treatment well;Patient limited by pain    Behavior During Therapy [facility] for tasks assessed/performed             Past Medical History:  Diagnosis Date   Arthritis    hip & back   Asthma    COPD (chronic obstructive pulmonary disease) ([place])    Cystocele    Depression    Diabetes mellitus without complication ([place])    Phreesia 05/27/2020   GERD (gastroesophageal reflux disease)    History of Helicobacter pylori infection    Hypertension    Shortness of breath dyspnea    Urticaria    Vitamin D deficiency    Past Surgical History:  Procedure Laterality Date   ABDOMINAL HYSTERECTOMY     And Removed 1 ovary   APPENDECTOMY     CHOLECYSTECTOMY  05/2007   TONSILLECTOMY     TOTAL HIP ARTHROPLASTY Right 06/09/2015   TOTAL HIP ARTHROPLASTY Right 06/09/2015   Procedure: RIGHT TOTAL HIP ARTHROPLASTY ANTERIOR APPROACH;  Surgeon: [name], MD;  Location: [facility];  Service: Orthopedics;  Laterality: Right;   TUBAL LIGATION     Patient Active Problem List   Diagnosis Date Noted   Neck pain, acute 01/11/2022   Pain of both hip joints 08/19/2018   Chronic left-sided low back pain with left-sided sciatica 08/19/2018   Osteoarthritis of right hip 06/09/2015   Hip joint replacement status 06/09/2015   Chronic urticaria 11/23/2014   Other allergic rhinitis 11/23/2014   Asthma with COPD 11/23/2014   Hormone replacement therapy (postmenopausal) 08/27/2013   Subclinical hypothyroidism 08/27/2013   Hyperglycemia 08/27/2013   Cystocele    Hypertension     Depression    History of Helicobacter pylori infection    Vitamin D deficiency    COPD (chronic obstructive pulmonary disease) ([place])     PCP: [name], MD   REFERRING PROVIDER: [name], MD   REFERRING DIAG: M54.2 (ICD-10-CM) - Neck pain   THERAPY DIAG: Neck pain   Rationale for Evaluation and Treatment: Rehabilitation  ONSET DATE: chronic  SUBJECTIVE:  SUBJECTIVE STATEMENT: Reports a 4 week history of neck pain.  Underwent chiropractic adjustment which exacerbated pain symptoms approximately 4 weeks ago.  Symptoms improved while on prednisone.  PERTINENT HISTORY:  Patient is a 76 year old Caucasian female who presents today for complete physical exam. She has a history of diabetes mellitus currently treated with metformin. She is not on a statin despite having an elevated LDL cholesterol. We had a long discussion today and I strongly recommended statin for cardiovascular prevention however she is hesitant to start a statin. She is also on estrogen for hot flashes. She states that she has been unable to stop taking estrogen due to the severity of the hot flashes. We discussed the risk of breast cancer and stroke and she is willing to continue the medication although she will try to start weaning herself down. She is also complaining of significant pain and stiffness in her neck. She has pain bilaterally in the trapezius muscles of the neck. There is no tenderness to palpation of the spinous processes of the transverse processes. She denies any numbness or tingling or weakness in her arms. Flexeril has helped some. She is due shot, COVID booster, and an RSV vaccine. Her pneumonia vaccine is up-to-date. She is also due for shingles shot. She is overdue for mammogram. Her colonoscopy is  up-to-date.  PAIN:  Are you having pain? Yes: NPRS scale: 8/10 Pain location: Neck Pain description: ache Aggravating factors: cervical ROM Relieving factors: rest and medication  PRECAUTIONS: None  WEIGHT BEARING RESTRICTIONS: No  FALLS:  Has patient fallen in last 6 months? No  LIVING ENVIRONMENT: Lives with: lives with their family  OCCUPATION: retired  PLOF: Independent  PATIENT GOALS: To get rid of my pain and move my neck again  NEXT MD VISIT: 1 month  OBJECTIVE:   DIAGNOSTIC FINDINGS:  CLINICAL DATA:  Acute neck pain and stiffness, no known injury   EXAM: CERVICAL SPINE - COMPLETE 4+ VIEW   COMPARISON:  None Available.   FINDINGS: No fracture or static subluxation of the cervical spine. Normal cervical lordosis. Minimal disc space height loss and osteophytosis. Asymmetric right-sided cervical facet degenerative change with right-sided bony neural foraminal stenosis at C3 through C6. The skull base, cervical soft tissues, and upper chest are unremarkable.   IMPRESSION: 1.  No fracture or static subluxation of the cervical spine.   2.  Minimal disc space height loss and osteophytosis.   3. Asymmetric right-sided cervical facet degenerative change with right-sided bony neural foraminal stenosis at C3 through C6.   4. Cervical disc and neural foraminal pathology may be further evaluated by MRI if indicated by neurologically localizing signs and symptoms.     Electronically Signed   By: Delanna Ahmadi M.D.   On: 01/15/2022 14:32  PATIENT SURVEYS:  FOTO 35(55 predicted)  COGNITION: Overall cognitive status: Within functional limits for tasks assessed  SENSATION: Not tested  POSTURE: rounded shoulders, forward head, and elevated R shoulder  PALPATION: TTP B upper traps, suboccipitals and cervical paraspinals   CERVICAL ROM:   Active ROM A/PROM (deg) eval  Flexion 75%  Extension 50%  Right lateral flexion 25%  Left lateral flexion 10%   Right rotation 25%  Left rotation 50%   (Blank rows = not tested)  UPPER EXTREMITY ROM: WFL throughout  Active ROM Right eval Left eval  Shoulder flexion    Shoulder extension    Shoulder abduction    Shoulder adduction    Shoulder extension    Shoulder internal  rotation    Shoulder external rotation    Elbow flexion    Elbow extension    Wrist flexion    Wrist extension    Wrist ulnar deviation    Wrist radial deviation    Wrist pronation    Wrist supination     (Blank rows = not tested)  UPPER EXTREMITY MMT: [facility]   MMT Right eval Left eval  Shoulder flexion    Shoulder extension    Shoulder abduction    Shoulder adduction    Shoulder extension    Shoulder internal rotation    Shoulder external rotation    Middle trapezius    Lower trapezius    Elbow flexion    Elbow extension    Wrist flexion    Wrist extension    Wrist ulnar deviation    Wrist radial deviation    Wrist pronation    Wrist supination    Grip strength     (Blank rows = not tested)  CERVICAL SPECIAL TESTS:  Neck flexor muscle endurance test: Positive 6s duration  FUNCTIONAL TESTS:  Deferred   TODAY'S TREATMENT:                                                                                                                              DATE: 02/26/21 Eval and HEP  PATIENT EDUCATION:  Education details: Discussed eval findings, rehab rationale and POC and patient is in agreement  Person educated: Patient Education method: Explanation Education comprehension: verbalized understanding and needs further education  HOME EXERCISE PROGRAM: Access Code: [email or web address] URL: [email or web address] Date: 01/26/2022 Prepared by: Sharlynn Oliphant  Exercises - Seated Shoulder Shrug Circles AROM Forward  - 3 x daily - 5 x weekly - 1 sets - 10 reps - Seated Scapular Retraction  - 3 x daily - 5 x weekly - 1 sets - 10 reps - Seated Cervical Retraction  - 3 x daily - 5 x weekly - 1 sets - 10  reps  ASSESSMENT:  CLINICAL IMPRESSION: Patient is a 76 y.o. female who was seen today for physical therapy evaluation and treatment for acute neck pain following chiropractic adjustment.  She demos ROM restrictions in all planes in cervical spine.  BUE ROM and strength are WFL and no radicular symptoms reported.  Isolated upper cervical rotation limited to 50% B with pain and tightness reported.  TTP in upper traps and cervical paraspinals as well as suboccipitals.  Patient showing signs of potential upper cervical dysfunction but muscle guarding prohibits further assessment.  OBJECTIVE IMPAIRMENTS: decreased knowledge of condition, decreased mobility, decreased ROM, increased muscle spasms, postural dysfunction, obesity, and pain.   ACTIVITY LIMITATIONS: carrying, lifting, and reach over head  PERSONAL FACTORS: Age, Fitness, and 1 comorbidity: COPD  are also affecting patient's functional outcome.   REHAB POTENTIAL: Good  CLINICAL DECISION MAKING: Stable/uncomplicated  EVALUATION COMPLEXITY: Low   GOALS: Goals reviewed with patient? No  SHORT TERM GOALS: Target date: 02/09/2022  Patient to demonstrate independence in HEP  Baseline: [id number] Goal status: INITIAL  2.  Decrease pain to 6/10 at worst Baseline: 8/10 Goal status: INITIAL    LONG TERM GOALS: Target date: 02/23/2022    Decrease worst pain to 4/10 Baseline: 8/10 Goal status: INITIAL  2.  Patient to demo increased cervical ROM to 50% B side bending and R rotation Baseline:  Active ROM A/PROM (deg) eval  Flexion 75%  Extension 50%  Right lateral flexion 25%  Left lateral flexion 10%  Right rotation 25%  Left rotation 50%   Goal status: INITIAL  3.  Increase FOTO score to 55 Baseline: 35 Goal status: INITIAL  4.  Increase deep neck flexor endurance to 20s Baseline: 6s due to fatigue and pain Goal status: INITIAL     PLAN:  PT FREQUENCY: 2x/week  PT DURATION: 4 weeks  PLANNED  INTERVENTIONS: Therapeutic exercises, Therapeutic activity, Neuromuscular re-education, Balance training, Gait training, Patient/Family education, Self Care, Joint mobilization, Dry Needling, Spinal mobilization, Manual therapy, and Re-evaluation  PLAN FOR NEXT SESSION: HEP review and update, posture training, ROM and stretching tasks, manual techniques as indicated, further assessment of upper cervical dysfunction   Lanice Shirts, PT 01/26/2022, 12:07 PM  Referring diagnosis? cervicalgia Treatment diagnosis? (if different than referring diagnosis) cervicalgia What was this (referring dx) caused by? '[]'$  Surgery '[]'$  Fall '[x]'$  Ongoing issue '[x]'$  Arthritis '[]'$  Other: ____________  Laterality: '[]'$  Rt '[]'$  Lt '[x]'$  Both  Check all possible CPT codes:  *CHOOSE 10 OR LESS*    '[x]'$  97110 (Therapeutic Exercise)  '[]'$  92507 (SLP Treatment)  '[x]'$  97112 (Neuro Re-ed)   '[]'$  92526 (Swallowing Treatment)    '[x]'$ 97116 (Gait Training)   '[]'$  [id number] (Cognitive Training, 1st 15 minutes) '[x]'$  97140 (Manual Therapy)   '[]'$  97130 (Cognitive Training, each add'l 15 minutes)  '[x]'$  97164 (Re-evaluation)                              '[]'$  Other, List CPT Code ____________  '[x]'$  54270 (Therapeutic Activities)     '[x]'$  97535 (Self Care)   '[]'$  All codes above (97110 - 97535)  '[]'$  97012 (Mechanical Traction)  '[]'$  97014 (E-stim Unattended)  '[]'$  97032 (E-stim manual)  '[]'$  97033 (Ionto)  '[]'$  97035 (Ultrasound) '[]'$  97750 (Physical Performance Training) '[]'$  [id number] (Aquatic Therapy) '[]'$  97016 (Vasopneumatic Device) '[]'$  [id number] (Paraffin) '[]'$  97034 (Contrast Bath) '[]'$  97597 (Wound Care 1st 20 sq cm) '[]'$  97598 (Wound Care each add'l 20 sq cm) '[]'$  97760 (Orthotic Fabrication, Fitting, Training Initial) '[]'$  [id number] (Prosthetic Management and Training Initial) '[]'$  [id number] (Orthotic or Prosthetic Training/ Modification Subsequent)

## 2022-01-26 ENCOUNTER — Other Ambulatory Visit: Payer: Self-pay

## 2022-01-26 ENCOUNTER — Ambulatory Visit: Payer: Medicare HMO | Attending: Family Medicine

## 2022-01-26 DIAGNOSIS — R293 Abnormal posture: Secondary | ICD-10-CM | POA: Insufficient documentation

## 2022-01-26 DIAGNOSIS — M542 Cervicalgia: Secondary | ICD-10-CM | POA: Diagnosis not present

## 2022-01-30 NOTE — Therapy (Deleted)
OUTPATIENT PHYSICAL THERAPY TREATMENT NOTE   Patient Name: Amanda Pope MRN: [id number] DOB:[birth year], 77 y.o., female Today's Date: 01/30/2022  PCP: [name], MD   REFERRING PROVIDER: [name], MD    END OF SESSION:    Past Medical History:  Diagnosis Date   Arthritis    hip & back   Asthma    COPD (chronic obstructive pulmonary disease) ([place])    Cystocele    Depression    Diabetes mellitus without complication ([place])    Phreesia 05/27/2020   GERD (gastroesophageal reflux disease)    History of Helicobacter pylori infection    Hypertension    Shortness of breath dyspnea    Urticaria    Vitamin D deficiency    Past Surgical History:  Procedure Laterality Date   ABDOMINAL HYSTERECTOMY     And Removed 1 ovary   APPENDECTOMY     CHOLECYSTECTOMY  05/2007   TONSILLECTOMY     TOTAL HIP ARTHROPLASTY Right 06/09/2015   TOTAL HIP ARTHROPLASTY Right 06/09/2015   Procedure: RIGHT TOTAL HIP ARTHROPLASTY ANTERIOR APPROACH;  Surgeon: [name], MD;  Location: [facility];  Service: Orthopedics;  Laterality: Right;   TUBAL LIGATION     Patient Active Problem List   Diagnosis Date Noted   Neck pain, acute 01/11/2022   Pain of both hip joints 08/19/2018   Chronic left-sided low back pain with left-sided sciatica 08/19/2018   Osteoarthritis of right hip 06/09/2015   Hip joint replacement status 06/09/2015   Chronic urticaria 11/23/2014   Other allergic rhinitis 11/23/2014   Asthma with COPD 11/23/2014   Hormone replacement therapy (postmenopausal) 08/27/2013   Subclinical hypothyroidism 08/27/2013   Hyperglycemia 08/27/2013   Cystocele    Hypertension    Depression    History of Helicobacter pylori infection    Vitamin D deficiency    COPD (chronic obstructive pulmonary disease) ([facility])     REFERRING DIAG: M54.2 (ICD-10-CM) - Neck pain    THERAPY DIAG: Neck pain    Rationale for Evaluation and Treatment Rehabilitation  PERTINENT HISTORY: Patient is a  77 year old Caucasian female who presents today for complete physical exam. She has a history of diabetes mellitus currently treated with metformin. She is not on a statin despite having an elevated LDL cholesterol. We had a long discussion today and I strongly recommended statin for cardiovascular prevention however she is hesitant to start a statin. She is also on estrogen for hot flashes. She states that she has been unable to stop taking estrogen due to the severity of the hot flashes. We discussed the risk of breast cancer and stroke and she is willing to continue the medication although she will try to start weaning herself down. She is also complaining of significant pain and stiffness in her neck. She has pain bilaterally in the trapezius muscles of the neck. There is no tenderness to palpation of the spinous processes of the transverse processes. She denies any numbness or tingling or weakness in her arms. Flexeril has helped some. She is due shot, COVID booster, and an RSV vaccine. Her pneumonia vaccine is up-to-date. She is also due for shingles shot. She is overdue for mammogram. Her colonoscopy is up-to-date.   PRECAUTIONS: ***  SUBJECTIVE:  SUBJECTIVE STATEMENT:  ***   PAIN:  Are you having pain? {[id number]}   OBJECTIVE: (objective measures completed at initial evaluation unless otherwise dated)  DIAGNOSTIC FINDINGS:  CLINICAL DATA:  Acute neck pain and stiffness, no known injury   EXAM: CERVICAL SPINE - COMPLETE 4+ VIEW   COMPARISON:  None Available.   FINDINGS: No fracture or static subluxation of the cervical spine. Normal cervical lordosis. Minimal disc space height loss and osteophytosis. Asymmetric right-sided cervical facet degenerative change with right-sided bony neural foraminal  stenosis at C3 through C6. The skull base, cervical soft tissues, and upper chest are unremarkable.   IMPRESSION: 1.  No fracture or static subluxation of the cervical spine.   2.  Minimal disc space height loss and osteophytosis.   3. Asymmetric right-sided cervical facet degenerative change with right-sided bony neural foraminal stenosis at C3 through C6.   4. Cervical disc and neural foraminal pathology may be further evaluated by MRI if indicated by neurologically localizing signs and symptoms.     Electronically Signed   By: Delanna Ahmadi M.D.   On: 01/15/2022 14:32   PATIENT SURVEYS:  FOTO 35(55 predicted)   COGNITION: Overall cognitive status: Within functional limits for tasks assessed   SENSATION: Not tested   POSTURE: rounded shoulders, forward head, and elevated R shoulder   PALPATION: TTP B upper traps, suboccipitals and cervical paraspinals         CERVICAL ROM:    Active ROM A/PROM (deg) eval  Flexion 75%  Extension 50%  Right lateral flexion 25%  Left lateral flexion 10%  Right rotation 25%  Left rotation 50%   (Blank rows = not tested)   UPPER EXTREMITY ROM: WFL throughout   Active ROM Right eval Left eval  Shoulder flexion      Shoulder extension      Shoulder abduction      Shoulder adduction      Shoulder extension      Shoulder internal rotation      Shoulder external rotation      Elbow flexion      Elbow extension      Wrist flexion      Wrist extension      Wrist ulnar deviation      Wrist radial deviation      Wrist pronation      Wrist supination       (Blank rows = not tested)   UPPER EXTREMITY MMT: [facility]    MMT Right eval Left eval  Shoulder flexion      Shoulder extension      Shoulder abduction      Shoulder adduction      Shoulder extension      Shoulder internal rotation      Shoulder external rotation      Middle trapezius      Lower trapezius      Elbow flexion      Elbow extension      Wrist flexion       Wrist extension      Wrist ulnar deviation      Wrist radial deviation      Wrist pronation      Wrist supination      Grip strength       (Blank rows = not tested)   CERVICAL SPECIAL TESTS:  Neck flexor muscle endurance test: Positive 6s duration   FUNCTIONAL TESTS:  Deferred    TODAY'S TREATMENT:  DATE: 02/26/21 Eval and HEP   PATIENT EDUCATION:  Education details: Discussed eval findings, rehab rationale and POC and patient is in agreement  Person educated: Patient Education method: Explanation Education comprehension: verbalized understanding and needs further education   HOME EXERCISE PROGRAM: Access Code: [email or web address] URL: [email or web address] Date: 01/26/2022 Prepared by: Sharlynn Oliphant   Exercises - Seated Shoulder Shrug Circles AROM Forward  - 3 x daily - 5 x weekly - 1 sets - 10 reps - Seated Scapular Retraction  - 3 x daily - 5 x weekly - 1 sets - 10 reps - Seated Cervical Retraction  - 3 x daily - 5 x weekly - 1 sets - 10 reps   ASSESSMENT:   CLINICAL IMPRESSION: Patient is a 77 y.o. female who was seen today for physical therapy evaluation and treatment for acute neck pain following chiropractic adjustment.  She demos ROM restrictions in all planes in cervical spine.  BUE ROM and strength are WFL and no radicular symptoms reported.  Isolated upper cervical rotation limited to 50% B with pain and tightness reported.  TTP in upper traps and cervical paraspinals as well as suboccipitals.  Patient showing signs of potential upper cervical dysfunction but muscle guarding prohibits further assessment.   OBJECTIVE IMPAIRMENTS: decreased knowledge of condition, decreased mobility, decreased ROM, increased muscle spasms, postural dysfunction, obesity, and pain.    ACTIVITY LIMITATIONS: carrying, lifting, and reach over head   PERSONAL  FACTORS: Age, Fitness, and 1 comorbidity: COPD  are also affecting patient's functional outcome.    REHAB POTENTIAL: Good   CLINICAL DECISION MAKING: Stable/uncomplicated   EVALUATION COMPLEXITY: Low     GOALS: Goals reviewed with patient? No   SHORT TERM GOALS: Target date: 02/09/2022     Patient to demonstrate independence in HEP  Baseline: [id number] Goal status: INITIAL   2.  Decrease pain to 6/10 at worst Baseline: 8/10 Goal status: INITIAL       LONG TERM GOALS: Target date: 02/23/2022     Decrease worst pain to 4/10 Baseline: 8/10 Goal status: INITIAL   2.  Patient to demo increased cervical ROM to 50% B side bending and R rotation Baseline:  Active ROM A/PROM (deg) eval  Flexion 75%  Extension 50%  Right lateral flexion 25%  Left lateral flexion 10%  Right rotation 25%  Left rotation 50%    Goal status: INITIAL   3.  Increase FOTO score to 55 Baseline: 35 Goal status: INITIAL   4.  Increase deep neck flexor endurance to 20s Baseline: 6s due to fatigue and pain Goal status: INITIAL         PLAN:   PT FREQUENCY: 2x/week   PT DURATION: 4 weeks   PLANNED INTERVENTIONS: Therapeutic exercises, Therapeutic activity, Neuromuscular re-education, Balance training, Gait training, Patient/Family education, Self Care, Joint mobilization, Dry Needling, Spinal mobilization, Manual therapy, and Re-evaluation   PLAN FOR NEXT SESSION: HEP review and update, posture training, ROM and stretching tasks, manual techniques as indicated, further assessment of upper cervical dysfunction    Lanice Shirts, PT 01/30/2022, 2:50 PM

## 2022-01-31 ENCOUNTER — Ambulatory Visit: Payer: PPO

## 2022-02-01 ENCOUNTER — Other Ambulatory Visit: Payer: Self-pay

## 2022-02-01 MED ORDER — LEVOCETIRIZINE DIHYDROCHLORIDE 5 MG PO TABS: ORAL_TABLET | ORAL | 1 refills | Status: DC

## 2022-02-01 NOTE — Therapy (Signed)
OUTPATIENT PHYSICAL THERAPY TREATMENT NOTE   Patient Name: Amanda Pope MRN: [id number] DOB:16-Apr-1945, 77 y.o., female Today's Date: 02/05/2022  PCP: [name], MD   REFERRING PROVIDER: [name], MD    END OF SESSION:   PT End of Session - 02/05/22 1000     Visit Number 2    Number of Visits 8    Date for PT Re-Evaluation 03/23/22    Authorization Type humana    PT Start Time 1000    PT Stop Time 1040    PT Time Calculation (min) 40 min    Activity Tolerance Patient tolerated treatment well;Patient limited by pain    Behavior During Therapy [facility] for tasks assessed/performed             Past Medical History:  Diagnosis Date   Arthritis    hip & back   Asthma    COPD (chronic obstructive pulmonary disease) ([place])    Cystocele    Depression    Diabetes mellitus without complication ([place])    Phreesia 05/27/2020   GERD (gastroesophageal reflux disease)    History of Helicobacter pylori infection    Hypertension    Shortness of breath dyspnea    Urticaria    Vitamin D deficiency    Past Surgical History:  Procedure Laterality Date   ABDOMINAL HYSTERECTOMY     And Removed 1 ovary   APPENDECTOMY     CHOLECYSTECTOMY  05/2007   TONSILLECTOMY     TOTAL HIP ARTHROPLASTY Right 06/09/2015   TOTAL HIP ARTHROPLASTY Right 06/09/2015   Procedure: RIGHT TOTAL HIP ARTHROPLASTY ANTERIOR APPROACH;  Surgeon: [name], MD;  Location: [facility];  Service: Orthopedics;  Laterality: Right;   TUBAL LIGATION     Patient Active Problem List   Diagnosis Date Noted   Neck pain, acute 01/11/2022   Pain of both hip joints 08/19/2018   Chronic left-sided low back pain with left-sided sciatica 08/19/2018   Osteoarthritis of right hip 06/09/2015   Hip joint replacement status 06/09/2015   Chronic urticaria 11/23/2014   Other allergic rhinitis 11/23/2014   Asthma with COPD 11/23/2014   Hormone replacement therapy (postmenopausal) 08/27/2013   Subclinical hypothyroidism  08/27/2013   Hyperglycemia 08/27/2013   Cystocele    Hypertension    Depression    History of Helicobacter pylori infection    Vitamin D deficiency    COPD (chronic obstructive pulmonary disease) ([facility])     REFERRING DIAG: M54.2 (ICD-10-CM) - Neck pain    THERAPY DIAG: Neck pain    Rationale for Evaluation and Treatment Rehabilitation  PERTINENT HISTORY: Patient is a 77 year old Caucasian female who presents today for complete physical exam. She has a history of diabetes mellitus currently treated with metformin. She is not on a statin despite having an elevated LDL cholesterol. We had a long discussion today and I strongly recommended statin for cardiovascular prevention however she is hesitant to start a statin. She is also on estrogen for hot flashes. She states that she has been unable to stop taking estrogen due to the severity of the hot flashes. We discussed the risk of breast cancer and stroke and she is willing to continue the medication although she will try to start weaning herself down. She is also complaining of significant pain and stiffness in her neck. She has pain bilaterally in the trapezius muscles of the neck. There is no tenderness to palpation of the spinous processes of the transverse processes. She denies any numbness  or tingling or weakness in her arms. Flexeril has helped some. She is due shot, COVID booster, and an RSV vaccine. Her pneumonia vaccine is up-to-date. She is also due for shingles shot. She is overdue for mammogram. Her colonoscopy is up-to-date.   PRECAUTIONS: none  SUBJECTIVE:                                                                                                                                                                                      SUBJECTIVE STATEMENT:  Reports an exacerbation in R side symptoms since 02/03/22, awoke with R sided spasm    PAIN:  Are you having pain? Yes: NPRS scale: 10/10 Pain location: R side neck Pain  description: spasm Aggravating factors: ROM  Relieving factors: medication    OBJECTIVE: (objective measures completed at initial evaluation unless otherwise dated)   DIAGNOSTIC FINDINGS:  CLINICAL DATA:  Acute neck pain and stiffness, no known injury   EXAM: CERVICAL SPINE - COMPLETE 4+ VIEW   COMPARISON:  None Available.   FINDINGS: No fracture or static subluxation of the cervical spine. Normal cervical lordosis. Minimal disc space height loss and osteophytosis. Asymmetric right-sided cervical facet degenerative change with right-sided bony neural foraminal stenosis at C3 through C6. The skull base, cervical soft tissues, and upper chest are unremarkable.   IMPRESSION: 1.  No fracture or static subluxation of the cervical spine.   2.  Minimal disc space height loss and osteophytosis.   3. Asymmetric right-sided cervical facet degenerative change with right-sided bony neural foraminal stenosis at C3 through C6.   4. Cervical disc and neural foraminal pathology may be further evaluated by MRI if indicated by neurologically localizing signs and symptoms.     Electronically Signed   By: Delanna Ahmadi M.D.   On: 01/15/2022 14:32   PATIENT SURVEYS:  FOTO 35(55 predicted)   COGNITION: Overall cognitive status: Within functional limits for tasks assessed   SENSATION: Not tested   POSTURE: rounded shoulders, forward head, and elevated R shoulder   PALPATION: TTP B upper traps, suboccipitals and cervical paraspinals         CERVICAL ROM:    Active ROM A/PROM (deg) eval  Flexion 75%  Extension 50%  Right lateral flexion 25%  Left lateral flexion 10%  Right rotation 25%  Left rotation 50%   (Blank rows = not tested)   UPPER EXTREMITY ROM: WFL throughout   Active ROM Right eval Left eval  Shoulder flexion      Shoulder extension      Shoulder abduction      Shoulder adduction      Shoulder extension  Shoulder internal rotation      Shoulder  external rotation      Elbow flexion      Elbow extension      Wrist flexion      Wrist extension      Wrist ulnar deviation      Wrist radial deviation      Wrist pronation      Wrist supination       (Blank rows = not tested)   UPPER EXTREMITY MMT: [facility]    MMT Right eval Left eval  Shoulder flexion      Shoulder extension      Shoulder abduction      Shoulder adduction      Shoulder extension      Shoulder internal rotation      Shoulder external rotation      Middle trapezius      Lower trapezius      Elbow flexion      Elbow extension      Wrist flexion      Wrist extension      Wrist ulnar deviation      Wrist radial deviation      Wrist pronation      Wrist supination      Grip strength       (Blank rows = not tested)   CERVICAL SPECIAL TESTS:  Neck flexor muscle endurance test: Positive 6s duration   FUNCTIONAL TESTS:  Deferred    TODAY'S TREATMENT:        [facility] Adult PT Treatment:                                                DATE: 02/05/22 Therapeutic Exercise: Nustep L1 6 min Supine chest press 15x Supine OH flexion with inspiration 15 Chin tucks over 1/2 roll 10x  Chin tuck with rotation 10x2 each direction Manual Therapy: SO mobilization to tolerance. traction and OA                                                                                                                       DATE: 02/26/21 Eval and HEP   PATIENT EDUCATION:  Education details: Discussed eval findings, rehab rationale and POC and patient is in agreement  Person educated: Patient Education method: Explanation Education comprehension: verbalized understanding and needs further education   HOME EXERCISE PROGRAM: Access Code: [email or web address] URL: [email or web address] Date: 01/26/2022 Prepared by: Sharlynn Oliphant   Exercises - Seated Shoulder Shrug Circles AROM Forward  - 3 x daily - 5 x weekly - 1 sets - 10 reps - Seated Scapular Retraction  - 3 x daily - 5 x  weekly - 1 sets - 10 reps - Seated Cervical Retraction  - 3 x daily - 5 x weekly - 1 sets - 10 reps   ASSESSMENT:   CLINICAL IMPRESSION:  Returns to PT with c/o increased symptoms in R side of neck with obvious guarding.  Todays session addressed SO tightness and suspected R OA dysfunction causing HA's.  Added tasks to improve posture and cervical mobility with noted decrease in symptoms following manual techniques.    OBJECTIVE IMPAIRMENTS: decreased knowledge of condition, decreased mobility, decreased ROM, increased muscle spasms, postural dysfunction, obesity, and pain.    ACTIVITY LIMITATIONS: carrying, lifting, and reach over head   PERSONAL FACTORS: Age, Fitness, and 1 comorbidity: COPD  are also affecting patient's functional outcome.    REHAB POTENTIAL: Good   CLINICAL DECISION MAKING: Stable/uncomplicated   EVALUATION COMPLEXITY: Low     GOALS: Goals reviewed with patient? No   SHORT TERM GOALS: Target date: 02/09/2022     Patient to demonstrate independence in HEP  Baseline: [id number] Goal status: INITIAL   2.  Decrease pain to 6/10 at worst Baseline: 8/10 Goal status: INITIAL       LONG TERM GOALS: Target date: 02/23/2022     Decrease worst pain to 4/10 Baseline: 8/10 Goal status: INITIAL   2.  Patient to demo increased cervical ROM to 50% B side bending and R rotation Baseline:  Active ROM A/PROM (deg) eval  Flexion 75%  Extension 50%  Right lateral flexion 25%  Left lateral flexion 10%  Right rotation 25%  Left rotation 50%    Goal status: INITIAL   3.  Increase FOTO score to 55 Baseline: 35 Goal status: INITIAL   4.  Increase deep neck flexor endurance to 20s Baseline: 6s due to fatigue and pain Goal status: INITIAL         PLAN:   PT FREQUENCY: 2x/week   PT DURATION: 4 weeks   PLANNED INTERVENTIONS: Therapeutic exercises, Therapeutic activity, Neuromuscular re-education, Balance training, Gait training, Patient/Family education,  Self Care, Joint mobilization, Dry Needling, Spinal mobilization, Manual therapy, and Re-evaluation   PLAN FOR NEXT SESSION: HEP review and update, posture training, ROM and stretching tasks, manual techniques as indicated, further assessment of upper cervical dysfunction   Lanice Shirts, PT 02/05/2022, 10:45 AM

## 2022-02-01 NOTE — Telephone Encounter (Signed)
Prescription Request  02/01/2022  Is this a "Controlled Substance" medicine? No  LOV: 01/19/22  What is the name of the medication or equipment? levocetirizine (XYZAL) 5 MG tablet [[id number]]   Have you contacted your pharmacy to request a refill? Yes   Which pharmacy would you like this sent to?  [id number]Phone: [phone]: [phone]   Patient notified that their request is being sent to the clinical staff for review and that they should receive a response within 2 business days.   Please advise at [place]

## 2022-02-01 NOTE — Telephone Encounter (Signed)
Requested Prescriptions  Pending Prescriptions Disp Refills   levocetirizine (XYZAL) 5 MG tablet 90 tablet 1    Sig: TAKE 1 TABLET BY MOUTH EVERY DAY IN THE EVENING     Ear, Nose, and Throat:  Antihistamines - levocetirizine dihydrochloride Failed - 02/01/2022  2:22 PM      Failed - Valid encounter within last 12 months    Recent Outpatient Visits           1 year ago Diabetes mellitus type 2 with complications ([place])   [place] [name], MD   2 years ago Diabetes mellitus type 2 with complications ([place])   [place] Pickard, Cammie Mcgee, MD   2 years ago Elevated liver enzymes   [place] [facility] [facility], Cammie Mcgee, MD   3 years ago Subclinical hypothyroidism   [place] [name], MD   3 years ago Right-sided low back pain without sciatica, unspecified chronicity   [place] Pickard, Cammie Mcgee, MD              Passed - Cr in normal range and within 360 days    Creat  Date Value Ref Range Status  01/19/2022 0.99 0.60 - 1.00 mg/dL Final   Creatinine, Urine  Date Value Ref Range Status  01/19/2022 259 20 - 275 mg/dL Final         Passed - eGFR is 10 or above and within 360 days    GFR, Est African American  Date Value Ref Range Status  12/04/2019 89 > OR = 60 mL/min/1.48m Final   GFR, Est Non African American  Date Value Ref Range Status  12/04/2019 77 > OR = 60 mL/min/1.726mFinal   eGFR  Date Value Ref Range Status  01/19/2022 60 > OR = 60 mL/min/1.7336minal

## 2022-02-05 ENCOUNTER — Ambulatory Visit: Payer: PPO | Attending: Family Medicine

## 2022-02-05 DIAGNOSIS — M542 Cervicalgia: Secondary | ICD-10-CM | POA: Diagnosis not present

## 2022-02-05 DIAGNOSIS — R293 Abnormal posture: Secondary | ICD-10-CM

## 2022-02-07 ENCOUNTER — Ambulatory Visit: Payer: PPO

## 2022-02-07 DIAGNOSIS — M542 Cervicalgia: Secondary | ICD-10-CM

## 2022-02-07 DIAGNOSIS — R293 Abnormal posture: Secondary | ICD-10-CM

## 2022-02-07 NOTE — Therapy (Signed)
OUTPATIENT PHYSICAL THERAPY TREATMENT NOTE   Patient Name: Amanda Pope MRN: [id number] DOB:[birth year], 77 y.o., female Today's Date: 02/07/2022  PCP: [name], MD   REFERRING PROVIDER: [name], MD    END OF SESSION:   PT End of Session - 02/07/22 1216     Visit Number 3    Number of Visits 8    Date for PT Re-Evaluation 03/23/22    Authorization Type humana    PT Start Time 1215    PT Stop Time 1255    PT Time Calculation (min) 40 min    Activity Tolerance Patient tolerated treatment well;Patient limited by pain    Behavior During Therapy [facility] for tasks assessed/performed             Past Medical History:  Diagnosis Date   Arthritis    hip & back   Asthma    COPD (chronic obstructive pulmonary disease) ([place])    Cystocele    Depression    Diabetes mellitus without complication ([place])    Phreesia 05/27/2020   GERD (gastroesophageal reflux disease)    History of Helicobacter pylori infection    Hypertension    Shortness of breath dyspnea    Urticaria    Vitamin D deficiency    Past Surgical History:  Procedure Laterality Date   ABDOMINAL HYSTERECTOMY     And Removed 1 ovary   APPENDECTOMY     CHOLECYSTECTOMY  05/2007   TONSILLECTOMY     TOTAL HIP ARTHROPLASTY Right 06/09/2015   TOTAL HIP ARTHROPLASTY Right 06/09/2015   Procedure: RIGHT TOTAL HIP ARTHROPLASTY ANTERIOR APPROACH;  Surgeon: [name], MD;  Location: [place];  Service: Orthopedics;  Laterality: Right;   TUBAL LIGATION     Patient Active Problem List   Diagnosis Date Noted   Neck pain, acute 01/11/2022   Pain of both hip joints 08/19/2018   Chronic left-sided low back pain with left-sided sciatica 08/19/2018   Osteoarthritis of right hip 06/09/2015   Hip joint replacement status 06/09/2015   Chronic urticaria 11/23/2014   Other allergic rhinitis 11/23/2014   Asthma with COPD 11/23/2014   Hormone replacement therapy (postmenopausal) 08/27/2013   Subclinical hypothyroidism  08/27/2013   Hyperglycemia 08/27/2013   Cystocele    Hypertension    Depression    History of Helicobacter pylori infection    Vitamin D deficiency    COPD (chronic obstructive pulmonary disease) (HCC)     REFERRING DIAG: M54.2 (ICD-10-CM) - Neck pain    THERAPY DIAG: Neck pain    Rationale for Evaluation and Treatment Rehabilitation  PERTINENT HISTORY: Patient is a 77 year old Caucasian female who presents today for complete physical exam. She has a history of diabetes mellitus currently treated with metformin. She is not on a statin despite having an elevated LDL cholesterol. We had a long discussion today and I strongly recommended statin for cardiovascular prevention however she is hesitant to start a statin. She is also on estrogen for hot flashes. She states that she has been unable to stop taking estrogen due to the severity of the hot flashes. We discussed the risk of breast cancer and stroke and she is willing to continue the medication although she will try to start weaning herself down. She is also complaining of significant pain and stiffness in her neck. She has pain bilaterally in the trapezius muscles of the neck. There is no tenderness to palpation of the spinous processes of the transverse processes. She denies any numbness  or tingling or weakness in her arms. Flexeril has helped some. She is due shot, COVID booster, and an RSV vaccine. Her pneumonia vaccine is up-to-date. She is also due for shingles shot. She is overdue for mammogram. Her colonoscopy is up-to-date.   PRECAUTIONS: none  SUBJECTIVE:                                                                                                                                                                                      SUBJECTIVE STATEMENT:  Reports an exacerbation in R side symptoms since 02/03/22, awoke with R sided spasm    PAIN:  Are you having pain? Yes: NPRS scale: 10/10 Pain location: R side neck Pain  description: spasm Aggravating factors: ROM  Relieving factors: medication    OBJECTIVE: (objective measures completed at initial evaluation unless otherwise dated)   DIAGNOSTIC FINDINGS:  CLINICAL DATA:  Acute neck pain and stiffness, no known injury   EXAM: CERVICAL SPINE - COMPLETE 4+ VIEW   COMPARISON:  None Available.   FINDINGS: No fracture or static subluxation of the cervical spine. Normal cervical lordosis. Minimal disc space height loss and osteophytosis. Asymmetric right-sided cervical facet degenerative change with right-sided bony neural foraminal stenosis at C3 through C6. The skull base, cervical soft tissues, and upper chest are unremarkable.   IMPRESSION: 1.  No fracture or static subluxation of the cervical spine.   2.  Minimal disc space height loss and osteophytosis.   3. Asymmetric right-sided cervical facet degenerative change with right-sided bony neural foraminal stenosis at C3 through C6.   4. Cervical disc and neural foraminal pathology may be further evaluated by MRI if indicated by neurologically localizing signs and symptoms.     Electronically Signed   By: Delanna Ahmadi M.D.   On: 01/15/2022 14:32   PATIENT SURVEYS:  FOTO 35(55 predicted)   COGNITION: Overall cognitive status: Within functional limits for tasks assessed   SENSATION: Not tested   POSTURE: rounded shoulders, forward head, and elevated R shoulder   PALPATION: TTP B upper traps, suboccipitals and cervical paraspinals         CERVICAL ROM:    Active ROM A/PROM (deg) eval  Flexion 75%  Extension 50%  Right lateral flexion 25%  Left lateral flexion 10%  Right rotation 25%  Left rotation 50%   (Blank rows = not tested)   UPPER EXTREMITY ROM: WFL throughout   Active ROM Right eval Left eval  Shoulder flexion      Shoulder extension      Shoulder abduction      Shoulder adduction      Shoulder extension  Shoulder internal rotation      Shoulder  external rotation      Elbow flexion      Elbow extension      Wrist flexion      Wrist extension      Wrist ulnar deviation      Wrist radial deviation      Wrist pronation      Wrist supination       (Blank rows = not tested)   UPPER EXTREMITY MMT: [facility]    MMT Right eval Left eval  Shoulder flexion      Shoulder extension      Shoulder abduction      Shoulder adduction      Shoulder extension      Shoulder internal rotation      Shoulder external rotation      Middle trapezius      Lower trapezius      Elbow flexion      Elbow extension      Wrist flexion      Wrist extension      Wrist ulnar deviation      Wrist radial deviation      Wrist pronation      Wrist supination      Grip strength       (Blank rows = not tested)   CERVICAL SPECIAL TESTS:  Neck flexor muscle endurance test: Positive 6s duration   FUNCTIONAL TESTS:  Deferred    TODAY'S TREATMENT:        [facility] Adult PT Treatment:                                                DATE: 02/07/22 Therapeutic Exercise: Nustep L2 6 min Supine chest press 15x 2# Supine OH flexion with inspiration 15 2# Seated rows YTB 15x Seated shoulder extension YTB 15x Chin tucks over 1/2 roll 10x2 Chin tuck with rotation 10x2 each direction Manual Therapy: SO musculature mobilization to tolerance, traction and OA, STM to R scalene group  [facility] Adult PT Treatment:                                                DATE: 02/05/22 Therapeutic Exercise: Nustep L1 6 min Supine chest press 15x Supine OH flexion with inspiration 15 Chin tucks over 1/2 roll 15x Chin tuck with rotation 15x each direction Manual Therapy: SO musculature mobilization to tolerance, traction and OA                                                                                                                       DATE: 02/26/21 Eval and HEP   PATIENT EDUCATION:  Education details: Discussed eval findings, rehab rationale  and POC and patient is in  agreement  Person educated: Patient Education method: Explanation Education comprehension: verbalized understanding and needs further education   HOME EXERCISE PROGRAM: Access Code: [email or web address] URL: [email or web address] Date: 01/26/2022 Prepared by: Sharlynn Oliphant   Exercises - Seated Shoulder Shrug Circles AROM Forward  - 3 x daily - 5 x weekly - 1 sets - 10 reps - Seated Scapular Retraction  - 3 x daily - 5 x weekly - 1 sets - 10 reps - Seated Cervical Retraction  - 3 x daily - 5 x weekly - 1 sets - 10 reps   ASSESSMENT:   CLINICAL IMPRESSION: Continued pain and guarding through cervical region, sleep positions still painful.  Continued posture retraining and added resistance to exercises today.  Cervical rotation remains restricted primarily.  STM to R scalene group to release tension.  Painful when transitioning supine/sit.  Soft tissue guarding limits ability to assess joint function in upper cervical spine.    OBJECTIVE IMPAIRMENTS: decreased knowledge of condition, decreased mobility, decreased ROM, increased muscle spasms, postural dysfunction, obesity, and pain.    ACTIVITY LIMITATIONS: carrying, lifting, and reach over head   PERSONAL FACTORS: Age, Fitness, and 1 comorbidity: COPD  are also affecting patient's functional outcome.    REHAB POTENTIAL: Good   CLINICAL DECISION MAKING: Stable/uncomplicated   EVALUATION COMPLEXITY: Low     GOALS: Goals reviewed with patient? No   SHORT TERM GOALS: Target date: 02/09/2022     Patient to demonstrate independence in HEP  Baseline: [id number] Goal status: Met   2.  Decrease pain to 6/10 at worst Baseline: 8/10 Goal status: INITIAL       LONG TERM GOALS: Target date: 02/23/2022     Decrease worst pain to 4/10 Baseline: 8/10 Goal status: INITIAL   2.  Patient to demo increased cervical ROM to 50% B side bending and R rotation Baseline:  Active ROM A/PROM (deg) eval  Flexion 75%  Extension 50%   Right lateral flexion 25%  Left lateral flexion 10%  Right rotation 25%  Left rotation 50%    Goal status: INITIAL   3.  Increase FOTO score to 55 Baseline: 35 Goal status: INITIAL   4.  Increase deep neck flexor endurance to 20s Baseline: 6s due to fatigue and pain Goal status: INITIAL         PLAN:   PT FREQUENCY: 2x/week   PT DURATION: 4 weeks   PLANNED INTERVENTIONS: Therapeutic exercises, Therapeutic activity, Neuromuscular re-education, Balance training, Gait training, Patient/Family education, Self Care, Joint mobilization, Dry Needling, Spinal mobilization, Manual therapy, and Re-evaluation   PLAN FOR NEXT SESSION: HEP review and update, posture training, ROM and stretching tasks, manual techniques as indicated, further assessment of upper cervical dysfunction   Lanice Shirts, PT 02/07/2022, 1:14 PM

## 2022-02-13 ENCOUNTER — Ambulatory Visit: Payer: PPO

## 2022-02-13 DIAGNOSIS — R293 Abnormal posture: Secondary | ICD-10-CM

## 2022-02-13 DIAGNOSIS — M542 Cervicalgia: Secondary | ICD-10-CM

## 2022-02-13 NOTE — Therapy (Signed)
OUTPATIENT PHYSICAL THERAPY TREATMENT NOTE   Patient Name: Amanda Pope MRN: [id number] DOB:1945/02/21, 77 y.o., female Today's Date: 02/13/2022  PCP: [name], MD   REFERRING PROVIDER: [name], MD    END OF SESSION:   PT End of Session - 02/13/22 1314     Visit Number 4    Number of Visits 8    Date for PT Re-Evaluation 03/23/22    Authorization Type humana    PT Start Time 1315    PT Stop Time 1355    PT Time Calculation (min) 40 min    Activity Tolerance Patient tolerated treatment well;Patient limited by pain    Behavior During Therapy [facility] for tasks assessed/performed             Past Medical History:  Diagnosis Date   Arthritis    hip & back   Asthma    COPD (chronic obstructive pulmonary disease) ([place])    Cystocele    Depression    Diabetes mellitus without complication ([place])    Phreesia 05/27/2020   GERD (gastroesophageal reflux disease)    History of Helicobacter pylori infection    Hypertension    Shortness of breath dyspnea    Urticaria    Vitamin D deficiency    Past Surgical History:  Procedure Laterality Date   ABDOMINAL HYSTERECTOMY     And Removed 1 ovary   APPENDECTOMY     CHOLECYSTECTOMY  05/2007   TONSILLECTOMY     TOTAL HIP ARTHROPLASTY Right 06/09/2015   TOTAL HIP ARTHROPLASTY Right 06/09/2015   Procedure: RIGHT TOTAL HIP ARTHROPLASTY ANTERIOR APPROACH;  Surgeon: [name], MD;  Location: [place];  Service: Orthopedics;  Laterality: Right;   TUBAL LIGATION     Patient Active Problem List   Diagnosis Date Noted   Neck pain, acute 01/11/2022   Pain of both hip joints 08/19/2018   Chronic left-sided low back pain with left-sided sciatica 08/19/2018   Osteoarthritis of right hip 06/09/2015   Hip joint replacement status 06/09/2015   Chronic urticaria 11/23/2014   Other allergic rhinitis 11/23/2014   Asthma with COPD 11/23/2014   Hormone replacement therapy (postmenopausal) 08/27/2013   Subclinical hypothyroidism  08/27/2013   Hyperglycemia 08/27/2013   Cystocele    Hypertension    Depression    History of Helicobacter pylori infection    Vitamin D deficiency    COPD (chronic obstructive pulmonary disease) (HCC)     REFERRING DIAG: M54.2 (ICD-10-CM) - Neck pain    THERAPY DIAG: Neck pain    Rationale for Evaluation and Treatment Rehabilitation  PERTINENT HISTORY: Patient is a 76 year old Caucasian female who presents today for complete physical exam. She has a history of diabetes mellitus currently treated with metformin. She is not on a statin despite having an elevated LDL cholesterol. We had a long discussion today and I strongly recommended statin for cardiovascular prevention however she is hesitant to start a statin. She is also on estrogen for hot flashes. She states that she has been unable to stop taking estrogen due to the severity of the hot flashes. We discussed the risk of breast cancer and stroke and she is willing to continue the medication although she will try to start weaning herself down. She is also complaining of significant pain and stiffness in her neck. She has pain bilaterally in the trapezius muscles of the neck. There is no tenderness to palpation of the spinous processes of the transverse processes. She denies any numbness  or tingling or weakness in her arms. Flexeril has helped some. She is due shot, COVID booster, and an RSV vaccine. Her pneumonia vaccine is up-to-date. She is also due for shingles shot. She is overdue for mammogram. Her colonoscopy is up-to-date.   PRECAUTIONS: none  SUBJECTIVE:                                                                                                                                                                                      SUBJECTIVE STATEMENT:  Has had several good days and has not ha as much pain since 02/09/22   PAIN:  Are you having pain? Yes: NPRS scale: 10/10 Pain location: R side neck Pain description:  spasm Aggravating factors: ROM  Relieving factors: medication    OBJECTIVE: (objective measures completed at initial evaluation unless otherwise dated)   DIAGNOSTIC FINDINGS:  CLINICAL DATA:  Acute neck pain and stiffness, no known injury   EXAM: CERVICAL SPINE - COMPLETE 4+ VIEW   COMPARISON:  None Available.   FINDINGS: No fracture or static subluxation of the cervical spine. Normal cervical lordosis. Minimal disc space height loss and osteophytosis. Asymmetric right-sided cervical facet degenerative change with right-sided bony neural foraminal stenosis at C3 through C6. The skull base, cervical soft tissues, and upper chest are unremarkable.   IMPRESSION: 1.  No fracture or static subluxation of the cervical spine.   2.  Minimal disc space height loss and osteophytosis.   3. Asymmetric right-sided cervical facet degenerative change with right-sided bony neural foraminal stenosis at C3 through C6.   4. Cervical disc and neural foraminal pathology may be further evaluated by MRI if indicated by neurologically localizing signs and symptoms.     Electronically Signed   By: Delanna Ahmadi M.D.   On: 01/15/2022 14:32   PATIENT SURVEYS:  FOTO 35(55 predicted)   COGNITION: Overall cognitive status: Within functional limits for tasks assessed   SENSATION: Not tested   POSTURE: rounded shoulders, forward head, and elevated R shoulder   PALPATION: TTP B upper traps, suboccipitals and cervical paraspinals         CERVICAL ROM:    Active ROM A/PROM (deg) eval AROM 02/13/22  Flexion 75% 75%  Extension 50% 50%  Right lateral flexion 25% 50%  Left lateral flexion 10% 25%  Right rotation 25% 25%  Left rotation 50% 75%   (Blank rows = not tested)   UPPER EXTREMITY ROM: WFL throughout   Active ROM Right eval Left eval  Shoulder flexion      Shoulder extension      Shoulder abduction      Shoulder adduction  Shoulder extension      Shoulder internal  rotation      Shoulder external rotation      Elbow flexion      Elbow extension      Wrist flexion      Wrist extension      Wrist ulnar deviation      Wrist radial deviation      Wrist pronation      Wrist supination       (Blank rows = not tested)   UPPER EXTREMITY MMT: [facility]    MMT Right eval Left eval  Shoulder flexion      Shoulder extension      Shoulder abduction      Shoulder adduction      Shoulder extension      Shoulder internal rotation      Shoulder external rotation      Middle trapezius      Lower trapezius      Elbow flexion      Elbow extension      Wrist flexion      Wrist extension      Wrist ulnar deviation      Wrist radial deviation      Wrist pronation      Wrist supination      Grip strength       (Blank rows = not tested)   CERVICAL SPECIAL TESTS:  Neck flexor muscle endurance test: Positive 6s duration   FUNCTIONAL TESTS:  Deferred    TODAY'S TREATMENT:    [facility] Adult PT Treatment:                                                DATE: 02/13/22 Therapeutic Exercise: Nustep L2 6 min Supine chest press/protraction 15x 3# Supine OH flexion with inspiration 15 3# Supine hor abd YTB 15x Seated rows YTB 15x Seated shoulder extension YTB 15x Chin tucks over 1/2 roll 10x2 Chin tuck with rotation 10x2 each direction Manual Therapy: R scalene stretching 30s x3 R OA mobs 3x10 f/b gentle traction     [facility] Adult PT Treatment:                                                DATE: 02/07/22 Therapeutic Exercise: Nustep L2 6 min Supine chest press 15x 2# Supine OH flexion with inspiration 15 2# Seated rows YTB 15x Seated shoulder extension YTB 15x Chin tucks over 1/2 roll 10x2 Chin tuck with rotation 10x2 each direction Manual Therapy: SO musculature mobilization to tolerance, traction and OA, STM to R scalene group  [facility] Adult PT Treatment:                                                DATE: 02/05/22 Therapeutic Exercise: Nustep L1 6  min Supine chest press 15x Supine OH flexion with inspiration 15 Chin tucks over 1/2 roll 15x Chin tuck with rotation 15x each direction Manual Therapy: SO musculature mobilization to tolerance, traction and OA  DATE: 02/26/21 Eval and HEP   PATIENT EDUCATION:  Education details: Discussed eval findings, rehab rationale and POC and patient is in agreement  Person educated: Patient Education method: Explanation Education comprehension: verbalized understanding and needs further education   HOME EXERCISE PROGRAM: Access Code: [email or web address] URL: [email or web address] Date: 01/26/2022 Prepared by: Sharlynn Oliphant   Exercises - Seated Shoulder Shrug Circles AROM Forward  - 3 x daily - 5 x weekly - 1 sets - 10 reps - Seated Scapular Retraction  - 3 x daily - 5 x weekly - 1 sets - 10 reps - Seated Cervical Retraction  - 3 x daily - 5 x weekly - 1 sets - 10 reps   ASSESSMENT:   CLINICAL IMPRESSION: Symptoms have decreased over the past several days and mobility has increased as noted.  Increased resistance on tasks as noted.  Focus placed on R side of neck incorporating scalene stretching and R OA mobilization.  AROM improved as noted.     OBJECTIVE IMPAIRMENTS: decreased knowledge of condition, decreased mobility, decreased ROM, increased muscle spasms, postural dysfunction, obesity, and pain.    ACTIVITY LIMITATIONS: carrying, lifting, and reach over head   PERSONAL FACTORS: Age, Fitness, and 1 comorbidity: COPD  are also affecting patient's functional outcome.    REHAB POTENTIAL: Good   CLINICAL DECISION MAKING: Stable/uncomplicated   EVALUATION COMPLEXITY: Low     GOALS: Goals reviewed with patient? No   SHORT TERM GOALS: Target date: 02/09/2022     Patient to demonstrate independence in HEP  Baseline: [id number] Goal status: Met   2.  Decrease  pain to 6/10 at worst Baseline: 8/10 Goal status: INITIAL       LONG TERM GOALS: Target date: 02/23/2022     Decrease worst pain to 4/10 Baseline: 8/10 Goal status: INITIAL   2.  Patient to demo increased cervical ROM to 50% B side bending and R rotation Baseline:  Active ROM A/PROM (deg) eval  Flexion 75%  Extension 50%  Right lateral flexion 25%  Left lateral flexion 10%  Right rotation 25%  Left rotation 50%    Goal status: INITIAL   3.  Increase FOTO score to 55 Baseline: 35 Goal status: INITIAL   4.  Increase deep neck flexor endurance to 20s Baseline: 6s due to fatigue and pain Goal status: INITIAL         PLAN:   PT FREQUENCY: 2x/week   PT DURATION: 4 weeks   PLANNED INTERVENTIONS: Therapeutic exercises, Therapeutic activity, Neuromuscular re-education, Balance training, Gait training, Patient/Family education, Self Care, Joint mobilization, Dry Needling, Spinal mobilization, Manual therapy, and Re-evaluation   PLAN FOR NEXT SESSION: HEP review and update, posture training, ROM and stretching tasks, manual techniques as indicated, further assessment of upper cervical dysfunction   Lanice Shirts, PT 02/13/2022, 1:56 PM

## 2022-02-14 DIAGNOSIS — H35033 Hypertensive retinopathy, bilateral: Secondary | ICD-10-CM | POA: Diagnosis not present

## 2022-02-14 DIAGNOSIS — E119 Type 2 diabetes mellitus without complications: Secondary | ICD-10-CM | POA: Diagnosis not present

## 2022-02-14 DIAGNOSIS — H26493 Other secondary cataract, bilateral: Secondary | ICD-10-CM | POA: Diagnosis not present

## 2022-02-14 DIAGNOSIS — I10 Essential (primary) hypertension: Secondary | ICD-10-CM | POA: Diagnosis not present

## 2022-02-14 LAB — HM DIABETES EYE EXAM

## 2022-02-15 ENCOUNTER — Ambulatory Visit: Payer: PPO

## 2022-02-15 DIAGNOSIS — M542 Cervicalgia: Secondary | ICD-10-CM

## 2022-02-15 DIAGNOSIS — R293 Abnormal posture: Secondary | ICD-10-CM

## 2022-02-15 NOTE — Therapy (Signed)
OUTPATIENT PHYSICAL THERAPY TREATMENT NOTE   Patient Name: Amanda Pope MRN: [id number] DOB:[birth year], 77 y.o., female Today's Date: 02/15/2022  PCP: Amanda Frizzle, MD   REFERRING PROVIDER: [name], MD    END OF SESSION:   PT End of Session - 02/15/22 1315     Visit Number 5    Number of Visits 8    Date for PT Re-Evaluation 03/23/22    Authorization Type humana    Activity Tolerance Patient tolerated treatment well;Patient limited by pain    Behavior During Therapy [facility] for tasks assessed/performed             Past Medical History:  Diagnosis Date   Arthritis    hip & back   Asthma    COPD (chronic obstructive pulmonary disease) ([place])    Cystocele    Depression    Diabetes mellitus without complication ([place])    Phreesia 05/27/2020   GERD (gastroesophageal reflux disease)    History of Helicobacter pylori infection    Hypertension    Shortness of breath dyspnea    Urticaria    Vitamin D deficiency    Past Surgical History:  Procedure Laterality Date   ABDOMINAL HYSTERECTOMY     And Removed 1 ovary   APPENDECTOMY     CHOLECYSTECTOMY  05/2007   TONSILLECTOMY     TOTAL HIP ARTHROPLASTY Right 06/09/2015   TOTAL HIP ARTHROPLASTY Right 06/09/2015   Procedure: RIGHT TOTAL HIP ARTHROPLASTY ANTERIOR APPROACH;  Surgeon: [name], MD;  Location: [place];  Service: Orthopedics;  Laterality: Right;   TUBAL LIGATION     Patient Active Problem List   Diagnosis Date Noted   Neck pain, acute 01/11/2022   Pain of both hip joints 08/19/2018   Chronic left-sided low back pain with left-sided sciatica 08/19/2018   Osteoarthritis of right hip 06/09/2015   Hip joint replacement status 06/09/2015   Chronic urticaria 11/23/2014   Other allergic rhinitis 11/23/2014   Asthma with COPD 11/23/2014   Hormone replacement therapy (postmenopausal) 08/27/2013   Subclinical hypothyroidism 08/27/2013   Hyperglycemia 08/27/2013   Cystocele    Hypertension     Depression    History of Helicobacter pylori infection    Vitamin D deficiency    COPD (chronic obstructive pulmonary disease) (HCC)     REFERRING DIAG: M54.2 (ICD-10-CM) - Neck pain    THERAPY DIAG: Neck pain    Rationale for Evaluation and Treatment Rehabilitation  PERTINENT HISTORY: Patient is a 77 year old Caucasian female who presents today for complete physical exam. She has a history of diabetes mellitus currently treated with metformin. She is not on a statin despite having an elevated LDL cholesterol. We had a long discussion today and I strongly recommended statin for cardiovascular prevention however she is hesitant to start a statin. She is also on estrogen for hot flashes. She states that she has been unable to stop taking estrogen due to the severity of the hot flashes. We discussed the risk of breast cancer and stroke and she is willing to continue the medication although she will try to start weaning herself down. She is also complaining of significant pain and stiffness in her neck. She has pain bilaterally in the trapezius muscles of the neck. There is no tenderness to palpation of the spinous processes of the transverse processes. She denies any numbness or tingling or weakness in her arms. Flexeril has helped some. She is due shot, COVID booster, and an RSV vaccine. Her pneumonia  vaccine is up-to-date. She is also due for shingles shot. She is overdue for mammogram. Her colonoscopy is up-to-date.   PRECAUTIONS: none  SUBJECTIVE:                                                                                                                                                                                      SUBJECTIVE STATEMENT:  Continues to note improvements in pain and mobility.  Feels good enough to get her hair done.  Has been using heat at home.   PAIN:  Are you having pain? Yes: NPRS scale: 10/10 Pain location: R side neck Pain description: spasm Aggravating  factors: ROM  Relieving factors: medication    OBJECTIVE: (objective measures completed at initial evaluation unless otherwise dated)   DIAGNOSTIC FINDINGS:  CLINICAL DATA:  Acute neck pain and stiffness, no known injury   EXAM: CERVICAL SPINE - COMPLETE 4+ VIEW   COMPARISON:  None Available.   FINDINGS: No fracture or static subluxation of the cervical spine. Normal cervical lordosis. Minimal disc space height loss and osteophytosis. Asymmetric right-sided cervical facet degenerative change with right-sided bony neural foraminal stenosis at C3 through C6. The skull base, cervical soft tissues, and upper chest are unremarkable.   IMPRESSION: 1.  No fracture or static subluxation of the cervical spine.   2.  Minimal disc space height loss and osteophytosis.   3. Asymmetric right-sided cervical facet degenerative change with right-sided bony neural foraminal stenosis at C3 through C6.   4. Cervical disc and neural foraminal pathology may be further evaluated by MRI if indicated by neurologically localizing signs and symptoms.     Electronically Signed   By: Delanna Ahmadi M.D.   On: 01/15/2022 14:32   PATIENT SURVEYS:  FOTO 35(55 predicted)   COGNITION: Overall cognitive status: Within functional limits for tasks assessed   SENSATION: Not tested   POSTURE: rounded shoulders, forward head, and elevated R shoulder   PALPATION: TTP B upper traps, suboccipitals and cervical paraspinals         CERVICAL ROM:    Active ROM A/PROM (deg) eval AROM 02/13/22  Flexion 75% 75%  Extension 50% 50%  Right lateral flexion 25% 50%  Left lateral flexion 10% 25%  Right rotation 25% 25%  Left rotation 50% 75%   (Blank rows = not tested)   UPPER EXTREMITY ROM: WFL throughout   Active ROM Right eval Left eval  Shoulder flexion      Shoulder extension      Shoulder abduction      Shoulder adduction      Shoulder extension      Shoulder internal rotation  Shoulder  external rotation      Elbow flexion      Elbow extension      Wrist flexion      Wrist extension      Wrist ulnar deviation      Wrist radial deviation      Wrist pronation      Wrist supination       (Blank rows = not tested)   UPPER EXTREMITY MMT: [facility]    MMT Right eval Left eval  Shoulder flexion      Shoulder extension      Shoulder abduction      Shoulder adduction      Shoulder extension      Shoulder internal rotation      Shoulder external rotation      Middle trapezius      Lower trapezius      Elbow flexion      Elbow extension      Wrist flexion      Wrist extension      Wrist ulnar deviation      Wrist radial deviation      Wrist pronation      Wrist supination      Grip strength       (Blank rows = not tested)   CERVICAL SPECIAL TESTS:  Neck flexor muscle endurance test: Positive 6s duration   FUNCTIONAL TESTS:  Deferred    TODAY'S TREATMENT:    [facility] Adult PT Treatment:                                                DATE: 02/15/22 Therapeutic Exercise: Nustep L3 8 min Supine chest press/protraction 15x 4# Supine OH flexion with inspiration 15 4# Supine hor abd YTB 15x2 Rows YTB 15x2 Shoulder extension YTB 15x2 Chin tuck with rotation 10x2 each direction Manual Therapy: R scalene stretching 30s x3 R OA mobs 3x10 f/b gentle traction R first rib SNAG during 10 deep breaths  [facility] Adult PT Treatment:                                                DATE: 02/13/22 Therapeutic Exercise: Nustep L2 6 min Supine chest press/protraction 15x 3# Supine OH flexion with inspiration 15 3# Supine hor abd YTB 15x Seated rows YTB 15x Seated shoulder extension YTB 15x Chin tucks over 1/2 roll 10x2 Chin tuck with rotation 10x2 each direction Manual Therapy: R scalene stretching 30s x3 R OA mobs 3x10 f/b gentle traction     [facility] Adult PT Treatment:                                                DATE: 02/07/22 Therapeutic Exercise: Nustep L2 6 min Supine  chest press 15x 2# Supine OH flexion with inspiration 15 2# Seated rows YTB 15x Seated shoulder extension YTB 15x Chin tucks over 1/2 roll 10x2 Chin tuck with rotation 10x2 each direction Manual Therapy: SO musculature mobilization to tolerance, traction and OA, STM to R scalene group  [facility] Adult PT Treatment:  DATE: 02/05/22 Therapeutic Exercise: Nustep L1 6 min Supine chest press 15x Supine OH flexion with inspiration 15 Chin tucks over 1/2 roll 15x Chin tuck with rotation 15x each direction Manual Therapy: SO musculature mobilization to tolerance, traction and OA                                                                                                                       DATE: 02/26/21 Eval and HEP   PATIENT EDUCATION:  Education details: Discussed eval findings, rehab rationale and POC and patient is in agreement  Person educated: Patient Education method: Explanation Education comprehension: verbalized understanding and needs further education   HOME EXERCISE PROGRAM: Access Code: [email or web address] URL: [email or web address] Date: 01/26/2022 Prepared by: Sharlynn Oliphant   Exercises - Seated Shoulder Shrug Circles AROM Forward  - 3 x daily - 5 x weekly - 1 sets - 10 reps - Seated Scapular Retraction  - 3 x daily - 5 x weekly - 1 sets - 10 reps - Seated Cervical Retraction  - 3 x daily - 5 x weekly - 1 sets - 10 reps   ASSESSMENT:   CLINICAL IMPRESSION: Continued reports of improved discomfort and mobility.  Still noting stiffness when waking from sleep.  Increased difficulty of tasks by adding reps, time and resistance.  Added additional R first rib mobilization and METs.  AROM cervical into R rotation now 75% of L rotation.     OBJECTIVE IMPAIRMENTS: decreased knowledge of condition, decreased mobility, decreased ROM, increased muscle spasms, postural dysfunction, obesity, and pain.    ACTIVITY LIMITATIONS:  carrying, lifting, and reach over head   PERSONAL FACTORS: Age, Fitness, and 1 comorbidity: COPD  are also affecting patient's functional outcome.    REHAB POTENTIAL: Good   CLINICAL DECISION MAKING: Stable/uncomplicated   EVALUATION COMPLEXITY: Low     GOALS: Goals reviewed with patient? No   SHORT TERM GOALS: Target date: 02/09/2022     Patient to demonstrate independence in HEP  Baseline: [id number] Goal status: Met   2.  Decrease pain to 6/10 at worst Baseline: 8/10 Goal status: INITIAL       LONG TERM GOALS: Target date: 02/23/2022     Decrease worst pain to 4/10 Baseline: 8/10 Goal status: INITIAL   2.  Patient to demo increased cervical ROM to 50% B side bending and R rotation Baseline:  Active ROM A/PROM (deg) eval  Flexion 75%  Extension 50%  Right lateral flexion 25%  Left lateral flexion 10%  Right rotation 25%  Left rotation 50%    Goal status: INITIAL   3.  Increase FOTO score to 55 Baseline: 35 Goal status: INITIAL   4.  Increase deep neck flexor endurance to 20s Baseline: 6s due to fatigue and pain Goal status: INITIAL         PLAN:   PT FREQUENCY: 2x/week   PT DURATION: 4 weeks   PLANNED INTERVENTIONS: Therapeutic exercises, Therapeutic activity, Neuromuscular re-education, Balance training, Gait training, Patient/Family education, Self  Care, Joint mobilization, Dry Needling, Spinal mobilization, Manual therapy, and Re-evaluation   PLAN FOR NEXT SESSION: HEP review and update, posture training, ROM and stretching tasks, manual techniques as indicated, further assessment of upper cervical dysfunction   Lanice Shirts, PT 02/15/2022, 1:15 PM

## 2022-02-20 ENCOUNTER — Ambulatory Visit: Payer: PPO

## 2022-02-20 DIAGNOSIS — M542 Cervicalgia: Secondary | ICD-10-CM

## 2022-02-20 DIAGNOSIS — R293 Abnormal posture: Secondary | ICD-10-CM

## 2022-02-20 NOTE — Therapy (Signed)
OUTPATIENT PHYSICAL THERAPY TREATMENT NOTE   Patient Name: Amanda Pope MRN: [id number] DOB:Apr 04, 1945, 77 y.o., female Today's Date: 02/20/2022  PCP: [name], MD   REFERRING PROVIDER: [name], MD    END OF SESSION:   PT End of Session - 02/20/22 1315     Visit Number 6    Number of Visits 8    Date for PT Re-Evaluation 03/23/22    Authorization Type humana    PT Start Time 1315    PT Stop Time 1355    PT Time Calculation (min) 40 min    Activity Tolerance Patient tolerated treatment well;Patient limited by pain    Behavior During Therapy [facility] for tasks assessed/performed             Past Medical History:  Diagnosis Date   Arthritis    hip & back   Asthma    COPD (chronic obstructive pulmonary disease) ([place])    Cystocele    Depression    Diabetes mellitus without complication ([place])    Phreesia 05/27/2020   GERD (gastroesophageal reflux disease)    History of Helicobacter pylori infection    Hypertension    Shortness of breath dyspnea    Urticaria    Vitamin D deficiency    Past Surgical History:  Procedure Laterality Date   ABDOMINAL HYSTERECTOMY     And Removed 1 ovary   APPENDECTOMY     CHOLECYSTECTOMY  05/2007   TONSILLECTOMY     TOTAL HIP ARTHROPLASTY Right 06/09/2015   TOTAL HIP ARTHROPLASTY Right 06/09/2015   Procedure: RIGHT TOTAL HIP ARTHROPLASTY ANTERIOR APPROACH;  Surgeon: [name], MD;  Location: [place];  Service: Orthopedics;  Laterality: Right;   TUBAL LIGATION     Patient Active Problem List   Diagnosis Date Noted   Neck pain, acute 01/11/2022   Pain of both hip joints 08/19/2018   Chronic left-sided low back pain with left-sided sciatica 08/19/2018   Osteoarthritis of right hip 06/09/2015   Hip joint replacement status 06/09/2015   Chronic urticaria 11/23/2014   Other allergic rhinitis 11/23/2014   Asthma with COPD 11/23/2014   Hormone replacement therapy (postmenopausal) 08/27/2013   Subclinical hypothyroidism  08/27/2013   Hyperglycemia 08/27/2013   Cystocele    Hypertension    Depression    History of Helicobacter pylori infection    Vitamin D deficiency    COPD (chronic obstructive pulmonary disease) (HCC)     REFERRING DIAG: M54.2 (ICD-10-CM) - Neck pain    THERAPY DIAG: Neck pain    Rationale for Evaluation and Treatment Rehabilitation  PERTINENT HISTORY: Patient is a 77 year old Caucasian female who presents today for complete physical exam. She has a history of diabetes mellitus currently treated with metformin. She is not on a statin despite having an elevated LDL cholesterol. We had a long discussion today and I strongly recommended statin for cardiovascular prevention however she is hesitant to start a statin. She is also on estrogen for hot flashes. She states that she has been unable to stop taking estrogen due to the severity of the hot flashes. We discussed the risk of breast cancer and stroke and she is willing to continue the medication although she will try to start weaning herself down. She is also complaining of significant pain and stiffness in her neck. She has pain bilaterally in the trapezius muscles of the neck. There is no tenderness to palpation of the spinous processes of the transverse processes. She denies any numbness  or tingling or weakness in her arms. Flexeril has helped some. She is due shot, COVID booster, and an RSV vaccine. Her pneumonia vaccine is up-to-date. She is also due for shingles shot. She is overdue for mammogram. Her colonoscopy is up-to-date.   PRECAUTIONS: none  SUBJECTIVE:                                                                                                                                                                                      SUBJECTIVE STATEMENT:  Returns with increased symptoms in R side of neck.  Denies re-injury or exacerbating factors.  Awoke with increased symptoms.  PAIN:  Are you having pain? Yes: NPRS scale:  8/10 Pain location: R side neck Pain description: spasm Aggravating factors: ROM  Relieving factors: medication    OBJECTIVE: (objective measures completed at initial evaluation unless otherwise dated)   DIAGNOSTIC FINDINGS:  CLINICAL DATA:  Acute neck pain and stiffness, no known injury   EXAM: CERVICAL SPINE - COMPLETE 4+ VIEW   COMPARISON:  None Available.   FINDINGS: No fracture or static subluxation of the cervical spine. Normal cervical lordosis. Minimal disc space height loss and osteophytosis. Asymmetric right-sided cervical facet degenerative change with right-sided bony neural foraminal stenosis at C3 through C6. The skull base, cervical soft tissues, and upper chest are unremarkable.   IMPRESSION: 1.  No fracture or static subluxation of the cervical spine.   2.  Minimal disc space height loss and osteophytosis.   3. Asymmetric right-sided cervical facet degenerative change with right-sided bony neural foraminal stenosis at C3 through C6.   4. Cervical disc and neural foraminal pathology may be further evaluated by MRI if indicated by neurologically localizing signs and symptoms.     Electronically Signed   By: Delanna Ahmadi M.D.   On: 01/15/2022 14:32   PATIENT SURVEYS:  FOTO 35(55 predicted); 02/20/22 52   COGNITION: Overall cognitive status: Within functional limits for tasks assessed   SENSATION: Not tested   POSTURE: rounded shoulders, forward head, and elevated R shoulder   PALPATION: TTP B upper traps, suboccipitals and cervical paraspinals         CERVICAL ROM:    Active ROM A/PROM (deg) eval AROM 02/13/22 AROM 02/20/22  Flexion 75% 75% 50%  Extension 50% 50% 50%  Right lateral flexion 25% 50% 50%  Left lateral flexion 10% 25% 10%  Right rotation 25% 25% 25%  Left rotation 50% 75% 50%   (Blank rows = not tested)   UPPER EXTREMITY ROM: WFL throughout   Active ROM Right eval Left eval  Shoulder flexion      Shoulder extension  Shoulder abduction      Shoulder adduction      Shoulder extension      Shoulder internal rotation      Shoulder external rotation      Elbow flexion      Elbow extension      Wrist flexion      Wrist extension      Wrist ulnar deviation      Wrist radial deviation      Wrist pronation      Wrist supination       (Blank rows = not tested)   UPPER EXTREMITY MMT: [facility]    MMT Right eval Left eval  Shoulder flexion      Shoulder extension      Shoulder abduction      Shoulder adduction      Shoulder extension      Shoulder internal rotation      Shoulder external rotation      Middle trapezius      Lower trapezius      Elbow flexion      Elbow extension      Wrist flexion      Wrist extension      Wrist ulnar deviation      Wrist radial deviation      Wrist pronation      Wrist supination      Grip strength       (Blank rows = not tested)   CERVICAL SPECIAL TESTS:  Neck flexor muscle endurance test: Positive 6s duration   FUNCTIONAL TESTS:  Deferred    TODAY'S TREATMENT:    [facility] Adult PT Treatment:                                                DATE: 02/20/22 Therapeutic Exercise: Nustep L4 6 min Supine chest press/protraction 15x 5# Supine OH flexion with inspiration 15 5# Supine hor abd YTB 15x2 Chin tuck with rotation 10x2 each direction B UT stretch 30s x2 Manual Therapy: Manual scalene stretch on R 30s x3 Therapeutic Activity: FOTO and re-assessment of progress/goals/ROM  [facility] Adult PT Treatment:                                                DATE: 02/15/22 Therapeutic Exercise: Nustep L3 8 min Supine chest press/protraction 15x 4# Supine OH flexion with inspiration 15 4# Supine hor abd YTB 15x2 Rows YTB 15x2 Shoulder extension YTB 15x2 Chin tuck with rotation 10x2 each direction Manual Therapy: R scalene stretching 30s x3 R OA mobs 3x10 f/b gentle traction R first rib SNAG during 10 deep breaths  [facility] Adult PT Treatment:                                                 DATE: 02/13/22 Therapeutic Exercise: Nustep L2 6 min Supine chest press/protraction 15x 3# Supine OH flexion with inspiration 15 3# Supine hor abd YTB 15x Seated rows YTB 15x Seated shoulder extension YTB 15x Chin tucks over 1/2 roll 10x2 Chin tuck with rotation 10x2 each direction Manual Therapy: R  scalene stretching 30s x3 R OA mobs 3x10 f/b gentle traction       PATIENT EDUCATION:  Education details: Discussed eval findings, rehab rationale and POC and patient is in agreement  Person educated: Patient Education method: Explanation Education comprehension: verbalized understanding and needs further education   HOME EXERCISE PROGRAM: Access Code: [email or web address] URL: [email or web address] Date: 01/26/2022 Prepared by: Sharlynn Oliphant   Exercises - Seated Shoulder Shrug Circles AROM Forward  - 3 x daily - 5 x weekly - 1 sets - 10 reps - Seated Scapular Retraction  - 3 x daily - 5 x weekly - 1 sets - 10 reps - Seated Cervical Retraction  - 3 x daily - 5 x weekly - 1 sets - 10 reps   ASSESSMENT:   CLINICAL IMPRESSION: Slight loss of baseline motion noted at start of treatment but improved following interventions.  FOTO score improved.  Limited carryover noted b/t sessions as patient needs cuing to perform stretches properly to isolate tight muscles.  Added upper trap stretches to HEP.  Continued marked restrictions to cervical ROM with minimal accessory motion noted between cervical segments.  Discussed TPDN to assist patient in regaining mobility as she has difficulty performing HEP due to decreased kinesthetic awareness and need of more pro-active intervention.    OBJECTIVE IMPAIRMENTS: decreased knowledge of condition, decreased mobility, decreased ROM, increased muscle spasms, postural dysfunction, obesity, and pain.    ACTIVITY LIMITATIONS: carrying, lifting, and reach over head   PERSONAL FACTORS: Age, Fitness, and 1 comorbidity: COPD  are  also affecting patient's functional outcome.    REHAB POTENTIAL: Good   CLINICAL DECISION MAKING: Stable/uncomplicated   EVALUATION COMPLEXITY: Low     GOALS: Goals reviewed with patient? No   SHORT TERM GOALS: Target date: 02/09/2022     Patient to demonstrate independence in HEP  Baseline: [id number] Goal status: Met   2.  Decrease pain to 6/10 at worst Baseline: 8/10; 4/10 prior to flare-up Goal status: Met       LONG TERM GOALS: Target date: 02/23/2022     Decrease worst pain to 4/10 Baseline: 8/10 Goal status: INITIAL   2.  Patient to demo increased cervical ROM to 50% B side bending and R rotation Baseline:  Active ROM A/PROM (deg) eval AROM 02/13/22 AROM 02/20/22  Flexion 75% 75% 50%  Extension 50% 50% 50%  Right lateral flexion 25% 50% 50%  Left lateral flexion 10% 25% 10%  Right rotation 25% 25% 25%  Left rotation 50% 75% 50%    Goal status: INITIAL   3.  Increase FOTO score to 55 Baseline: 35; 02/20/22 52 Goal status: INITIAL   4.  Increase deep neck flexor endurance to 20s Baseline: 6s due to fatigue and pain Goal status: INITIAL         PLAN:   PT FREQUENCY: 2x/week   PT DURATION: 4 weeks   PLANNED INTERVENTIONS: Therapeutic exercises, Therapeutic activity, Neuromuscular re-education, Balance training, Gait training, Patient/Family education, Self Care, Joint mobilization, Dry Needling, Spinal mobilization, Manual therapy, and Re-evaluation   PLAN FOR NEXT SESSION: HEP review and update, posture training, ROM and stretching tasks, manual techniques as indicated, further assessment of upper cervical dysfunction   Lanice Shirts, PT 02/20/2022, 2:13 PM

## 2022-02-22 ENCOUNTER — Ambulatory Visit: Payer: PPO

## 2022-02-23 ENCOUNTER — Ambulatory Visit: Payer: PPO

## 2022-02-23 DIAGNOSIS — M542 Cervicalgia: Secondary | ICD-10-CM

## 2022-02-23 DIAGNOSIS — R293 Abnormal posture: Secondary | ICD-10-CM

## 2022-02-23 NOTE — Therapy (Signed)
OUTPATIENT PHYSICAL THERAPY TREATMENT NOTE   Patient Name: Amanda Pope MRN: [id number] DOB:[birth year], 77 y.o., female Today's Date: 02/23/2022  PCP: [name], MD   REFERRING PROVIDER: [name], MD    END OF SESSION:   PT End of Session - 02/23/22 1218     Visit Number 7    Number of Visits 8    Date for PT Re-Evaluation 03/23/22    Authorization Type humana    PT Start Time 1215    PT Stop Time 1300    PT Time Calculation (min) 45 min    Activity Tolerance Patient tolerated treatment well;Patient limited by pain    Behavior During Therapy [facility] for tasks assessed/performed             Past Medical History:  Diagnosis Date   Arthritis    hip & back   Asthma    COPD (chronic obstructive pulmonary disease) ([place])    Cystocele    Depression    Diabetes mellitus without complication ([place])    [employer] 05/27/2020   GERD (gastroesophageal reflux disease)    History of Helicobacter pylori infection    Hypertension    Shortness of breath dyspnea    Urticaria    Vitamin D deficiency    Past Surgical History:  Procedure Laterality Date   ABDOMINAL HYSTERECTOMY     And Removed 1 ovary   APPENDECTOMY     CHOLECYSTECTOMY  05/2007   TONSILLECTOMY     TOTAL HIP ARTHROPLASTY Right 06/09/2015   TOTAL HIP ARTHROPLASTY Right 06/09/2015   Procedure: RIGHT TOTAL HIP ARTHROPLASTY ANTERIOR APPROACH;  Surgeon: [name], MD;  Location: [place];  Service: Orthopedics;  Laterality: Right;   TUBAL LIGATION     Patient Active Problem List   Diagnosis Date Noted   Neck pain, acute 01/11/2022   Pain of both hip joints 08/19/2018   Chronic left-sided low back pain with left-sided sciatica 08/19/2018   Osteoarthritis of right hip 06/09/2015   Hip joint replacement status 06/09/2015   Chronic urticaria 11/23/2014   Other allergic rhinitis 11/23/2014   Asthma with COPD 11/23/2014   Hormone replacement therapy (postmenopausal) 08/27/2013   Subclinical hypothyroidism  08/27/2013   Hyperglycemia 08/27/2013   Cystocele    Hypertension    Depression    History of Helicobacter pylori infection    Vitamin D deficiency    COPD (chronic obstructive pulmonary disease) ([facility])     REFERRING DIAG: M54.2 (ICD-10-CM) - Neck pain    THERAPY DIAG: Neck pain    Rationale for Evaluation and Treatment Rehabilitation  PERTINENT HISTORY: Patient is a 77 year old Caucasian female who presents today for complete physical exam. She has a history of diabetes mellitus currently treated with metformin. She is not on a statin despite having an elevated LDL cholesterol. We had a long discussion today and I strongly recommended statin for cardiovascular prevention however she is hesitant to start a statin. She is also on estrogen for hot flashes. She states that she has been unable to stop taking estrogen due to the severity of the hot flashes. We discussed the risk of breast cancer and stroke and she is willing to continue the medication although she will try to start weaning herself down. She is also complaining of significant pain and stiffness in her neck. She has pain bilaterally in the trapezius muscles of the neck. There is no tenderness to palpation of the spinous processes of the transverse processes. She denies any numbness  or tingling or weakness in her arms. Flexeril has helped some. She is due shot, COVID booster, and an RSV vaccine. Her pneumonia vaccine is up-to-date. She is also due for shingles shot. She is overdue for mammogram. Her colonoscopy is up-to-date.   PRECAUTIONS: none  SUBJECTIVE:                                                                                                                                                                                      SUBJECTIVE STATEMENT:  Returns with increased symptoms in R side of neck.  Denies re-injury or exacerbating factors.  Awoke with increased symptoms.  PAIN:  Are you having pain? Yes: NPRS scale:  8/10 Pain location: R side neck Pain description: spasm Aggravating factors: ROM  Relieving factors: medication    OBJECTIVE: (objective measures completed at initial evaluation unless otherwise dated)   DIAGNOSTIC FINDINGS:  CLINICAL DATA:  Acute neck pain and stiffness, no known injury   EXAM: CERVICAL SPINE - COMPLETE 4+ VIEW   COMPARISON:  None Available.   FINDINGS: No fracture or static subluxation of the cervical spine. Normal cervical lordosis. Minimal disc space height loss and osteophytosis. Asymmetric right-sided cervical facet degenerative change with right-sided bony neural foraminal stenosis at C3 through C6. The skull base, cervical soft tissues, and upper chest are unremarkable.   IMPRESSION: 1.  No fracture or static subluxation of the cervical spine.   2.  Minimal disc space height loss and osteophytosis.   3. Asymmetric right-sided cervical facet degenerative change with right-sided bony neural foraminal stenosis at C3 through C6.   4. Cervical disc and neural foraminal pathology may be further evaluated by MRI if indicated by neurologically localizing signs and symptoms.     Electronically Signed   By: Delanna Ahmadi M.D.   On: 01/15/2022 14:32   PATIENT SURVEYS:  FOTO 35(55 predicted); 02/20/22 52   COGNITION: Overall cognitive status: Within functional limits for tasks assessed   SENSATION: Not tested   POSTURE: rounded shoulders, forward head, and elevated R shoulder   PALPATION: TTP B upper traps, suboccipitals and cervical paraspinals         CERVICAL ROM:    Active ROM A/PROM (deg) eval AROM 02/13/22 AROM 02/20/22  Flexion 75% 75% 50%  Extension 50% 50% 50%  Right lateral flexion 25% 50% 50%  Left lateral flexion 10% 25% 10%  Right rotation 25% 25% 25%  Left rotation 50% 75% 50%   (Blank rows = not tested)   UPPER EXTREMITY ROM: WFL throughout   Active ROM Right eval Left eval  Shoulder flexion      Shoulder extension  Shoulder abduction      Shoulder adduction      Shoulder extension      Shoulder internal rotation      Shoulder external rotation      Elbow flexion      Elbow extension      Wrist flexion      Wrist extension      Wrist ulnar deviation      Wrist radial deviation      Wrist pronation      Wrist supination       (Blank rows = not tested)   UPPER EXTREMITY MMT: [facility]    MMT Right eval Left eval  Shoulder flexion      Shoulder extension      Shoulder abduction      Shoulder adduction      Shoulder extension      Shoulder internal rotation      Shoulder external rotation      Middle trapezius      Lower trapezius      Elbow flexion      Elbow extension      Wrist flexion      Wrist extension      Wrist ulnar deviation      Wrist radial deviation      Wrist pronation      Wrist supination      Grip strength       (Blank rows = not tested)   CERVICAL SPECIAL TESTS:  Neck flexor muscle endurance test: Positive 6s duration   FUNCTIONAL TESTS:  Deferred    TODAY'S TREATMENT:    [facility] Adult PT Treatment:                                                DATE: 02/23/22 Therapeutic Exercise: Nustep L2 6 min Supine chest press/protraction 15x  Supine OH flexion with inspiration 15x Supine hor abd YTB 15x2 Chin tuck with rotation 10x2 each direction R UT stretch 30s x2 Manual Therapy: TPDN to R scalene group levator and splenius capitus.  Trigger Point Dry Needling Treatment: Pre-treatment instruction: Patient instructed on dry needling rationale, procedures, and possible side effects including pain during treatment (achy,cramping feeling), bruising, drop of blood, lightheadedness, nausea, sweating. Patient Consent Given: Yes Education handout provided: Yes Muscles treated: R scalene group levator and splenius capitus  Electrical stimulation performed: No Parameters: N/A Treatment response/outcome: Twitch response elicited and Palpable decrease in muscle  tension Post-treatment instructions: Patient instructed to expect possible mild to moderate muscle soreness later today and/or tomorrow. Patient instructed in methods to reduce muscle soreness and to continue prescribed HEP. If patient was dry needled over the lung field, patient was instructed on signs and symptoms of pneumothorax and, however unlikely, to see immediate medical attention should they occur. Patient was also educated on signs and symptoms of infection and to seek medical attention should they occur. Patient verbalized understanding of these instructions and education.   [facility] Adult PT Treatment:                                                DATE: 02/20/22 Therapeutic Exercise: Nustep L4 6 min Supine chest press/protraction 15x 5# Supine OH flexion with  inspiration 15 5# Supine hor abd YTB 15x2 Chin tuck with rotation 10x2 each direction B UT stretch 30s x2 Manual Therapy: Manual scalene stretch on R 30s x3 Therapeutic Activity: FOTO and re-assessment of progress/goals/ROM  [facility] Adult PT Treatment:                                                DATE: 02/15/22 Therapeutic Exercise: Nustep L3 8 min Supine chest press/protraction 15x 4# Supine OH flexion with inspiration 15 4# Supine hor abd YTB 15x2 Rows YTB 15x2 Shoulder extension YTB 15x2 Chin tuck with rotation 10x2 each direction Manual Therapy: R scalene stretching 30s x3 R OA mobs 3x10 f/b gentle traction R first rib SNAG during 10 deep breaths  [facility] Adult PT Treatment:                                                DATE: 02/13/22 Therapeutic Exercise: Nustep L2 6 min Supine chest press/protraction 15x 3# Supine OH flexion with inspiration 15 3# Supine hor abd YTB 15x Seated rows YTB 15x Seated shoulder extension YTB 15x Chin tucks over 1/2 roll 10x2 Chin tuck with rotation 10x2 each direction Manual Therapy: R scalene stretching 30s x3 R OA mobs 3x10 f/b gentle traction       PATIENT EDUCATION:   Education details: Discussed eval findings, rehab rationale and POC and patient is in agreement  Person educated: Patient Education method: Explanation Education comprehension: verbalized understanding and needs further education   HOME EXERCISE PROGRAM: Access Code: [email or web address] URL: [email or web address] Date: 02/23/2022 Prepared by: Sharlynn Oliphant  Exercises - Seated Cervical Retraction  - 3 x daily - 5 x weekly - 1 sets - 10 reps - Seated Upper Trapezius Stretch  - 2 x daily - 5 x weekly - 1 sets - 2 reps - 30s hold   ASSESSMENT:   CLINICAL IMPRESSION: Session began with TPDN to R scalene group, levator and splenius capitus, patient tolerated procedure well.  Advanced to stretching and postural retraining tasks but w/o weight or resistance so as not to aggravate targeted muscle groups.  ROM greatly improved following intervention and posture also more in line with normal.  Advanced stretching on HEP by adding OP to cervical side bending    OBJECTIVE IMPAIRMENTS: decreased knowledge of condition, decreased mobility, decreased ROM, increased muscle spasms, postural dysfunction, obesity, and pain.    ACTIVITY LIMITATIONS: carrying, lifting, and reach over head   PERSONAL FACTORS: Age, Fitness, and 1 comorbidity: COPD  are also affecting patient's functional outcome.    REHAB POTENTIAL: Good   CLINICAL DECISION MAKING: Stable/uncomplicated   EVALUATION COMPLEXITY: Low     GOALS: Goals reviewed with patient? No   SHORT TERM GOALS: Target date: 02/09/2022     Patient to demonstrate independence in HEP  Baseline: [id number] Goal status: Met   2.  Decrease pain to 6/10 at worst Baseline: 8/10; 4/10 prior to flare-up Goal status: Met       LONG TERM GOALS: Target date: 02/23/2022     Decrease worst pain to 4/10 Baseline: 8/10 Goal status: INITIAL   2.  Patient to demo increased cervical ROM to 50% B side bending and R rotation Baseline:  Active ROM A/PROM  (  deg) eval AROM 02/13/22 AROM 02/20/22  Flexion 75% 75% 50%  Extension 50% 50% 50%  Right lateral flexion 25% 50% 50%  Left lateral flexion 10% 25% 10%  Right rotation 25% 25% 25%  Left rotation 50% 75% 50%    Goal status: INITIAL   3.  Increase FOTO score to 55 Baseline: 35; 02/20/22 52 Goal status: INITIAL   4.  Increase deep neck flexor endurance to 20s Baseline: 6s due to fatigue and pain Goal status: INITIAL         PLAN:   PT FREQUENCY: 2x/week   PT DURATION: 4 weeks   PLANNED INTERVENTIONS: Therapeutic exercises, Therapeutic activity, Neuromuscular re-education, Balance training, Gait training, Patient/Family education, Self Care, Joint mobilization, Dry Needling, Spinal mobilization, Manual therapy, and Re-evaluation   PLAN FOR NEXT SESSION: HEP review and update, posture training, ROM and stretching tasks, manual techniques as indicated, further assessment of upper cervical dysfunction   Lanice Shirts, PT 02/23/2022, 1:03 PM

## 2022-02-25 LAB — HM DIABETES EYE EXAM

## 2022-03-04 ENCOUNTER — Other Ambulatory Visit: Payer: Self-pay | Admitting: Family Medicine

## 2022-03-04 DIAGNOSIS — K219 Gastro-esophageal reflux disease without esophagitis: Secondary | ICD-10-CM

## 2022-03-12 ENCOUNTER — Ambulatory Visit: Payer: PPO | Attending: Family Medicine

## 2022-03-12 DIAGNOSIS — R293 Abnormal posture: Secondary | ICD-10-CM | POA: Insufficient documentation

## 2022-03-12 DIAGNOSIS — M542 Cervicalgia: Secondary | ICD-10-CM | POA: Insufficient documentation

## 2022-03-12 NOTE — Therapy (Addendum)
OUTPATIENT PHYSICAL THERAPY TREATMENT NOTE/DC SUMMARY   Patient Name: Amanda Pope MRN: [id number] DOB:[birth year], 77 y.o., female Today's Date: 03/12/2022  PCP: Donita Brooks, MD   REFERRING PROVIDER: Donita Brooks, MD   PHYSICAL THERAPY DISCHARGE SUMMARY  Visits from Start of Care: 8  Current functional level related to goals / functional outcomes: UTA   Remaining deficits: Muscle spasms   Education / Equipment: HEP   Patient agrees to discharge. Patient goals were partially met. Patient is being discharged due to not returning since the last visit.  END OF SESSION:   PT End of Session - 03/12/22 1214     Visit Number 8    Number of Visits 8    Date for PT Re-Evaluation 03/23/22    Authorization Type humana    PT Start Time 1215    PT Stop Time 1300    PT Time Calculation (min) 45 min    Activity Tolerance Patient tolerated treatment well;Patient limited by pain    Behavior During Therapy WFL for tasks assessed/performed             Past Medical History:  Diagnosis Date   Arthritis    hip & back   Asthma    COPD (chronic obstructive pulmonary disease) (HCC)    Cystocele    Depression    Diabetes mellitus without complication (HCC)    Phreesia 05/27/2020   GERD (gastroesophageal reflux disease)    History of Helicobacter pylori infection    Hypertension    Shortness of breath dyspnea    Urticaria    Vitamin D deficiency    Past Surgical History:  Procedure Laterality Date   ABDOMINAL HYSTERECTOMY     And Removed 1 ovary   APPENDECTOMY     CHOLECYSTECTOMY  05/2007   TONSILLECTOMY     TOTAL HIP ARTHROPLASTY Right 06/09/2015   TOTAL HIP ARTHROPLASTY Right 06/09/2015   Procedure: RIGHT TOTAL HIP ARTHROPLASTY ANTERIOR APPROACH;  Surgeon: Tarry Kos, MD;  Location: [facility] OR;  Service: Orthopedics;  Laterality: Right;   TUBAL LIGATION     Patient Active Problem List   Diagnosis Date Noted   Neck pain, acute 01/11/2022   Pain of both hip  joints 08/19/2018   Chronic left-sided low back pain with left-sided sciatica 08/19/2018   Osteoarthritis of right hip 06/09/2015   Hip joint replacement status 06/09/2015   Chronic urticaria 11/23/2014   Other allergic rhinitis 11/23/2014   Asthma with COPD 11/23/2014   Hormone replacement therapy (postmenopausal) 08/27/2013   Subclinical hypothyroidism 08/27/2013   Hyperglycemia 08/27/2013   Cystocele    Hypertension    Depression    History of Helicobacter pylori infection    Vitamin D deficiency    COPD (chronic obstructive pulmonary disease) (HCC)     REFERRING DIAG: M54.2 (ICD-10-CM) - Neck pain    THERAPY DIAG: Neck pain    Rationale for Evaluation and Treatment Rehabilitation  PERTINENT HISTORY: Patient is a 77 year old Caucasian female who presents today for complete physical exam. She has a history of diabetes mellitus currently treated with metformin. She is not on a statin despite having an elevated LDL cholesterol. We had a long discussion today and I strongly recommended statin for cardiovascular prevention however she is hesitant to start a statin. She is also on estrogen for hot flashes. She states that she has been unable to stop taking estrogen due to the severity of the hot flashes. We discussed the risk of breast cancer and stroke  and she is willing to continue the medication although she will try to start weaning herself down. She is also complaining of significant pain and stiffness in her neck. She has pain bilaterally in the trapezius muscles of the neck. There is no tenderness to palpation of the spinous processes of the transverse processes. She denies any numbness or tingling or weakness in her arms. Flexeril has helped some. She is due shot, COVID booster, and an RSV vaccine. Her pneumonia vaccine is up-to-date. She is also due for shingles shot. She is overdue for mammogram. Her colonoscopy is up-to-date.   PRECAUTIONS: none  SUBJECTIVE:                                                                                                                                                                                       SUBJECTIVE STATEMENT:  Returns following a 2 week absence as she forgot to schedule additional visits.  Average pain levels are 2-3/10.  Feels she is 95% improved and agreeable to decrease PT frequency to 1w4  PAIN:  Are you having pain? Yes: NPRS scale: 3/10 Pain location: R side neck Pain description: spasm Aggravating factors: ROM  Relieving factors: medication    OBJECTIVE: (objective measures completed at initial evaluation unless otherwise dated)   DIAGNOSTIC FINDINGS:  CLINICAL DATA:  Acute neck pain and stiffness, no known injury   EXAM: CERVICAL SPINE - COMPLETE 4+ VIEW   COMPARISON:  None Available.   FINDINGS: No fracture or static subluxation of the cervical spine. Normal cervical lordosis. Minimal disc space height loss and osteophytosis. Asymmetric right-sided cervical facet degenerative change with right-sided bony neural foraminal stenosis at C3 through C6. The skull base, cervical soft tissues, and upper chest are unremarkable.   IMPRESSION: 1.  No fracture or static subluxation of the cervical spine.   2.  Minimal disc space height loss and osteophytosis.   3. Asymmetric right-sided cervical facet degenerative change with right-sided bony neural foraminal stenosis at C3 through C6.   4. Cervical disc and neural foraminal pathology may be further evaluated by MRI if indicated by neurologically localizing signs and symptoms.     Electronically Signed   By: Jearld Lesch M.D.   On: 01/15/2022 14:32   PATIENT SURVEYS:  FOTO 35(55 predicted); 02/20/22 52   COGNITION: Overall cognitive status: Within functional limits for tasks assessed   SENSATION: Not tested   POSTURE: rounded shoulders, forward head, and elevated R shoulder   PALPATION: TTP B upper traps, suboccipitals and cervical  paraspinals         CERVICAL ROM:    Active ROM A/PROM (deg) eval AROM 02/13/22 AROM 02/20/22  AROM  03/14/22  Flexion 75% 75% 50% 50%  Extension 50% 50% 50% 50%  Right lateral flexion 25% 50% 50% 50%  Left lateral flexion 10% 25% 10% 25%  Right rotation 25% 25% 25% 50%  Left rotation 50% 75% 50% 75%   (Blank rows = not tested)   UPPER EXTREMITY ROM: WFL throughout   Active ROM Right eval Left eval  Shoulder flexion      Shoulder extension      Shoulder abduction      Shoulder adduction      Shoulder extension      Shoulder internal rotation      Shoulder external rotation      Elbow flexion      Elbow extension      Wrist flexion      Wrist extension      Wrist ulnar deviation      Wrist radial deviation      Wrist pronation      Wrist supination       (Blank rows = not tested)   UPPER EXTREMITY MMT: [facility]    MMT Right eval Left eval  Shoulder flexion      Shoulder extension      Shoulder abduction      Shoulder adduction      Shoulder extension      Shoulder internal rotation      Shoulder external rotation      Middle trapezius      Lower trapezius      Elbow flexion      Elbow extension      Wrist flexion      Wrist extension      Wrist ulnar deviation      Wrist radial deviation      Wrist pronation      Wrist supination      Grip strength       (Blank rows = not tested)   CERVICAL SPECIAL TESTS:  Neck flexor muscle endurance test: Positive 6s duration 03/12/22 30s   FUNCTIONAL TESTS:  Deferred    TODAY'S TREATMENT:    [facility] Adult PT Treatment:                                                DATE: 03/12/22 Therapeutic Exercise: Nustep L4 6 min Supine chest press/protraction 15x 1# Supine OH flexion alternating 15/15 1# Supine hor abd YTB 15x2 Open book 10/10 Chin tuck with rotation 10x2 each direction B UT stretch 30s x2 Open book 10/10 Levator stretch 30s x2 Bil Doorway stretch 30s x2   [facility] Adult PT Treatment:                                                 DATE: 02/23/22 Therapeutic Exercise: Nustep L2 6 min Supine chest press/protraction 15x  Supine OH flexion with inspiration 15x Supine hor abd YTB 15x2 Chin tuck with rotation 10x2 each direction R UT stretch 30s x2 Manual Therapy: TPDN to R scalene group levator and splenius capitus.  Trigger Point Dry Needling Treatment: Pre-treatment instruction: Patient instructed on dry needling rationale, procedures, and possible side effects including pain during treatment (achy,cramping feeling), bruising, drop of blood, lightheadedness, nausea, sweating. Patient  Consent Given: Yes Education handout provided: Yes Muscles treated: R scalene group levator and splenius capitus  Electrical stimulation performed: No Parameters: N/A Treatment response/outcome: Twitch response elicited and Palpable decrease in muscle tension Post-treatment instructions: Patient instructed to expect possible mild to moderate muscle soreness later today and/or tomorrow. Patient instructed in methods to reduce muscle soreness and to continue prescribed HEP. If patient was dry needled over the lung field, patient was instructed on signs and symptoms of pneumothorax and, however unlikely, to see immediate medical attention should they occur. Patient was also educated on signs and symptoms of infection and to seek medical attention should they occur. Patient verbalized understanding of these instructions and education.   [facility] Adult PT Treatment:                                                DATE: 02/20/22 Therapeutic Exercise: Nustep L4 6 min Supine chest press/protraction 15x 5# Supine OH flexion with inspiration 15 5# Supine hor abd YTB 15x2 Chin tuck with rotation 10x2 each direction B UT stretch 30s x2 Manual Therapy: Manual scalene stretch on R 30s x3 Therapeutic Activity: FOTO and re-assessment of progress/goals/ROM  [facility] Adult PT Treatment:                                                DATE:  02/15/22 Therapeutic Exercise: Nustep L3 8 min Supine chest press/protraction 15x 4# Supine OH flexion with inspiration 15 4# Supine hor abd YTB 15x2 Rows YTB 15x2 Shoulder extension YTB 15x2 Chin tuck with rotation 10x2 each direction Manual Therapy: R scalene stretching 30s x3 R OA mobs 3x10 f/b gentle traction R first rib SNAG during 10 deep breaths  [facility] Adult PT Treatment:                                                DATE: 02/13/22 Therapeutic Exercise: Nustep L2 6 min Supine chest press/protraction 15x 3# Supine OH flexion with inspiration 15 3# Supine hor abd YTB 15x Seated rows YTB 15x Seated shoulder extension YTB 15x Chin tucks over 1/2 roll 10x2 Chin tuck with rotation 10x2 each direction Manual Therapy: R scalene stretching 30s x3 R OA mobs 3x10 f/b gentle traction       PATIENT EDUCATION:  Education details: Discussed eval findings, rehab rationale and POC and patient is in agreement  Person educated: Patient Education method: Explanation Education comprehension: verbalized understanding and needs further education   HOME EXERCISE PROGRAM: Access Code: [email or web address] URL: [email or web address] Date: 03/12/2022 Prepared by: Gustavus Bryant  Exercises - Seated Upper Trapezius Stretch  - 2 x daily - 5 x weekly - 1 sets - 2 reps - 30s hold - Doorway Pec Stretch at 90 Degrees Abduction  - 2 x daily - 5 x weekly - 1 sets - 2 reps - 30s hold   ASSESSMENT:   CLINICAL IMPRESSION: Goals and ROM re-assessed.  AROM increased, neck flexor endurance goal met and pain goal met.  Patient feels she is 90-95% improved and is agreeable to wean to 1w4    OBJECTIVE IMPAIRMENTS: decreased knowledge  of condition, decreased mobility, decreased ROM, increased muscle spasms, postural dysfunction, obesity, and pain.    ACTIVITY LIMITATIONS: carrying, lifting, and reach over head   PERSONAL FACTORS: Age, Fitness, and 1 comorbidity: COPD  are also affecting patient's  functional outcome.    REHAB POTENTIAL: Good   CLINICAL DECISION MAKING: Stable/uncomplicated   EVALUATION COMPLEXITY: Low     GOALS: Goals reviewed with patient? No   SHORT TERM GOALS: Target date: 02/09/2022     Patient to demonstrate independence in HEP  Baseline: [id number] Goal status: Met   2.  Decrease pain to 6/10 at worst Baseline: 8/10; 4/10 prior to flare-up Goal status: Met       LONG TERM GOALS: Target date: 02/23/2022     Decrease worst pain to 4/10 Baseline: 8/10; 03/14/22 2-3/10 pain on average Goal status: Met   2.  Patient to demo increased cervical ROM to 50% B side bending and R rotation Baseline:  Active ROM A/PROM (deg) eval AROM 02/13/22 AROM 02/20/22 AROM  03/14/22  Flexion 75% 75% 50% 50%  Extension 50% 50% 50% 50%  Right lateral flexion 25% 50% 50% 50%  Left lateral flexion 10% 25% 10% 25%  Right rotation 25% 25% 25% 50%  Left rotation 50% 75% 50% 75%    Goal status: Progressing   3.  Increase FOTO score to 55 Baseline: 35; 02/20/22 52 Goal status: Progressing   4.  Increase deep neck flexor endurance to 20s Baseline: 6s due to fatigue and pain; 03/12/22 30s Goal status: Met         PLAN:   PT FREQUENCY: 1x/week   PT DURATION: 4 weeks   PLANNED INTERVENTIONS: Therapeutic exercises, Therapeutic activity, Neuromuscular re-education, Balance training, Gait training, Patient/Family education, Self Care, Joint mobilization, Dry Needling, Spinal mobilization, Manual therapy, and Re-evaluation   PLAN FOR NEXT SESSION: HEP review and update, posture training, ROM and stretching tasks, manual techniques as indicated, further assessment of upper cervical dysfunction   Hildred Laser, PT 03/12/2022, 1:07 PM

## 2022-03-14 ENCOUNTER — Ambulatory Visit: Payer: PPO

## 2022-03-20 DIAGNOSIS — H40013 Open angle with borderline findings, low risk, bilateral: Secondary | ICD-10-CM | POA: Diagnosis not present

## 2022-03-20 DIAGNOSIS — H18413 Arcus senilis, bilateral: Secondary | ICD-10-CM | POA: Diagnosis not present

## 2022-03-20 DIAGNOSIS — H26493 Other secondary cataract, bilateral: Secondary | ICD-10-CM | POA: Diagnosis not present

## 2022-03-20 DIAGNOSIS — Z961 Presence of intraocular lens: Secondary | ICD-10-CM | POA: Diagnosis not present

## 2022-03-20 DIAGNOSIS — H26492 Other secondary cataract, left eye: Secondary | ICD-10-CM | POA: Diagnosis not present

## 2022-03-22 ENCOUNTER — Ambulatory Visit: Payer: PPO

## 2022-03-26 ENCOUNTER — Ambulatory Visit: Payer: Self-pay

## 2022-03-27 ENCOUNTER — Encounter: Payer: Self-pay | Admitting: Family Medicine

## 2022-03-27 DIAGNOSIS — H26492 Other secondary cataract, left eye: Secondary | ICD-10-CM | POA: Diagnosis not present

## 2022-03-28 ENCOUNTER — Ambulatory Visit: Payer: PPO

## 2022-03-30 ENCOUNTER — Other Ambulatory Visit: Payer: Self-pay | Admitting: Family Medicine

## 2022-04-03 ENCOUNTER — Other Ambulatory Visit: Payer: Self-pay | Admitting: Family Medicine

## 2022-04-03 ENCOUNTER — Encounter: Payer: Self-pay | Admitting: Family Medicine

## 2022-04-03 ENCOUNTER — Ambulatory Visit: Payer: PPO

## 2022-04-03 DIAGNOSIS — H26491 Other secondary cataract, right eye: Secondary | ICD-10-CM | POA: Diagnosis not present

## 2022-04-03 MED ORDER — ACCU-CHEK GUIDE VI STRP: ORAL_STRIP | 12 refills | Status: DC

## 2022-04-03 NOTE — Telephone Encounter (Signed)
Prescription Request  04/03/2022  LOV: 01/19/2022  What is the name of the medication or equipment?   ACCU-CHEK GUIDE test strip   Have you contacted your pharmacy to request a refill? Yes   Which pharmacy would you like this sent to?  [id number] [place]Phone: [phone]: [phone]   Patient notified that their request is being sent to the clinical staff for review and that they should receive a response within 2 business days.   Please advise pharmacist at [phone]

## 2022-04-03 NOTE — Telephone Encounter (Signed)
Requested Prescriptions  Pending Prescriptions Disp Refills   glucose blood (ACCU-CHEK GUIDE) test strip 100 strip 12    Sig: USE TO CHECK FBS DAILY DX: E11.9     Endocrinology: Diabetes - Testing Supplies Failed - 04/03/2022  1:35 PM      Failed - Valid encounter within last 12 months    Recent Outpatient Visits           1 year ago Diabetes mellitus type 2 with complications ([place])   [place] [name], MD   2 years ago Diabetes mellitus type 2 with complications ([place])   [place] Pickard, Cammie Mcgee, MD   2 years ago Elevated liver enzymes   [place] Dennard Schaumann, Cammie Mcgee, MD   3 years ago Subclinical hypothyroidism   [place], Warren T, MD   3 years ago Right-sided low back pain without sciatica, unspecified chronicity   [place] Pickard, Cammie Mcgee, MD

## 2022-04-10 ENCOUNTER — Telehealth: Payer: Self-pay

## 2022-04-10 ENCOUNTER — Ambulatory Visit: Payer: PPO | Attending: Family Medicine

## 2022-04-10 NOTE — Telephone Encounter (Signed)
TC due to missed visit.  Unable to leave VM.

## 2022-04-11 DIAGNOSIS — H26491 Other secondary cataract, right eye: Secondary | ICD-10-CM | POA: Diagnosis not present

## 2022-04-19 ENCOUNTER — Ambulatory Visit: Payer: PPO

## 2022-04-24 ENCOUNTER — Ambulatory Visit: Payer: PPO

## 2022-04-30 ENCOUNTER — Other Ambulatory Visit: Payer: Self-pay | Admitting: Family Medicine

## 2022-04-30 NOTE — Telephone Encounter (Signed)
Prescription Request  04/30/2022  LOV: 01/19/2022  What is the name of the medication or equipment? metFORMIN (GLUCOPHAGE) 1000 MG table   Have you contacted your pharmacy to request a refill? Yes   Which pharmacy would you like this sent to?  [id number] [address], [place] [address] [place] [address] Phone: [phone] Fax: [phone]    Patient notified that their request is being sent to the clinical staff for review and that they should receive a response within 2 business days.   Please advise at [place]

## 2022-04-30 NOTE — Telephone Encounter (Signed)
Prescription Request  04/30/2022  LOV: 01/19/2022  What is the name of the medication or equipment? Estradiol 0.5 mg tablet  90 day  Have you contacted your pharmacy to request a refill? Yes   Which pharmacy would you like this sent to?  [id number] [address], [place] [address] [place] [address] Phone: [phone] Fax: [phone]    Patient notified that their request is being sent to the clinical staff for review and that they should receive a response within 2 business days.   Please advise at [place]

## 2022-05-01 MED ORDER — ESTRADIOL 0.5 MG PO TABS: 0.5000 mg | ORAL_TABLET | Freq: Every day | ORAL | 0 refills | Status: DC

## 2022-05-01 NOTE — Telephone Encounter (Signed)
Call to patient- she has been scheduled for follow up- patient states she needs Estradiol- but not metformin. Requested Prescriptions  Pending Prescriptions Disp Refills   estradiol (ESTRACE) 0.5 MG tablet 90 tablet 1    Sig: Take 1 tablet (0.5 mg total) by mouth daily.     OB/GYN:  Estrogens Failed - 04/30/2022 12:59 PM      Failed - Mammogram is up-to-date per Health Maintenance      Failed - Valid encounter within last 12 months    Recent Outpatient Visits           1 year ago Diabetes mellitus type 2 with complications ([place])   [place] [name], MD   2 years ago Diabetes mellitus type 2 with complications ([place])   [place] Pickard, Cammie Mcgee, MD   2 years ago Elevated liver enzymes   [place] Dennard Schaumann, Cammie Mcgee, MD   3 years ago Subclinical hypothyroidism   [place] [name], MD   3 years ago Right-sided low back pain without sciatica, unspecified chronicity   [place] Pickard, Cammie Mcgee, MD       Future Appointments             In 6 days Dennard Schaumann, Cammie Mcgee, MD [facility]            Passed - Last BP in normal range    BP Readings from Last 1 Encounters:  01/19/22 132/78          metFORMIN (GLUCOPHAGE) 1000 MG tablet 180 tablet 2     Endocrinology:  Diabetes - Biguanides Failed - 04/30/2022 12:59 PM      Failed - B12 Level in normal range and within 720 days    No results found for: "VITAMINB12"       Failed - Valid encounter within last 6 months    Recent Outpatient Visits           1 year ago Diabetes mellitus type 2 with complications ([place])   [place] [name], MD   2 years ago Diabetes mellitus type 2 with complications ([place])   [place] Pickard, Cammie Mcgee, MD   2 years ago Elevated liver enzymes   [place] Dennard Schaumann, Cammie Mcgee, MD   3 years ago  Subclinical hypothyroidism   [place] [name], MD   3 years ago Right-sided low back pain without sciatica, unspecified chronicity   [place] Pickard, Cammie Mcgee, MD       Future Appointments             In 6 days Pickard, Cammie Mcgee, MD [facility]            Passed - Cr in normal range and within 360 days    Creat  Date Value Ref Range Status  01/19/2022 0.99 0.60 - 1.00 mg/dL Final   Creatinine, Urine  Date Value Ref Range Status  01/19/2022 259 20 - 275 mg/dL Final         Passed - HBA1C is between 0 and 7.9 and within 180 days    Hgb A1C (fingerstick)  Date Value Ref Range Status  12/03/2013 6.0 (H) <5.7 % Final    Comment:  According to the ADA Clinical Practice Recommendations for 2011, when HbA1c is used as a screening test:     >=6.5%   Diagnostic of Diabetes Mellitus            (if abnormal result is confirmed)   5.7-6.4%   Increased risk of developing Diabetes Mellitus   References:Diagnosis and Classification of Diabetes Mellitus,Diabetes [id number] 1):S62-S69 and Standards of Medical Care in         Diabetes - 2011,Diabetes [id number] (Suppl 1):S11-S61.      Hgb A1c MFr Bld  Date Value Ref Range Status  01/19/2022 7.1 (H) <5.7 % of total Hgb Final    Comment:    For someone without known diabetes, a hemoglobin A1c value of 6.5% or greater indicates that they may have  diabetes and this should be confirmed with a follow-up  test. . For someone with known diabetes, a value <7% indicates  that their diabetes is well controlled and a value  greater than or equal to 7% indicates suboptimal  control. A1c targets should be individualized based on  duration of diabetes, age, comorbid conditions, and  other considerations. . Currently, no consensus exists regarding use of hemoglobin A1c for  diagnosis of diabetes for children. .          Passed - eGFR in normal range and within 360 days    GFR, Est African American  Date Value Ref Range Status  12/04/2019 89 > OR = 60 mL/min/1.64m2 Final   GFR, Est Non African American  Date Value Ref Range Status  12/04/2019 77 > OR = 60 mL/min/1.23m2 Final   eGFR  Date Value Ref Range Status  01/19/2022 60 > OR = 60 mL/min/1.66m2 Final         Passed - CBC within normal limits and completed in the last 12 months    WBC  Date Value Ref Range Status  01/19/2022 10.9 (H) 3.8 - 10.8 Thousand/uL Final   RBC  Date Value Ref Range Status  01/19/2022 4.50 3.80 - 5.10 Million/uL Final   Hemoglobin  Date Value Ref Range Status  01/19/2022 12.8 11.7 - 15.5 g/dL Final   HCT  Date Value Ref Range Status  01/19/2022 38.3 35.0 - 45.0 % Final   MCHC  Date Value Ref Range Status  01/19/2022 33.4 32.0 - 36.0 g/dL Final   [facility]  Date Value Ref Range Status  01/19/2022 28.4 27.0 - 33.0 pg Final   MCV  Date Value Ref Range Status  01/19/2022 85.1 80.0 - 100.0 fL Final   No results found for: "PLTCOUNTKUC", "LABPLAT", "POCPLA" RDW  Date Value Ref Range Status  01/19/2022 12.6 11.0 - 15.0 % Final

## 2022-05-02 ENCOUNTER — Telehealth: Payer: Self-pay

## 2022-05-02 ENCOUNTER — Other Ambulatory Visit: Payer: Self-pay

## 2022-05-02 DIAGNOSIS — E118 Type 2 diabetes mellitus with unspecified complications: Secondary | ICD-10-CM

## 2022-05-02 MED ORDER — METFORMIN HCL 1000 MG PO TABS: ORAL_TABLET | ORAL | 1 refills | Status: DC

## 2022-05-02 NOTE — Telephone Encounter (Signed)
Prescription Request  05/02/2022  LOV: 01/19/22  What is the name of the medication or equipment? metFORMIN (GLUCOPHAGE) 1000 MG tablet [id number]   Have you contacted your pharmacy to request a refill? Yes   Which pharmacy would you like this sent to?  [id number] [address], [place] [address] [place] [address] Phone: [phone] Fax: [phone]    Patient notified that their request is being sent to the clinical staff for review and that they should receive a response within 2 business days.   Please advise at [place]

## 2022-05-07 ENCOUNTER — Ambulatory Visit: Payer: PPO | Admitting: Family Medicine

## 2022-05-07 ENCOUNTER — Other Ambulatory Visit: Payer: Self-pay | Admitting: Family Medicine

## 2022-05-08 NOTE — Telephone Encounter (Signed)
Requested medication (s) are due for refill today: yes  Requested medication (s) are on the active medication list: yes  Last refill:  03/05/22  Future visit scheduled: no  Notes to clinic:  Unable to refill per protocol, courtesy refill already given, routing for provider approval.      Requested Prescriptions  Pending Prescriptions Disp Refills   losartan (COZAAR) 100 MG tablet [Pharmacy Med Name: LOSARTAN POTASSIUM 100 MG TAB] 60 tablet 0    Sig: TAKE 1 TABLET BY MOUTH EVERY DAY     Cardiovascular:  Angiotensin Receptor Blockers Failed - 05/07/2022  2:47 AM      Failed - Valid encounter within last 6 months    Recent Outpatient Visits           1 year ago Diabetes mellitus type 2 with complications (H[facility])   [facility] Donita Brooks, MD   2 years ago Diabetes mellitus type 2 with complications (HCC)   [facility] Donita Brooks, MD   2 years ago Elevated liver enzymes   [facility] Tanya Nones, Priscille Heidelberg, MD   3 years ago Subclinical hypothyroidism   [facility] Tanya Nones, Priscille Heidelberg, MD   3 years ago Right-sided low back pain without sciatica, unspecified chronicity   [facility] Pickard, Priscille Heidelberg, MD              Passed - Cr in normal range and within 180 days    Creat  Date Value Ref Range Status  01/19/2022 0.99 0.60 - 1.00 mg/dL Final   Creatinine, Urine  Date Value Ref Range Status  01/19/2022 259 20 - 275 mg/dL Final         Passed - K in normal range and within 180 days    Potassium  Date Value Ref Range Status  01/19/2022 3.5 3.5 - 5.3 mmol/L Final         Passed - Patient is not pregnant      Passed - Last BP in normal range    BP Readings from Last 1 Encounters:  01/19/22 132/78

## 2022-05-09 ENCOUNTER — Other Ambulatory Visit: Payer: Self-pay | Admitting: Family Medicine

## 2022-05-09 DIAGNOSIS — K219 Gastro-esophageal reflux disease without esophagitis: Secondary | ICD-10-CM

## 2022-05-10 NOTE — Telephone Encounter (Signed)
Requested Prescriptions  Pending Prescriptions Disp Refills   omeprazole (PRILOSEC) 20 MG capsule [Pharmacy Med Name: OMEPRAZOLE DR 20 MG CAPSULE] 90 capsule 1    Sig: TAKE 1 CAPSULE BY MOUTH EVERY DAY     Gastroenterology: Proton Pump Inhibitors Failed - 05/09/2022 12:31 PM      Failed - Valid encounter within last 12 months    Recent Outpatient Visits           1 year ago Diabetes mellitus type 2 with complications (HCC)   [facility] Medicine Donita Brooks, MD   2 years ago Diabetes mellitus type 2 with complications (HCC)   [facility] Pickard, Priscille Heidelberg, MD   2 years ago Elevated liver enzymes   [facility] Tanya Nones, Priscille Heidelberg, MD   3 years ago Subclinical hypothyroidism   [facility] Medicine Donita Brooks, MD   3 years ago Right-sided low back pain without sciatica, unspecified chronicity   [facility] Pickard, Priscille Heidelberg, MD

## 2022-05-17 ENCOUNTER — Ambulatory Visit (INDEPENDENT_AMBULATORY_CARE_PROVIDER_SITE_OTHER): Payer: PPO | Admitting: Family Medicine

## 2022-05-17 ENCOUNTER — Encounter: Payer: Self-pay | Admitting: Family Medicine

## 2022-05-17 VITALS — BP 132/82 | HR 72 | Temp 97.5°F | Ht 65.0 in | Wt 201.0 lb

## 2022-05-17 DIAGNOSIS — E118 Type 2 diabetes mellitus with unspecified complications: Secondary | ICD-10-CM | POA: Diagnosis not present

## 2022-05-17 MED ORDER — TRIAMCINOLONE ACETONIDE 0.1 % EX CREA: 1.0000 | TOPICAL_CREAM | Freq: Two times a day (BID) | CUTANEOUS | 0 refills | Status: DC

## 2022-05-17 MED ORDER — BREZTRI AEROSPHERE 160-9-4.8 MCG/ACT IN AERO: 2.0000 | INHALATION_SPRAY | Freq: Two times a day (BID) | RESPIRATORY_TRACT | 11 refills | Status: DC

## 2022-05-17 NOTE — Progress Notes (Signed)
Subjective:    Patient ID: Amanda Pope, female    DOB: Jan 30, 1946, 77 y.o.   MRN: [id number]   Last hemoglobin A1c was 7.1.  Therefore recommended that she add Jardiance to the metformin.  She is here today to recheck her sugars.  She denies any chest pain shortness of breath or dyspnea on exertion.  She has had 1 yeast infection since switching to [place].  She denies any urinary tract infections.  We discussed GLP-1 agonist however she is afraid of needles.  We also discussed Rybelsus but I recommended she check on the price of that first.  Insurance will no longer cover Symbicort.  She would like to [facility] Past Medical History:  Diagnosis Date   Arthritis    hip & back   Asthma    COPD (chronic obstructive pulmonary disease)    Cystocele    Depression    Diabetes mellitus without complication    Phreesia 05/27/2020   GERD (gastroesophageal reflux disease)    History of Helicobacter pylori infection    Hypertension    Shortness of breath dyspnea    Urticaria    Vitamin D deficiency    Past Surgical History:  Procedure Laterality Date   ABDOMINAL HYSTERECTOMY     And Removed 1 ovary   APPENDECTOMY     CHOLECYSTECTOMY  05/2007   TONSILLECTOMY     TOTAL HIP ARTHROPLASTY Right 06/09/2015   TOTAL HIP ARTHROPLASTY Right 06/09/2015   Procedure: RIGHT TOTAL HIP ARTHROPLASTY ANTERIOR APPROACH;  Surgeon: Tarry Kos, MD;  Location: [facility] OR;  Service: Orthopedics;  Laterality: Right;   TUBAL LIGATION     Current Outpatient Medications on File Prior to Visit  Medication Sig Dispense Refill   Accu-Chek FastClix Lancets MISC Use to check FBS daily DX: E11.9 100 each 3   albuterol (VENTOLIN HFA) 108 (90 Base) MCG/ACT inhaler Inhale 2 puffs into the lungs every 6 (six) hours as needed for wheezing or shortness of breath. 2 each 3   aspirin EC 325 MG tablet Take 1 tablet (325 mg total) by mouth 2 (two) times daily. 84 tablet 0   blood glucose meter kit and supplies Dispense based on  patient and insurance preference. Use up to 1 time daily as directed. (FOR ICD-10 E10.9, E11.9). 1 each 0   Blood Glucose Monitoring Suppl (ACCU-CHEK AVIVA PLUS) w/Device KIT Use to check FBS daily DX: E11.9 1 kit 1   calcium gluconate 500 MG tablet Take 1 tablet (500 mg total) by mouth 3 (three) times daily. 30 tablet 11   [facility] D3 2000 units CAPS TAKE 2 CAPSULES BY MOUTH ONCE A DAY 60 capsule 9   empagliflozin (JARDIANCE) 25 MG TABS tablet Take 1 tablet (25 mg total) by mouth daily before breakfast. 30 tablet 3   estradiol (ESTRACE) 0.5 MG tablet Take 1 tablet (0.5 mg total) by mouth daily. 90 tablet 0   fluticasone (FLONASE) 50 MCG/ACT nasal spray SPRAY 2 SPRAYS INTO EACH NOSTRIL EVERY DAY 48 mL 2   gabapentin (NEURONTIN) 100 MG capsule TAKE 1 CAPSULE BY MOUTH 3 TIMES DAILY AS NEEDED FOR PAIN 90 capsule 3   glucose blood (ACCU-CHEK GUIDE) test strip USE TO CHECK FBS DAILY DX: E11.9 100 strip 12   hydrochlorothiazide (HYDRODIURIL) 25 MG tablet TAKE 1 TABLET BY MOUTH EVERY DAY 90 tablet 2   Lancets Misc. (ACCU-CHEK FASTCLIX LANCET) KIT Use to check FBS daily DX: E11.9 1 kit 1   levocetirizine (XYZAL) 5 MG tablet  TAKE 1 TABLET BY MOUTH EVERY DAY IN THE EVENING 90 tablet 1   losartan (COZAAR) 100 MG tablet TAKE 1 TABLET BY MOUTH EVERY DAY 90 tablet 0   metFORMIN (GLUCOPHAGE) 1000 MG tablet TAKE 1 TABLET (1,000 MG TOTAL) BY MOUTH TWICE A DAY WITH FOOD 180 tablet 1   omeprazole (PRILOSEC) 20 MG capsule TAKE 1 CAPSULE BY MOUTH EVERY DAY 90 capsule 1   No current facility-administered medications on file prior to visit.     Allergies  Allergen Reactions   Prevalite [Cholestyramine Light] Hives   Ace Inhibitors Rash   Social History   Socioeconomic History   Marital status: Married    Spouse name: Not on file   Number of children: Not on file   Years of education: Not on file   Highest education level: Not on file  Occupational History   Not on file  Tobacco Use   Smoking status: Former     Packs/day: 1    Types: Cigarettes    Start date: 01/29/1965    Quit date: 10/30/2010    Years since quitting: 11.5   Smokeless tobacco: Never  Substance and Sexual Activity   Alcohol use: No    Alcohol/week: 0.0 standard drinks of alcohol   Drug use: No   Sexual activity: Not on file  Other Topics Concern   Not on file  Social History Narrative   Entered 07/2013:   Is retired. Keeps [facility]. Loves it.   Married.   Social Determinants of Health   Financial Resource Strain: Low Risk  (05/27/2020)   Overall Financial Resource Strain (CARDIA)    Difficulty of Paying Living Expenses: Not hard at all  Food Insecurity: No Food Insecurity (05/27/2020)   Hunger Vital Sign    Worried About Running Out of Food in the Last Year: Never true    Ran Out of Food in the Last Year: Never true  Transportation Needs: No Transportation Needs (05/27/2020)   PRAPARE - Administrator, Civil Service (Medical): No    Lack of Transportation (Non-Medical): No  Physical Activity: Inactive (05/27/2020)   Exercise Vital Sign    Days of Exercise per Week: 0 days    Minutes of Exercise per Session: 0 min  Stress: No Stress Concern Present (05/27/2020)   [employer] of Occupational Health - Occupational Stress Questionnaire    Feeling of Stress : Not at all  Social Connections: Moderately Isolated (05/27/2020)   Social Connection and Isolation Panel [NHANES]    Frequency of Communication with Friends and Family: More than three times a week    Frequency of Social Gatherings with Friends and Family: More than three times a week    Attends Religious Services: Never    Database administrator or Organizations: No    Attends Banker Meetings: Never    Marital Status: Married  Catering manager Violence: Not At Risk (05/27/2020)   Humiliation, Afraid, Rape, and Kick questionnaire    Fear of Current or Ex-Partner: No    Emotionally Abused: No    Physically Abused: No     Sexually Abused: No     Review of Systems  All other systems reviewed and are negative.      Objective:   Physical Exam Constitutional:      General: She is not in acute distress.    Appearance: Normal appearance. She is not ill-appearing, toxic-appearing or diaphoretic.  HENT:     Nose: Congestion present.  Cardiovascular:     Rate and Rhythm: Normal rate and regular rhythm.     Heart sounds: Normal heart sounds. No murmur heard. Pulmonary:     Effort: Pulmonary effort is normal. No respiratory distress.     Breath sounds: Normal breath sounds. No stridor. No wheezing, rhonchi or rales.  Chest:     Chest wall: No tenderness.  Abdominal:     General: Abdomen is flat. Bowel sounds are normal. There is no distension.     Palpations: Abdomen is soft.     Tenderness: There is no abdominal tenderness. There is no right CVA tenderness or guarding.  Neurological:     Mental Status: She is alert.           Assessment & Plan:  Diabetes mellitus type 2 with complications - Plan: Hemoglobin A1c, COMPLETE METABOLIC PANEL WITH GFR, Lipid panel, Protein / Creatinine Ratio, Urine Check hemoglobin A1c today.  Blood pressure is excellent.  If A1c is less than 6.5 make no additional changes.  Patient will check on Rybelsus and decide if she wants to continue Jardiance or switch to Rybelsus.  I did switch the patient from Symbicort to [place] due to insurance

## 2022-05-18 LAB — PROTEIN / CREATININE RATIO, URINE
Creatinine, Urine: 121 mg/dL (ref 20–275)
Protein/Creat Ratio: 132 mg/g creat (ref 24–184)
Protein/Creatinine Ratio: 0.132 mg/mg creat (ref 0.024–0.184)
Total Protein, Urine: 16 mg/dL (ref 5–24)

## 2022-05-18 LAB — COMPLETE METABOLIC PANEL WITH GFR
AG Ratio: 1.5 (calc) (ref 1.0–2.5)
ALT: 17 U/L (ref 6–29)
AST: 19 U/L (ref 10–35)
Albumin: 4.4 g/dL (ref 3.6–5.1)
Alkaline phosphatase (APISO): 93 U/L (ref 37–153)
BUN: 19 mg/dL (ref 7–25)
CO2: 33 mmol/L — ABNORMAL HIGH (ref 20–32)
Calcium: 9.8 mg/dL (ref 8.6–10.4)
Chloride: 100 mmol/L (ref 98–110)
Creat: 0.98 mg/dL (ref 0.60–1.00)
Globulin: 3 g/dL (calc) (ref 1.9–3.7)
Glucose, Bld: 101 mg/dL — ABNORMAL HIGH (ref 65–99)
Potassium: 4.1 mmol/L (ref 3.5–5.3)
Sodium: 140 mmol/L (ref 135–146)
Total Bilirubin: 0.7 mg/dL (ref 0.2–1.2)
Total Protein: 7.4 g/dL (ref 6.1–8.1)
eGFR: 60 mL/min/{1.73_m2} (ref >=60)

## 2022-05-18 LAB — HEMOGLOBIN A1C
Hgb A1c MFr Bld: 7 % of total Hgb — ABNORMAL HIGH (ref <5.7)
Mean Plasma Glucose: 154 mg/dL
eAG (mmol/L): 8.5 mmol/L

## 2022-05-18 LAB — LIPID PANEL
Cholesterol: 189 mg/dL (ref <200)
HDL: 47 mg/dL — ABNORMAL LOW (ref >=50)
LDL Cholesterol (Calc): 104 mg/dL (calc) — ABNORMAL HIGH
Non-HDL Cholesterol (Calc): 142 mg/dL (calc) — ABNORMAL HIGH (ref <130)
Total CHOL/HDL Ratio: 4 (calc) (ref <5.0)
Triglycerides: 264 mg/dL — ABNORMAL HIGH (ref <150)

## 2022-05-25 ENCOUNTER — Other Ambulatory Visit: Payer: Self-pay | Admitting: Family Medicine

## 2022-05-25 DIAGNOSIS — E118 Type 2 diabetes mellitus with unspecified complications: Secondary | ICD-10-CM

## 2022-05-25 NOTE — Telephone Encounter (Signed)
OV 05/17/22 Requested Prescriptions  Pending Prescriptions Disp Refills   JARDIANCE 25 MG TABS tablet [Pharmacy Med Name: JARDIANCE 25 MG TABLET] 30 tablet 3    Sig: TAKE 1 TABLET BY MOUTH DAILY BEFORE BREAKFAST.     Endocrinology:  Diabetes - SGLT2 Inhibitors Failed - 05/25/2022  1:56 AM      Failed - Valid encounter within last 6 months    Recent Outpatient Visits           1 year ago Diabetes mellitus type 2 with complications (HCC)   [facility] Pickard, Priscille Heidelberg, MD   2 years ago Diabetes mellitus type 2 with complications (HCC)   [facility] Pickard, Priscille Heidelberg, MD   2 years ago Elevated liver enzymes   [facility] Tanya Nones, Priscille Heidelberg, MD   3 years ago Subclinical hypothyroidism   [facility] Donita Brooks, MD   3 years ago Right-sided low back pain without sciatica, unspecified chronicity   [facility] Pickard, Priscille Heidelberg, MD              Passed - Cr in normal range and within 360 days    Creat  Date Value Ref Range Status  05/17/2022 0.98 0.60 - 1.00 mg/dL Final   Creatinine, Urine  Date Value Ref Range Status  05/17/2022 121 20 - 275 mg/dL Final         Passed - HBA1C is between 0 and 7.9 and within 180 days    Hgb A1C (fingerstick)  Date Value Ref Range Status  12/03/2013 6.0 (H) <5.7 % Final    Comment:                                                                           According to the ADA Clinical Practice Recommendations for 2011, when HbA1c is used as a screening test:     >=6.5%   Diagnostic of Diabetes Mellitus            (if abnormal result is confirmed)   5.7-6.4%   Increased risk of developing Diabetes Mellitus   References:Diagnosis and Classification of Diabetes Mellitus,Diabetes Care,2011,34(Suppl 1):S62-S69 and Standards of Medical Care in         Diabetes - 2011,Diabetes Care,2011,34 (Suppl 1):S11-S61.      Hgb A1c MFr Bld  Date Value Ref Range  Status  05/17/2022 7.0 (H) <5.7 % of total Hgb Final    Comment:    For someone without known diabetes, a hemoglobin A1c value of 6.5% or greater indicates that they may have  diabetes and this should be confirmed with a follow-up  test. . For someone with known diabetes, a value <7% indicates  that their diabetes is well controlled and a value  greater than or equal to 7% indicates suboptimal  control. A1c targets should be individualized based on  duration of diabetes, age, comorbid conditions, and  other considerations. . Currently, no consensus exists regarding use of hemoglobin A1c for diagnosis of diabetes for children. .          Passed - eGFR in normal range and within 360 days    GFR, Est African American  Date Value Ref Range Status  12/04/2019 89 > OR = 60 mL/min/1.72m2 Final   GFR, Est Non African American  Date Value Ref Range Status  12/04/2019 77 > OR = 60 mL/min/1.47m2 Final   eGFR  Date Value Ref Range Status  05/17/2022 60 > OR = 60 mL/min/1.69m2 Final

## 2022-05-29 ENCOUNTER — Other Ambulatory Visit: Payer: Self-pay | Admitting: Family Medicine

## 2022-06-26 DIAGNOSIS — H02839 Dermatochalasis of unspecified eye, unspecified eyelid: Secondary | ICD-10-CM | POA: Diagnosis not present

## 2022-06-26 DIAGNOSIS — H02834 Dermatochalasis of left upper eyelid: Secondary | ICD-10-CM | POA: Diagnosis not present

## 2022-06-26 DIAGNOSIS — H40013 Open angle with borderline findings, low risk, bilateral: Secondary | ICD-10-CM | POA: Diagnosis not present

## 2022-09-20 ENCOUNTER — Other Ambulatory Visit: Payer: Self-pay | Admitting: Family Medicine

## 2022-09-20 DIAGNOSIS — K219 Gastro-esophageal reflux disease without esophagitis: Secondary | ICD-10-CM

## 2022-09-20 DIAGNOSIS — E118 Type 2 diabetes mellitus with unspecified complications: Secondary | ICD-10-CM

## 2022-09-20 NOTE — Telephone Encounter (Signed)
Prescription Request  09/20/2022  LOV: 05/17/2022  What is the name of the medication or equipment?   JARDIANCE 25 MG TABS tablet [[id number]] (EMPAGLIFLOZIN)  omeprazole (PRILOSEC) 20 MG capsule [[id number]]   estradiol (ESTRACE) 0.5 MG tablet   hydrochlorothiazide (HYDRODIURIL) 25 MG tablet  **90 day scripts requested**  Have you contacted your pharmacy to request a refill? Yes   Which pharmacy would you like this sent to?  [id number] [place], [place] - [address] [place] [address] Phone: [phone] Fax: [phone]    Patient notified that their request is being sent to the clinical staff for review and that they should receive a response within 2 business days.   Please advise pharmacist.

## 2022-09-21 NOTE — Telephone Encounter (Signed)
Requested Prescriptions  Pending Prescriptions Disp Refills   omeprazole (PRILOSEC) 20 MG capsule [Pharmacy Med Name: OMEPRAZOLE DR 20 MG CAPSULE] 90 capsule 0    Sig: TAKE 1 CAPSULE BY MOUTH EVERY DAY     Gastroenterology: Proton Pump Inhibitors Failed - 09/20/2022 11:31 AM      Failed - Valid encounter within last 12 months    Recent Outpatient Visits           1 year ago Diabetes mellitus type 2 with complications (HCC)   [facility] Pickard, Priscille Heidelberg, MD   2 years ago Diabetes mellitus type 2 with complications (HCC)   [facility] Pickard, Priscille Heidelberg, MD   3 years ago Elevated liver enzymes   [facility] Tanya Nones, Priscille Heidelberg, MD   3 years ago Subclinical hypothyroidism   [facility] Tanya Nones, Priscille Heidelberg, MD   3 years ago Right-sided low back pain without sciatica, unspecified chronicity   [facility] Pickard, Priscille Heidelberg, MD               empagliflozin (JARDIANCE) 25 MG TABS tablet [Pharmacy Med Name: JARDIANCE 25 MG TABLET] 90 tablet 0    Sig: TAKE 1 TABLET BY MOUTH EVERY DAY BEFORE BREAKFAST     Endocrinology:  Diabetes - SGLT2 Inhibitors Failed - 09/20/2022 11:31 AM      Failed - Valid encounter within last 6 months    Recent Outpatient Visits           1 year ago Diabetes mellitus type 2 with complications (HCC)   [facility] Pickard, Priscille Heidelberg, MD   2 years ago Diabetes mellitus type 2 with complications (HCC)   [facility] Pickard, Priscille Heidelberg, MD   3 years ago Elevated liver enzymes   [facility] Tanya Nones, Priscille Heidelberg, MD   3 years ago Subclinical hypothyroidism   [facility] Medicine Tanya Nones, Priscille Heidelberg, MD   3 years ago Right-sided low back pain without sciatica, unspecified chronicity   [facility] Medicine Pickard, Priscille Heidelberg, MD              Passed - Cr in normal range and within 360 days    Creat  Date Value Ref Range  Status  05/17/2022 0.98 0.60 - 1.00 mg/dL Final   Creatinine, Urine  Date Value Ref Range Status  05/17/2022 121 20 - 275 mg/dL Final         Passed - HBA1C is between 0 and 7.9 and within 180 days    Hgb A1C (fingerstick)  Date Value Ref Range Status  12/03/2013 6.0 (H) <5.7 % Final    Comment:                                                                           According to the ADA Clinical Practice Recommendations for 2011, when HbA1c is used as a screening test:     >=6.5%   Diagnostic of Diabetes Mellitus            (if abnormal result is confirmed)   5.7-6.4%   Increased risk of developing Diabetes Mellitus   References:Diagnosis  and Classification of Diabetes Mellitus,Diabetes Care,2011,34(Suppl 1):S62-S69 and Standards of Medical Care in         Diabetes - 2011,Diabetes Care,2011,34 (Suppl 1):S11-S61.      Hgb A1c MFr Bld  Date Value Ref Range Status  05/17/2022 7.0 (H) <5.7 % of total Hgb Final    Comment:    For someone without known diabetes, a hemoglobin A1c value of 6.5% or greater indicates that they may have  diabetes and this should be confirmed with a follow-up  test. . For someone with known diabetes, a value <7% indicates  that their diabetes is well controlled and a value  greater than or equal to 7% indicates suboptimal  control. A1c targets should be individualized based on  duration of diabetes, age, comorbid conditions, and  other considerations. . Currently, no consensus exists regarding use of hemoglobin A1c for diagnosis of diabetes for children. .          Passed - eGFR in normal range and within 360 days    GFR, Est African American  Date Value Ref Range Status  12/04/2019 89 > OR = 60 mL/min/1.46m2 Final   GFR, Est Non African American  Date Value Ref Range Status  12/04/2019 77 > OR = 60 mL/min/1.26m2 Final   eGFR  Date Value Ref Range Status  05/17/2022 60 > OR = 60 mL/min/1.61m2 Final

## 2022-09-21 NOTE — Telephone Encounter (Signed)
Requested medication (s) are due for refill today:   Yes  Requested medication (s) are on the active medication list:   Yes for all 4.   Only 2 are being requested The omeprazole and Jardiance.    Future visit scheduled:   No  There are notes from Elijio Miles, CMA where she has attempted to contact pt for an appt.     LOV 05/17/2022.   Last ordered: Omeprazole 05/10/2022 #90, 1 refill;   Jardiance 05/25/2022 #30, 3 refills;   Estradiol tab and hydrochlorothiazide on original request but not on this request. Returned because I wasn't sure if Dr. Tanya Nones if wanted to see her again or not since LOV 05/17/2022.     Requested Prescriptions  Pending Prescriptions Disp Refills   omeprazole (PRILOSEC) 20 MG capsule [Pharmacy Med Name: OMEPRAZOLE DR 20 MG CAPSULE] 90 capsule 1    Sig: TAKE 1 CAPSULE BY MOUTH EVERY DAY     Gastroenterology: Proton Pump Inhibitors Failed - 09/20/2022 11:31 AM      Failed - Valid encounter within last 12 months    Recent Outpatient Visits           1 year ago Diabetes mellitus type 2 with complications (HCC)   [facility] Donita Brooks, MD   2 years ago Diabetes mellitus type 2 with complications (HCC)   [facility] Pickard, Priscille Heidelberg, MD   3 years ago Elevated liver enzymes   [facility] Tanya Nones, Priscille Heidelberg, MD   3 years ago Subclinical hypothyroidism   [facility] Donita Brooks, MD   3 years ago Right-sided low back pain without sciatica, unspecified chronicity   [facility] Pickard, Priscille Heidelberg, MD               JARDIANCE 25 MG TABS tablet [Pharmacy Med Name: JARDIANCE 25 MG TABLET] 30 tablet 3    Sig: TAKE 1 TABLET BY MOUTH EVERY DAY BEFORE BREAKFAST     Endocrinology:  Diabetes - SGLT2 Inhibitors Failed - 09/20/2022 11:31 AM      Failed - Valid encounter within last 6 months    Recent Outpatient Visits           1 year ago Diabetes mellitus type 2 with  complications (HCC)   [facility] Donita Brooks, MD   2 years ago Diabetes mellitus type 2 with complications (HCC)   [facility] Pickard, Priscille Heidelberg, MD   3 years ago Elevated liver enzymes   [facility] Tanya Nones, Priscille Heidelberg, MD   3 years ago Subclinical hypothyroidism   [facility] Donita Brooks, MD   3 years ago Right-sided low back pain without sciatica, unspecified chronicity   [facility] Pickard, Priscille Heidelberg, MD              Passed - Cr in normal range and within 360 days    Creat  Date Value Ref Range Status  05/17/2022 0.98 0.60 - 1.00 mg/dL Final   Creatinine, Urine  Date Value Ref Range Status  05/17/2022 121 20 - 275 mg/dL Final         Passed - HBA1C is between 0 and 7.9 and within 180 days    Hgb A1C (fingerstick)  Date Value Ref Range Status  12/03/2013 6.0 (H) <5.7 % Final    Comment:  According to the ADA Clinical Practice Recommendations for 2011, when HbA1c is used as a screening test:     >=6.5%   Diagnostic of Diabetes Mellitus            (if abnormal result is confirmed)   5.7-6.4%   Increased risk of developing Diabetes Mellitus   References:Diagnosis and Classification of Diabetes Mellitus,Diabetes Care,2011,34(Suppl 1):S62-S69 and Standards of Medical Care in         Diabetes - 2011,Diabetes Care,2011,34 (Suppl 1):S11-S61.      Hgb A1c MFr Bld  Date Value Ref Range Status  05/17/2022 7.0 (H) <5.7 % of total Hgb Final    Comment:    For someone without known diabetes, a hemoglobin A1c value of 6.5% or greater indicates that they may have  diabetes and this should be confirmed with a follow-up  test. . For someone with known diabetes, a value <7% indicates  that their diabetes is well controlled and a value  greater than or equal to 7% indicates suboptimal  control. A1c targets  should be individualized based on  duration of diabetes, age, comorbid conditions, and  other considerations. . Currently, no consensus exists regarding use of hemoglobin A1c for diagnosis of diabetes for children. .          Passed - eGFR in normal range and within 360 days    GFR, Est African American  Date Value Ref Range Status  12/04/2019 89 > OR = 60 mL/min/1.8m2 Final   GFR, Est Non African American  Date Value Ref Range Status  12/04/2019 77 > OR = 60 mL/min/1.21m2 Final   eGFR  Date Value Ref Range Status  05/17/2022 60 > OR = 60 mL/min/1.62m2 Final

## 2022-09-26 NOTE — Telephone Encounter (Signed)
Pharmacy sent refill request for   hydrochlorothiazide (HYDRODIURIL) 25 MG tablet   omeprazole (PRILOSEC) 20 MG capsule [[id number]]   estradiol (ESTRACE) 0.5 MG tablet [[id number]]  **90 day scripts requested**   Pharmacy:  [id number] [place], [place] - [address] [place] [address] Phone: [phone]  Fax: [phone]   LOV 05/17/2022

## 2022-10-02 NOTE — Telephone Encounter (Signed)
Pharmacy also sent refill request for   triamcinolone cream (KENALOG) 0.1 % [[id number]]   Please see previous message in thread for other med refills requested.

## 2022-10-05 ENCOUNTER — Other Ambulatory Visit: Payer: Self-pay

## 2022-10-05 ENCOUNTER — Other Ambulatory Visit: Payer: Self-pay | Admitting: Family Medicine

## 2022-10-05 ENCOUNTER — Telehealth: Payer: Self-pay | Admitting: Family Medicine

## 2022-10-05 DIAGNOSIS — L501 Idiopathic urticaria: Secondary | ICD-10-CM

## 2022-10-05 MED ORDER — TRIAMCINOLONE ACETONIDE 0.1 % EX CREA
TOPICAL_CREAM | Freq: Two times a day (BID) | CUTANEOUS | 1 refills | Status: DC
Start: 2022-10-05 — End: 2022-10-05

## 2022-10-05 MED ORDER — TRIAMCINOLONE ACETONIDE 0.1 % EX CREA
TOPICAL_CREAM | Freq: Two times a day (BID) | CUTANEOUS | 1 refills | Status: AC
Start: 2022-10-05 — End: ?

## 2022-10-05 NOTE — Telephone Encounter (Signed)
Prescription Request  10/05/2022  LOV: 05/17/2022  What is the name of the medication or equipment? triamcinolone cream (KENALOG) 0.1   Have you contacted your pharmacy to request a refill? Yes   Which pharmacy would you like this sent to?  [id number] [place], [place] - [address] [place] [address] Phone: [phone] Fax: [phone]    Patient notified that their request is being sent to the clinical staff for review and that they should receive a response within 2 business days.   Please advise at [facility] [phone]

## 2022-10-16 ENCOUNTER — Ambulatory Visit: Payer: PPO | Admitting: Family Medicine

## 2022-10-16 ENCOUNTER — Encounter: Payer: Self-pay | Admitting: Family Medicine

## 2022-10-16 VITALS — BP 130/72 | HR 81 | Temp 98.3°F | Ht 65.0 in | Wt 201.0 lb

## 2022-10-16 DIAGNOSIS — J069 Acute upper respiratory infection, unspecified: Secondary | ICD-10-CM | POA: Diagnosis not present

## 2022-10-16 HISTORY — DX: Acute upper respiratory infection, unspecified: J06.9

## 2022-10-16 NOTE — Assessment & Plan Note (Addendum)
Symptoms overall improving. Lungs CTAB. Reassured patient that symptoms and exam findings are most consistent with a viral upper respiratory infection and explained lack of efficacy of antibiotics against viruses.  Discussed expected course and features suggestive of secondary bacterial infection.  Continue supportive care. Increase fluid intake with water or electrolyte solution like pedialyte. Encouraged acetaminophen as needed for fever/pain. Encouraged salt water gargling, chloraseptic spray and throat lozenges. Encouraged OTC guaifenesin. Encouraged saline sinus flushes and/or neti with humidified air. Return to office if symptoms persist or worsen.

## 2022-10-16 NOTE — Patient Instructions (Signed)

## 2022-10-16 NOTE — Progress Notes (Signed)
Subjective:  HPI: Amanda Pope is a 77 y.o. female presenting on 10/16/2022 for Follow-up, Cough, and Sore Throat (Fatigue per pt believes that she has pneumonia due to her family members all had pneumonia)   Cough  Sore Throat  Associated symptoms include coughing.   Patient is in today for 6 days of fatigue, dry cough, sore throat, headache, wheezing, clear rhinorrhea, chills. Symptoms are overall improving. Denies fever, shortness of breath, pleurisy, body aches, sinus pressure, N/V. Many of her family members have had pneumonia and an unknown virus. No home Covid test. Family members were negative for Covid, Flu, RSV and she declined testing today Has tried Albuterol with relief.  Review of Systems  Respiratory:  Positive for cough.   All other systems reviewed and are negative.   Relevant past medical history reviewed and updated as indicated.   Past Medical History:  Diagnosis Date   Arthritis    hip & back   Asthma    COPD (chronic obstructive pulmonary disease) (HCC)    Cystocele    Depression    Diabetes mellitus without complication (HCC)    Phreesia 05/27/2020   GERD (gastroesophageal reflux disease)    History of Helicobacter pylori infection    Hypertension    Shortness of breath dyspnea    Urticaria    Viral URI with cough 10/16/2022   Vitamin D deficiency      Past Surgical History:  Procedure Laterality Date   ABDOMINAL HYSTERECTOMY     And Removed 1 ovary   APPENDECTOMY     CHOLECYSTECTOMY  05/2007   TONSILLECTOMY     TOTAL HIP ARTHROPLASTY Right 06/09/2015   TOTAL HIP ARTHROPLASTY Right 06/09/2015   Procedure: RIGHT TOTAL HIP ARTHROPLASTY ANTERIOR APPROACH;  Surgeon: Tarry Kos, MD;  Location: [facility] OR;  Service: Orthopedics;  Laterality: Right;   TUBAL LIGATION      Allergies and medications reviewed and updated.   Current Outpatient Medications:    Accu-Chek FastClix Lancets MISC, Use to check FBS daily DX: E11.9, Disp: 100 each, Rfl: 3    albuterol (VENTOLIN HFA) 108 (90 Base) MCG/ACT inhaler, Inhale 2 puffs into the lungs every 6 (six) hours as needed for wheezing or shortness of breath., Disp: 2 each, Rfl: 3   aspirin EC 325 MG tablet, Take 1 tablet (325 mg total) by mouth 2 (two) times daily., Disp: 84 tablet, Rfl: 0   blood glucose meter kit and supplies, Dispense based on patient and insurance preference. Use up to 1 time daily as directed. (FOR ICD-10 E10.9, E11.9)., Disp: 1 each, Rfl: 0   Blood Glucose Monitoring Suppl (ACCU-CHEK AVIVA PLUS) w/Device KIT, Use to check FBS daily DX: E11.9, Disp: 1 kit, Rfl: 1   Budeson-Glycopyrrol-Formoterol (BREZTRI AEROSPHERE) 160-9-4.8 MCG/ACT AERO, Inhale 2 puffs into the lungs 2 (two) times daily., Disp: 10.7 g, Rfl: 11   calcium gluconate 500 MG tablet, Take 1 tablet (500 mg total) by mouth 3 (three) times daily., Disp: 30 tablet, Rfl: 11   [employer] D3 2000 units CAPS, TAKE 2 CAPSULES BY MOUTH ONCE A DAY, Disp: 60 capsule, Rfl: 9   empagliflozin (JARDIANCE) 25 MG TABS tablet, TAKE 1 TABLET BY MOUTH EVERY DAY BEFORE BREAKFAST, Disp: 90 tablet, Rfl: 0   estradiol (ESTRACE) 0.5 MG tablet, Take 1 tablet (0.5 mg total) by mouth daily., Disp: 90 tablet, Rfl: 0   fluticasone (FLONASE) 50 MCG/ACT nasal spray, SPRAY 2 SPRAYS INTO EACH NOSTRIL EVERY DAY, Disp: 48 mL, Rfl: 2  gabapentin (NEURONTIN) 100 MG capsule, TAKE 1 CAPSULE BY MOUTH 3 TIMES DAILY AS NEEDED FOR PAIN, Disp: 90 capsule, Rfl: 3   glucose blood (ACCU-CHEK GUIDE) test strip, USE TO CHECK FBS DAILY DX: E11.9, Disp: 100 strip, Rfl: 12   hydrochlorothiazide (HYDRODIURIL) 25 MG tablet, TAKE 1 TABLET BY MOUTH EVERY DAY, Disp: 90 tablet, Rfl: 2   Lancets Misc. (ACCU-CHEK FASTCLIX LANCET) KIT, Use to check FBS daily DX: E11.9, Disp: 1 kit, Rfl: 1   levocetirizine (XYZAL) 5 MG tablet, TAKE 1 TABLET BY MOUTH EVERY DAY IN THE EVENING, Disp: 90 tablet, Rfl: 1   losartan (COZAAR) 100 MG tablet, TAKE 1 TABLET BY MOUTH EVERY DAY, Disp: 90 tablet, Rfl:  1   metFORMIN (GLUCOPHAGE) 1000 MG tablet, TAKE 1 TABLET (1,000 MG TOTAL) BY MOUTH TWICE A DAY WITH FOOD, Disp: 180 tablet, Rfl: 1   omeprazole (PRILOSEC) 20 MG capsule, TAKE 1 CAPSULE BY MOUTH EVERY DAY, Disp: 90 capsule, Rfl: 0   triamcinolone cream (KENALOG) 0.1 %, Apply topically 2 (two) times daily., Disp: 30 g, Rfl: 1  Allergies  Allergen Reactions   Prevalite [Cholestyramine Light] Hives   Ace Inhibitors Rash    Objective:   BP 130/72   Pulse 81   Temp 98.3 F (36.8 C) (Oral)   Ht 5\' 5"  (1.651 m)   Wt 201 lb (91.2 kg)   SpO2 93%   BMI 33.45 kg/m      10/16/2022   12:07 PM 05/17/2022    2:22 PM 01/19/2022   10:20 AM  Vitals with BMI  Height 5\' 5"  5\' 5"  5\' 4"   Weight 201 lbs 201 lbs 205 lbs  BMI 33.45 33.45 35.17  Systolic 130 132 161  Diastolic 72 82 78  Pulse 81 72 86     Physical Exam Vitals and nursing note reviewed.  Constitutional:      Appearance: Normal appearance. She is normal weight.  HENT:     Head: Normocephalic and atraumatic.  Cardiovascular:     Rate and Rhythm: Normal rate and regular rhythm.     Pulses: Normal pulses.     Heart sounds: Normal heart sounds.  Pulmonary:     Effort: Pulmonary effort is normal.     Breath sounds: Normal breath sounds.  Skin:    General: Skin is warm and dry.  Neurological:     General: No focal deficit present.     Mental Status: She is alert and oriented to person, place, and time. Mental status is at baseline.  Psychiatric:        Mood and Affect: Mood normal.        Behavior: Behavior normal.        Thought Content: Thought content normal.        Judgment: Judgment normal.     Assessment & Plan:  Viral URI with cough Assessment & Plan: Symptoms overall improving. Lungs CTAB. Reassured patient that symptoms and exam findings are most consistent with a viral upper respiratory infection and explained lack of efficacy of antibiotics against viruses.  Discussed expected course and features suggestive of  secondary bacterial infection.  Continue supportive care. Increase fluid intake with water or electrolyte solution like pedialyte. Encouraged acetaminophen as needed for fever/pain. Encouraged salt water gargling, chloraseptic spray and throat lozenges. Encouraged OTC guaifenesin. Encouraged saline sinus flushes and/or neti with humidified air. Return to office if symptoms persist or worsen.       Follow up plan: Return if symptoms worsen or fail  to improve.  Park Meo, FNP

## 2022-11-06 ENCOUNTER — Other Ambulatory Visit: Payer: Self-pay

## 2022-11-06 ENCOUNTER — Telehealth: Payer: Self-pay | Admitting: Family Medicine

## 2022-11-06 ENCOUNTER — Other Ambulatory Visit: Payer: Self-pay | Admitting: Family Medicine

## 2022-11-06 DIAGNOSIS — I10 Essential (primary) hypertension: Secondary | ICD-10-CM

## 2022-11-06 MED ORDER — HYDROCHLOROTHIAZIDE 25 MG PO TABS: 25.0000 mg | ORAL_TABLET | Freq: Every day | ORAL | 0 refills | Status: DC

## 2022-11-06 NOTE — Telephone Encounter (Signed)
Requested medication (s) are due for refill today: Yes  Requested medication (s) are on the active medication list: Yes  Last refill:  05/01/22  Future visit scheduled: No  Notes to clinic:  Protocol indicates mammogram is out of date.    Requested Prescriptions  Pending Prescriptions Disp Refills   estradiol (ESTRACE) 0.5 MG tablet 90 tablet 0    Sig: Take 1 tablet (0.5 mg total) by mouth daily.     OB/GYN:  Estrogens Failed - 11/06/2022 10:18 AM      Failed - Mammogram is up-to-date per Health Maintenance      Failed - Valid encounter within last 12 months    Recent Outpatient Visits           1 year ago Diabetes mellitus type 2 with complications (HCC)   [facility] Donita Brooks, MD   2 years ago Diabetes mellitus type 2 with complications [facility])   [facility] Pickard, Priscille Heidelberg, MD   3 years ago Elevated liver enzymes   [facility] Tanya Nones, Priscille Heidelberg, MD   3 years ago Subclinical hypothyroidism   [facility] Donita Brooks, MD   3 years ago Right-sided low back pain without sciatica, unspecified chronicity   [facility] Pickard, Priscille Heidelberg, MD              Passed - Last BP in normal range    BP Readings from Last 1 Encounters:  10/16/22 130/72

## 2022-11-06 NOTE — Telephone Encounter (Signed)
Prescription Request  11/06/2022  LOV: 05/17/2022  What is the name of the medication or equipment? hydrochlorothiazide (HYDRODIURIL) 25 MG tablet   Have you contacted your pharmacy to request a refill? Yes   Which pharmacy would you like this sent to?  [id number] [place], [place] - [address] [place] [address] Phone: [phone] Fax: [phone]    Patient notified that their request is being sent to the clinical staff for review and that they should receive a response within 2 business days.   Please advise at [facility] [phone]

## 2022-11-06 NOTE — Telephone Encounter (Signed)
Prescription Request  11/06/2022  LOV: 05/17/2022  What is the name of the medication or equipment? estradiol (ESTRACE) 0.5 MG tablet   Have you contacted your pharmacy to request a refill? Yes   Which pharmacy would you like this sent to?  [id number] [place], [place] - [address] [place] [address] Phone: [phone] Fax: [phone]    Patient notified that their request is being sent to the clinical staff for review and that they should receive a response within 2 business days.   Please advise at [facility] [phone]

## 2022-11-07 ENCOUNTER — Telehealth: Payer: Self-pay | Admitting: Family Medicine

## 2022-11-07 ENCOUNTER — Other Ambulatory Visit: Payer: Self-pay

## 2022-11-07 DIAGNOSIS — L501 Idiopathic urticaria: Secondary | ICD-10-CM

## 2022-11-07 MED ORDER — LEVOCETIRIZINE DIHYDROCHLORIDE 5 MG PO TABS: ORAL_TABLET | ORAL | 1 refills | Status: DC

## 2022-11-07 NOTE — Telephone Encounter (Signed)
Prescription Request  11/07/2022  LOV: 05/17/2022  What is the name of the medication or equipment?   estradiol (ESTRACE) 0.5 MG tablet   levocetirizine (XYZAL) 5 MG tablet [[id number]]   **90 day scripts requested**  Have you contacted your pharmacy to request a refill? Yes   Which pharmacy would you like this sent to?  [id number] [place], [place] - [address] [place] [address] Phone: [phone] Fax: [phone]    Patient notified that their request is being sent to the clinical staff for review and that they should receive a response within 2 business days.   Please advise pharmacist.

## 2022-11-08 ENCOUNTER — Other Ambulatory Visit: Payer: Self-pay | Admitting: Family Medicine

## 2022-11-08 MED ORDER — ESTRADIOL 0.5 MG PO TABS: 0.5000 mg | ORAL_TABLET | Freq: Every day | ORAL | 1 refills | Status: DC

## 2022-11-22 ENCOUNTER — Other Ambulatory Visit: Payer: Self-pay | Admitting: Family Medicine

## 2022-11-23 NOTE — Telephone Encounter (Signed)
Requested medications are due for refill today.  yes  Requested medications are on the active medications list.  yes  Last refill. 03/30/2022 48mL 2 rf  Future visit scheduled.   no  Notes to clinic.  Pt is over due for CPE.    Requested Prescriptions  Pending Prescriptions Disp Refills   fluticasone (FLONASE) 50 MCG/ACT nasal spray [Pharmacy Med Name: FLUTICASONE PROP 50 MCG SPRAY] 48 mL 2    Sig: SPRAY 2 SPRAYS INTO EACH NOSTRIL EVERY DAY     Ear, Nose, and Throat: Nasal Preparations - Corticosteroids Failed - 11/22/2022  5:14 PM      Failed - Valid encounter within last 12 months    Recent Outpatient Visits           1 year ago Diabetes mellitus type 2 with complications (HCC)   [facility] Donita Brooks, MD   2 years ago Diabetes mellitus type 2 with complications (HCC)   [facility] Pickard, Priscille Heidelberg, MD   3 years ago Elevated liver enzymes   [facility] Tanya Nones, Priscille Heidelberg, MD   3 years ago Subclinical hypothyroidism   [facility] Donita Brooks, MD   3 years ago Right-sided low back pain without sciatica, unspecified chronicity   [facility] Pickard, Priscille Heidelberg, MD

## 2022-12-22 ENCOUNTER — Other Ambulatory Visit: Payer: Self-pay | Admitting: Family Medicine

## 2022-12-22 DIAGNOSIS — E118 Type 2 diabetes mellitus with unspecified complications: Secondary | ICD-10-CM

## 2022-12-24 NOTE — Telephone Encounter (Signed)
Requested Prescriptions  Pending Prescriptions Disp Refills   JARDIANCE 25 MG TABS tablet [Pharmacy Med Name: JARDIANCE 25 MG TABLET] 30 tablet 2    Sig: TAKE 1 TABLET BY MOUTH EVERY DAY BEFORE BREAKFAST     Endocrinology:  Diabetes - SGLT2 Inhibitors Failed - 12/22/2022  1:05 AM      Failed - HBA1C is between 0 and 7.9 and within 180 days    Hgb A1C (fingerstick)  Date Value Ref Range Status  12/03/2013 6.0 (H) <5.7 % Final    Comment:                                                                           According to the ADA Clinical Practice Recommendations for 2011, when HbA1c is used as a screening test:     >=6.5%   Diagnostic of Diabetes Mellitus            (if abnormal result is confirmed)   5.7-6.4%   Increased risk of developing Diabetes Mellitus   References:Diagnosis and Classification of Diabetes Mellitus,Diabetes Care,2011,34(Suppl 1):S62-S69 and Standards of Medical Care in         Diabetes - 2011,Diabetes Care,2011,34 (Suppl 1):S11-S61.      Hgb A1c MFr Bld  Date Value Ref Range Status  05/17/2022 7.0 (H) <5.7 % of total Hgb Final    Comment:    For someone without known diabetes, a hemoglobin A1c value of 6.5% or greater indicates that they may have  diabetes and this should be confirmed with a follow-up  test. . For someone with known diabetes, a value <7% indicates  that their diabetes is well controlled and a value  greater than or equal to 7% indicates suboptimal  control. A1c targets should be individualized based on  duration of diabetes, age, comorbid conditions, and  other considerations. . Currently, no consensus exists regarding use of hemoglobin A1c for diagnosis of diabetes for children. .          Failed - Valid encounter within last 6 months    Recent Outpatient Visits           2 years ago Diabetes mellitus type 2 with complications (HCC)   [facility] Donita Brooks, MD   3 years ago Diabetes mellitus type  2 with complications (HCC)   [facility] Pickard, Priscille Heidelberg, MD   3 years ago Elevated liver enzymes   [facility] Tanya Nones, Priscille Heidelberg, MD   3 years ago Subclinical hypothyroidism   [facility] Donita Brooks, MD   3 years ago Right-sided low back pain without sciatica, unspecified chronicity   [facility] Pickard, Priscille Heidelberg, MD       Future Appointments             In 1 month Pickard, Priscille Heidelberg, MD [facility]            Passed - Cr in normal range and within 360 days    Creat  Date Value Ref Range Status  05/17/2022 0.98 0.60 - 1.00 mg/dL Final   Creatinine, Urine  Date Value Ref Range Status  05/17/2022 121  20 - 275 mg/dL Final         Passed - eGFR in normal range and within 360 days    GFR, Est African American  Date Value Ref Range Status  12/04/2019 89 > OR = 60 mL/min/1.58m2 Final   GFR, Est Non African American  Date Value Ref Range Status  12/04/2019 77 > OR = 60 mL/min/1.37m2 Final   eGFR  Date Value Ref Range Status  05/17/2022 60 > OR = 60 mL/min/1.23m2 Final

## 2023-01-14 ENCOUNTER — Other Ambulatory Visit: Payer: PPO

## 2023-01-14 DIAGNOSIS — E118 Type 2 diabetes mellitus with unspecified complications: Secondary | ICD-10-CM | POA: Diagnosis not present

## 2023-01-14 DIAGNOSIS — I10 Essential (primary) hypertension: Secondary | ICD-10-CM

## 2023-01-14 DIAGNOSIS — Z0000 Encounter for general adult medical examination without abnormal findings: Secondary | ICD-10-CM

## 2023-01-14 DIAGNOSIS — R739 Hyperglycemia, unspecified: Secondary | ICD-10-CM | POA: Diagnosis not present

## 2023-01-18 LAB — COMPLETE METABOLIC PANEL WITH GFR
AG Ratio: 1.4 (calc) (ref 1.0–2.5)
ALT: 25 U/L (ref 6–29)
AST: 21 U/L (ref 10–35)
Albumin: 4.2 g/dL (ref 3.6–5.1)
Alkaline phosphatase (APISO): 104 U/L (ref 37–153)
BUN/Creatinine Ratio: 17 (calc) (ref 6–22)
BUN: 18 mg/dL (ref 7–25)
CO2: 32 mmol/L (ref 20–32)
Calcium: 9.7 mg/dL (ref 8.6–10.4)
Chloride: 97 mmol/L — ABNORMAL LOW (ref 98–110)
Creat: 1.03 mg/dL — ABNORMAL HIGH (ref 0.60–1.00)
Globulin: 2.9 g/dL (ref 1.9–3.7)
Glucose, Bld: 194 mg/dL — ABNORMAL HIGH (ref 65–99)
Potassium: 3.5 mmol/L (ref 3.5–5.3)
Sodium: 140 mmol/L (ref 135–146)
Total Bilirubin: 0.6 mg/dL (ref 0.2–1.2)
Total Protein: 7.1 g/dL (ref 6.1–8.1)
eGFR: 57 mL/min/{1.73_m2} — ABNORMAL LOW (ref >=60)

## 2023-01-18 LAB — LIPID PANEL
Cholesterol: 163 mg/dL (ref <200)
HDL: 42 mg/dL — ABNORMAL LOW (ref >=50)
LDL Cholesterol (Calc): 94 mg/dL
Non-HDL Cholesterol (Calc): 121 mg/dL (ref <130)
Total CHOL/HDL Ratio: 3.9 (calc) (ref <5.0)
Triglycerides: 170 mg/dL — ABNORMAL HIGH (ref <150)

## 2023-01-18 LAB — CBC WITH DIFFERENTIAL/PLATELET
Absolute Lymphocytes: 3532 {cells}/uL (ref 850–3900)
Absolute Monocytes: 785 {cells}/uL (ref 200–950)
Basophils Absolute: 65 {cells}/uL (ref 0–200)
Basophils Relative: 0.6 %
Eosinophils Absolute: 1188 {cells}/uL — ABNORMAL HIGH (ref 15–500)
Eosinophils Relative: 10.9 %
HCT: 40.6 % (ref 35.0–45.0)
Hemoglobin: 13 g/dL (ref 11.7–15.5)
MCH: 26.8 pg — ABNORMAL LOW (ref 27.0–33.0)
MCHC: 32 g/dL (ref 32.0–36.0)
MCV: 83.7 fL (ref 80.0–100.0)
MPV: 10.2 fL (ref 7.5–12.5)
Monocytes Relative: 7.2 %
Neutro Abs: 5330 {cells}/uL (ref 1500–7800)
Neutrophils Relative %: 48.9 %
Platelets: 334 10*3/uL (ref 140–400)
RBC: 4.85 10*6/uL (ref 3.80–5.10)
RDW: 13.5 % (ref 11.0–15.0)
Total Lymphocyte: 32.4 %
WBC: 10.9 10*3/uL — ABNORMAL HIGH (ref 3.8–10.8)

## 2023-01-18 LAB — HEMOGLOBIN A1C
Hgb A1c MFr Bld: 7.8 %{Hb} — ABNORMAL HIGH (ref <5.7)
Mean Plasma Glucose: 177 mg/dL
eAG (mmol/L): 9.8 mmol/L

## 2023-01-20 ENCOUNTER — Other Ambulatory Visit: Payer: Self-pay | Admitting: Family Medicine

## 2023-01-20 DIAGNOSIS — E118 Type 2 diabetes mellitus with unspecified complications: Secondary | ICD-10-CM

## 2023-01-25 ENCOUNTER — Encounter: Payer: PPO | Admitting: Family Medicine

## 2023-01-25 ENCOUNTER — Ambulatory Visit: Payer: Self-pay | Admitting: Family Medicine

## 2023-01-25 NOTE — Telephone Encounter (Signed)
Copied from CRM [id number]. Topic: Clinical - Pink Word Triage >> Jan 25, 2023  3:01 PM Amanda Pope wrote: Reason for Triage: Patient is sick. States sore throat, ear pain, head congestion, yellow mucus, headache, thought felt better but suddenly got worse. Since sudden onset of feeling worse, consistent diarrhea and is afraid to leave the house.   Chief Complaint: Diarrhea  Symptoms: Diarrhea, head congestion, left ear pain, sore throat, cough, headache Frequency: 5 episodes of diarrhea today  Pertinent Negatives: Patient denies fever, nausea, vomiting  Disposition: [x] ED /[] Urgent Care (no appt availability in office) / [] Appointment(In office/virtual)/ []  [facility]manda Pope Virtual [facility]/ [] Home Care/ [x] Refused Recommended Disposition /[] [place] [facility]/ []  Follow-up with PCP Additional Notes: Patient reports that she had an appointment today to be seen for her symptoms pertaining to head congestion, cough, ear pain, and sore throat. She states that she had to cancel the appointment because she developed diarrhea. She reports that she has had 5 episodes of diarrhea since the onset. She states that she is also experiencing a dull pain in her abdomen when she has to have a bowel movement. Patient states she has not been drinking much and that she "my mouth is so dry I can barely talk" and "I feel kind of weak and tired also." Patient advised to go to the ED for evaluation and treatment of her symptoms and for treatment for possible dehydration. Patient understood but states that she will wait to go to the ED if her symptoms do not improve.    Reason for Disposition  [1] Drinking very little AND [2] dehydration suspected (e.g., no urine > 12 hours, very dry mouth, very lightheaded)  Answer Assessment - Initial Assessment Questions 1. DIARRHEA SEVERITY: "How bad is the diarrhea?" "How many more stools have you had in the past 24 hours than normal?"    - NO DIARRHEA (SCALE 0)   - MILD (SCALE 1-3): Few  loose or mushy BMs; increase of 1-3 stools over normal daily number of stools; mild increase in ostomy output.   -  MODERATE (SCALE 4-7): Increase of 4-6 stools daily over normal; moderate increase in ostomy output.   -  SEVERE (SCALE 8-10; OR "WORST POSSIBLE"): Increase of 7 or more stools daily over normal; moderate increase in ostomy output; incontinence.     5 episodes of diarrhea today  2. ONSET: "When did the diarrhea begin?"      Today  3. BM CONSISTENCY: "How loose or watery is the diarrhea?"      Loose 4. VOMITING: "Are you also vomiting?" If Yes, ask: "How many times in the past 24 hours?"      No 5. ABDOMEN PAIN: "Are you having any abdomen pain?" If Yes, ask: "What does it feel like?" (e.g., crampy, dull, intermittent, constant)      Dull, intermittent  6. ABDOMEN PAIN SEVERITY: If present, ask: "How bad is the pain?"  (e.g., Scale 1-10; mild, moderate, or severe)   - MILD (1-3): doesn't interfere with normal activities, abdomen soft and not tender to touch    - MODERATE (4-7): interferes with normal activities or awakens from sleep, abdomen tender to touch    - SEVERE (8-10): excruciating pain, doubled over, unable to do any normal activities       Mild 7. ORAL INTAKE: If vomiting, "Have you been able to drink liquids?" "How much liquids have you had in the past 24 hours?"     Yes 8. HYDRATION: "Any signs of dehydration?" (  e.g., dry mouth [not just dry lips], too weak to stand, dizziness, new weight loss) "When did you last urinate?"     Dry mouth, "I feel weak and tired" 9. EXPOSURE: "Have you traveled to a foreign country recently?" "Have you been exposed to anyone with diarrhea?" "Could you have eaten any food that was spoiled?"     No 10. ANTIBIOTIC USE: "Are you taking antibiotics now or have you taken antibiotics in the past 2 months?"       No 11. OTHER SYMPTOMS: "Do you have any other symptoms?" (e.g., fever, blood in stool)       Cough, Left ear pain, head congestion,  yellow mucus, headache 12. PREGNANCY: "Is there any chance you are pregnant?" "When was your last menstrual period?"       No  Protocols used: [facility]

## 2023-01-31 ENCOUNTER — Telehealth: Payer: Self-pay | Admitting: Family Medicine

## 2023-01-31 ENCOUNTER — Other Ambulatory Visit: Payer: Self-pay

## 2023-01-31 ENCOUNTER — Other Ambulatory Visit: Payer: Self-pay | Admitting: Family Medicine

## 2023-01-31 DIAGNOSIS — K219 Gastro-esophageal reflux disease without esophagitis: Secondary | ICD-10-CM

## 2023-01-31 MED ORDER — OMEPRAZOLE 20 MG PO CPDR: 20.0000 mg | DELAYED_RELEASE_CAPSULE | Freq: Every day | ORAL | 1 refills | Status: DC

## 2023-01-31 NOTE — Telephone Encounter (Signed)
 Prescription Request  01/31/2023  LOV: 05/17/2022  What is the name of the medication or equipment? omeprazole  (PRILOSEC) 20 MG capsule   Have you contacted your pharmacy to request a refill? Yes   Which pharmacy would you like this sent to?  [id number] [place], [place] - [address] [place] [address] Phone: [phone] Fax: [phone]    Patient notified that their request is being sent to the clinical staff for review and that they should receive a response within 2 business days.   Please advise at [facility] [phone]

## 2023-02-04 ENCOUNTER — Other Ambulatory Visit: Payer: Self-pay

## 2023-02-04 NOTE — Telephone Encounter (Signed)
 Prescription Request  02/04/2023  LOV: 10/16/22  What is the name of the medication or equipment? losartan  (COZAAR ) 100 MG tablet [[id number]]   Have you contacted your pharmacy to request a refill? Yes   Which pharmacy would you like this sent to?  [id number] [place], [place] - [address] [place] [address] Phone: [phone] Fax: [phone]    Patient notified that their request is being sent to the clinical staff for review and that they should receive a response within 2 business days.   Please advise at [facility] [phone]

## 2023-02-06 ENCOUNTER — Other Ambulatory Visit: Payer: Self-pay | Admitting: Family Medicine

## 2023-02-06 DIAGNOSIS — I10 Essential (primary) hypertension: Secondary | ICD-10-CM

## 2023-02-07 MED ORDER — LOSARTAN POTASSIUM 100 MG PO TABS: 100.0000 mg | ORAL_TABLET | Freq: Every day | ORAL | 0 refills | Status: DC

## 2023-02-07 NOTE — Telephone Encounter (Signed)
 Requested Prescriptions  Pending Prescriptions Disp Refills   losartan  (COZAAR ) 100 MG tablet 90 tablet 0    Sig: Take 1 tablet (100 mg total) by mouth daily.     Cardiovascular:  Angiotensin Receptor Blockers Failed - 02/07/2023  9:05 AM      Failed - Cr in normal range and within 180 days    Creat  Date Value Ref Range Status  01/14/2023 1.03 (H) 0.60 - 1.00 mg/dL Final   Creatinine, Urine  Date Value Ref Range Status  05/17/2022 121 20 - 275 mg/dL Final         Failed - Valid encounter within last 6 months    Recent Outpatient Visits           2 years ago Diabetes mellitus type 2 with complications (HCC)   [facility] Duanne Butler DASEN, MD   3 years ago Diabetes mellitus type 2 with complications (HCC)   [facility] Pickard, Butler DASEN, MD   3 years ago Elevated liver enzymes   [facility] Duanne, Butler DASEN, MD   4 years ago Subclinical hypothyroidism   [facility] Duanne Butler DASEN, MD   4 years ago Right-sided low back pain without sciatica, unspecified chronicity   [facility] Pickard, Butler DASEN, MD       Future Appointments             In 5 days Duanne Butler DASEN, MD [facility]            Passed - K in normal range and within 180 days    Potassium  Date Value Ref Range Status  01/14/2023 3.5 3.5 - 5.3 mmol/L Final         Passed - Patient is not pregnant      Passed - Last BP in normal range    BP Readings from Last 1 Encounters:  10/16/22 130/72

## 2023-02-12 ENCOUNTER — Encounter: Payer: Self-pay | Admitting: Family Medicine

## 2023-02-12 ENCOUNTER — Ambulatory Visit: Payer: PPO | Admitting: Family Medicine

## 2023-02-12 ENCOUNTER — Telehealth: Payer: Self-pay | Admitting: Family Medicine

## 2023-02-12 VITALS — BP 132/76 | HR 75 | Temp 97.8°F | Ht 65.0 in | Wt 203.0 lb

## 2023-02-12 DIAGNOSIS — Z800 Family history of malignant neoplasm of digestive organs: Secondary | ICD-10-CM | POA: Insufficient documentation

## 2023-02-12 DIAGNOSIS — Z1231 Encounter for screening mammogram for malignant neoplasm of breast: Secondary | ICD-10-CM

## 2023-02-12 DIAGNOSIS — E118 Type 2 diabetes mellitus with unspecified complications: Secondary | ICD-10-CM | POA: Diagnosis not present

## 2023-02-12 DIAGNOSIS — Z0001 Encounter for general adult medical examination with abnormal findings: Secondary | ICD-10-CM

## 2023-02-12 DIAGNOSIS — I10 Essential (primary) hypertension: Secondary | ICD-10-CM | POA: Diagnosis not present

## 2023-02-12 DIAGNOSIS — K219 Gastro-esophageal reflux disease without esophagitis: Secondary | ICD-10-CM | POA: Insufficient documentation

## 2023-02-12 DIAGNOSIS — Z23 Encounter for immunization: Secondary | ICD-10-CM | POA: Diagnosis not present

## 2023-02-12 DIAGNOSIS — Z0000 Encounter for general adult medical examination without abnormal findings: Secondary | ICD-10-CM

## 2023-02-12 DIAGNOSIS — R635 Abnormal weight gain: Secondary | ICD-10-CM | POA: Insufficient documentation

## 2023-02-12 DIAGNOSIS — R1310 Dysphagia, unspecified: Secondary | ICD-10-CM | POA: Insufficient documentation

## 2023-02-12 DIAGNOSIS — K5730 Diverticulosis of large intestine without perforation or abscess without bleeding: Secondary | ICD-10-CM | POA: Insufficient documentation

## 2023-02-12 DIAGNOSIS — K909 Intestinal malabsorption, unspecified: Secondary | ICD-10-CM | POA: Insufficient documentation

## 2023-02-12 MED ORDER — ONDANSETRON HCL 4 MG PO TABS: 4.0000 mg | ORAL_TABLET | Freq: Three times a day (TID) | ORAL | 0 refills | Status: DC | PRN

## 2023-02-12 MED ORDER — TIRZEPATIDE 2.5 MG/0.5ML ~~LOC~~ SOAJ: 2.5000 mg | SUBCUTANEOUS | 1 refills | Status: DC

## 2023-02-12 MED ORDER — GABAPENTIN 300 MG PO CAPS: 300.0000 mg | ORAL_CAPSULE | Freq: Three times a day (TID) | ORAL | 3 refills | Status: DC

## 2023-02-12 NOTE — Addendum Note (Signed)
 Addended by: Venia Carbon K on: 02/12/2023 12:37 PM   Modules accepted: Orders

## 2023-02-12 NOTE — Telephone Encounter (Addendum)
 Marland Kitchen

## 2023-02-12 NOTE — Progress Notes (Signed)
 Subjective:    Patient ID: Amanda Pope, female    DOB: January 25, 1946, 78 y.o.   MRN: [id number]  Patient is a 78 year old Caucasian female here today for complete physical exam.  She is due for her mammogram.  Colonoscopy is up-to-date.  She is due for a flu shot.  She would like to get that today.  She declines the shingles vaccine.  Diabetic foot exam was performed today and is normal.  She does have worsening neuropathic pain however she is currently taking gabapentin  100 mg twice a day.  This helps some but the pain is becoming debilitating.  Blood pressure is excellent 132/76.  Most recent lab work is listed below Lab on 01/14/2023  Component Date Value Ref Range Status   WBC 01/14/2023 10.9 (H)  3.8 - 10.8 Thousand/uL Final   RBC 01/14/2023 4.85  3.80 - 5.10 Million/uL Final   Hemoglobin 01/14/2023 13.0  11.7 - 15.5 g/dL Final   HCT 87/83/7975 40.6  35.0 - 45.0 % Final   MCV 01/14/2023 83.7  80.0 - 100.0 fL Final   MCH 01/14/2023 26.8 (L)  27.0 - 33.0 pg Final   MCHC 01/14/2023 32.0  32.0 - 36.0 g/dL Final   Comment: For adults, a slight decrease in the calculated MCHC value (in the range of 30 to 32 g/dL) is most likely not clinically significant; however, it should be interpreted with caution in correlation with other red cell parameters and the patient's clinical condition.    RDW 01/14/2023 13.5  11.0 - 15.0 % Final   Platelets 01/14/2023 334  140 - 400 Thousand/uL Final   MPV 01/14/2023 10.2  7.5 - 12.5 fL Final   Neutro Abs 01/14/2023 5,330  1,500 - 7,800 cells/uL Final   Absolute Lymphocytes 01/14/2023 3,532  850 - 3,900 cells/uL Final   Absolute Monocytes 01/14/2023 785  200 - 950 cells/uL Final   Eosinophils Absolute 01/14/2023 1,188 (H)  15 - 500 cells/uL Final   Basophils Absolute 01/14/2023 65  0 - 200 cells/uL Final   Neutrophils Relative % 01/14/2023 48.9  % Final   Total Lymphocyte 01/14/2023 32.4  % Final   Monocytes Relative 01/14/2023 7.2  % Final    Eosinophils Relative 01/14/2023 10.9  % Final   Basophils Relative 01/14/2023 0.6  % Final   Glucose, Bld 01/14/2023 194 (H)  65 - 99 mg/dL Final   Comment: .            Fasting reference interval . For someone without known diabetes, a glucose value >125 mg/dL indicates that they may have diabetes and this should be confirmed with a follow-up test. .    BUN 01/14/2023 18  7 - 25 mg/dL Final   Creat 87/83/7975 1.03 (H)  0.60 - 1.00 mg/dL Final   eGFR 87/83/7975 57 (L)  > OR = 60 mL/min/1.27m2 Final   BUN/Creatinine Ratio 01/14/2023 17  6 - 22 (calc) Final   Sodium 01/14/2023 140  135 - 146 mmol/L Final   Potassium 01/14/2023 3.5  3.5 - 5.3 mmol/L Final   Chloride 01/14/2023 97 (L)  98 - 110 mmol/L Final   CO2 01/14/2023 32  20 - 32 mmol/L Final   Calcium  01/14/2023 9.7  8.6 - 10.4 mg/dL Final   Total Protein 87/83/7975 7.1  6.1 - 8.1 g/dL Final   Albumin 87/83/7975 4.2  3.6 - 5.1 g/dL Final   Globulin 87/83/7975 2.9  1.9 - 3.7 g/dL (calc) Final   AG Ratio 01/14/2023  1.4  1.0 - 2.5 (calc) Final   Total Bilirubin 01/14/2023 0.6  0.2 - 1.2 mg/dL Final   Alkaline phosphatase (APISO) 01/14/2023 104  37 - 153 U/L Final   AST 01/14/2023 21  10 - 35 U/L Final   ALT 01/14/2023 25  6 - 29 U/L Final   Cholesterol 01/14/2023 163  <200 mg/dL Final   HDL 87/83/7975 42 (L)  > OR = 50 mg/dL Final   Triglycerides 87/83/7975 170 (H)  <150 mg/dL Final   LDL Cholesterol (Calc) 01/14/2023 94  mg/dL (calc) Final   Comment: Reference range: <100 . Desirable range <100 mg/dL for primary prevention;   <70 mg/dL for patients with CHD or diabetic patients  with > or = 2 CHD risk factors. SABRA LDL-C is now calculated using the Martin-Hopkins  calculation, which is a validated novel method providing  better accuracy than the Friedewald equation in the  estimation of LDL-C.  Gladis APPLETHWAITE et al. SANDREA. 7986;689(80): 2061-2068  (http://education.[facility].com/faq/FAQ164)    Total CHOL/HDL Ratio  01/14/2023 3.9  <5.0 (calc) Final   Non-HDL Cholesterol (Calc) 01/14/2023 121  <130 mg/dL (calc) Final   Comment: For patients with diabetes plus 1 major ASCVD risk  factor, treating to a non-HDL-C goal of <100 mg/dL  (LDL-C of <29 mg/dL) is considered a therapeutic  option.    Hgb A1c MFr Bld 01/14/2023 7.8 (H)  <5.7 % of total Hgb Final   Comment: For someone without known diabetes, a hemoglobin A1c value of 6.5% or greater indicates that they may have  diabetes and this should be confirmed with a follow-up  test. . For someone with known diabetes, a value <7% indicates  that their diabetes is well controlled and a value  greater than or equal to 7% indicates suboptimal  control. A1c targets should be individualized based on  duration of diabetes, age, comorbid conditions, and  other considerations. . Currently, no consensus exists regarding use of hemoglobin A1c for diagnosis of diabetes for children. .    Mean Plasma Glucose 01/14/2023 177  mg/dL Final   eAG (mmol/L) 87/83/7975 9.8  mmol/L Final    Past Medical History:  Diagnosis Date   Arthritis    hip & back   Asthma    COPD (chronic obstructive pulmonary disease) (HCC)    Cystocele    Depression    Diabetes mellitus without complication (HCC)    Phreesia 05/27/2020   GERD (gastroesophageal reflux disease)    History of Helicobacter pylori infection    Hypertension    Shortness of breath dyspnea    Urticaria    Viral URI with cough 10/16/2022   Vitamin D  deficiency    Past Surgical History:  Procedure Laterality Date   ABDOMINAL HYSTERECTOMY     And Removed 1 ovary   APPENDECTOMY     CHOLECYSTECTOMY  05/2007   TONSILLECTOMY     TOTAL HIP ARTHROPLASTY Right 06/09/2015   TOTAL HIP ARTHROPLASTY Right 06/09/2015   Procedure: RIGHT TOTAL HIP ARTHROPLASTY ANTERIOR APPROACH;  Surgeon: Kay CHRISTELLA Cummins, MD;  Location: [facility] OR;  Service: Orthopedics;  Laterality: Right;   TUBAL LIGATION     Current Outpatient  Medications on File Prior to Visit  Medication Sig Dispense Refill   Accu-Chek FastClix Lancets MISC Use to check FBS daily DX: E11.9 100 each 3   albuterol  (VENTOLIN  HFA) 108 (90 Base) MCG/ACT inhaler Inhale 2 puffs into the lungs every 6 (six) hours as needed for wheezing or shortness of breath.  2 each 3   aspirin  EC 325 MG tablet Take 1 tablet (325 mg total) by mouth 2 (two) times daily. 84 tablet 0   blood glucose meter kit and supplies Dispense based on patient and insurance preference. Use up to 1 time daily as directed. (FOR ICD-10 E10.9, E11.9). 1 each 0   Blood Glucose Monitoring Suppl (ACCU-CHEK AVIVA PLUS) w/Device KIT Use to check FBS daily DX: E11.9 1 kit 1   Budeson-Glycopyrrol-Formoterol  (BREZTRI  AEROSPHERE) 160-9-4.8 MCG/ACT AERO Inhale 2 puffs into the lungs 2 (two) times daily. 10.7 g 11   calcium  gluconate 500 MG tablet Take 1 tablet (500 mg total) by mouth 3 (three) times daily. 30 tablet 11   [employer]S D3 2000 units CAPS TAKE 2 CAPSULES BY MOUTH ONCE A DAY 60 capsule 9   estradiol  (ESTRACE ) 0.5 MG tablet Take 1 tablet (0.5 mg total) by mouth daily. 90 tablet 1   fluticasone  (FLONASE ) 50 MCG/ACT nasal spray SPRAY 2 SPRAYS INTO EACH NOSTRIL EVERY DAY 48 mL 2   glucose blood (ACCU-CHEK GUIDE) test strip USE TO CHECK FBS DAILY DX: E11.9 100 strip 12   hydrochlorothiazide  (HYDRODIURIL ) 25 MG tablet TAKE 1 TABLET (25 MG TOTAL) BY MOUTH DAILY. 90 tablet 0   JARDIANCE  25 MG TABS tablet TAKE 1 TABLET BY MOUTH EVERY DAY BEFORE BREAKFAST 30 tablet 0   Lancets Misc. (ACCU-CHEK FASTCLIX LANCET) KIT Use to check FBS daily DX: E11.9 1 kit 1   levocetirizine (XYZAL ) 5 MG tablet TAKE 1 TABLET BY MOUTH EVERY DAY IN THE EVENING 90 tablet 1   losartan  (COZAAR ) 100 MG tablet Take 1 tablet (100 mg total) by mouth daily. 90 tablet 0   metFORMIN  (GLUCOPHAGE ) 1000 MG tablet TAKE 1 TABLET (1,000 MG TOTAL) BY MOUTH TWICE A DAY WITH FOOD 180 tablet 1   omeprazole  (PRILOSEC) 20 MG capsule Take 1 capsule (20 mg  total) by mouth daily. 30 capsule 1   triamcinolone  cream (KENALOG ) 0.1 % Apply topically 2 (two) times daily. 30 g 1   No current facility-administered medications on file prior to visit.   Allergies  Allergen Reactions   Prevalite  [Cholestyramine  Light] Hives   Ace Inhibitors Rash   Social History   Socioeconomic History   Marital status: Married    Spouse name: Not on file   Number of children: Not on file   Years of education: Not on file   Highest education level: Not on file  Occupational History   Not on file  Tobacco Use   Smoking status: Former    Current packs/day: 0.00    Average packs/day: 1 pack/day for 45.7 years (45.7 ttl pk-yrs)    Types: Cigarettes    Start date: 01/29/1965    Quit date: 10/30/2010    Years since quitting: 12.2   Smokeless tobacco: Never  Substance and Sexual Activity   Alcohol use: No    Alcohol/week: 0.0 standard drinks of alcohol   Drug use: No   Sexual activity: Not on file  Other Topics Concern   Not on file  Social History Narrative   Entered 07/2013:   Is retired. Keeps [facility]. Loves it.   Married.   Social Drivers of Corporate Investment Banker Strain: Low Risk  (05/27/2020)   Overall Financial Resource Strain (CARDIA)    Difficulty of Paying Living Expenses: Not hard at all  Food Insecurity: No Food Insecurity (05/27/2020)   Hunger Vital Sign    Worried About Running Out of Food  in the Last Year: Never true    Ran Out of Food in the Last Year: Never true  Transportation Needs: No Transportation Needs (05/27/2020)   PRAPARE - Administrator, Civil Service (Medical): No    Lack of Transportation (Non-Medical): No  Physical Activity: Inactive (05/27/2020)   Exercise Vital Sign    Days of Exercise per Week: 0 days    Minutes of Exercise per Session: 0 min  Stress: No Stress Concern Present (05/27/2020)   [employer] of Occupational Health - Occupational Stress Questionnaire    Feeling of Stress : Not  at all  Social Connections: Moderately Isolated (05/27/2020)   Social Connection and Isolation Panel [NHANES]    Frequency of Communication with Friends and Family: More than three times a week    Frequency of Social Gatherings with Friends and Family: More than three times a week    Attends Religious Services: Never    Database Administrator or Organizations: No    Attends Banker Meetings: Never    Marital Status: Married  Catering Manager Violence: Not At Risk (05/27/2020)   Humiliation, Afraid, Rape, and Kick questionnaire    Fear of Current or Ex-Partner: No    Emotionally Abused: No    Physically Abused: No    Sexually Abused: No     Review of Systems  All other systems reviewed and are negative.      Objective:   Physical Exam Constitutional:      General: She is not in acute distress.    Appearance: Normal appearance. She is not ill-appearing, toxic-appearing or diaphoretic.  HENT:     Nose: Congestion present.  Cardiovascular:     Rate and Rhythm: Normal rate and regular rhythm.     Heart sounds: Normal heart sounds. No murmur heard. Pulmonary:     Effort: Pulmonary effort is normal. No respiratory distress.     Breath sounds: Normal breath sounds. No stridor. No wheezing, rhonchi or rales.  Chest:     Chest wall: No tenderness.  Abdominal:     General: Abdomen is flat. Bowel sounds are normal. There is no distension.     Palpations: Abdomen is soft.     Tenderness: There is no abdominal tenderness. There is no right CVA tenderness or guarding.  Neurological:     Mental Status: She is alert.           Assessment & Plan:  Encounter for screening mammogram for malignant neoplasm of breast - Plan: MM 3D SCREENING MAMMOGRAM BILATERAL BREAST  Diabetes mellitus type 2 with complications (HCC)  Benign essential HTN  General medical exam Blood pressure today is excellent.  Patient received her flu shot today.  She declines the shingles vaccine.   Schedule patient for mammogram.  Colonoscopy is up-to-date.  We will discontinue Jardiance  and replace with Mounjaro  2.5 mg subcu weekly.  Uptitrate as quickly as possible to 7.5 mg.  Recheck hemoglobin A1c in 4 months.  Increase gabapentin  to 300 mg 3 times a day as needed for nerve pain.  Cholesterol is acceptable.

## 2023-02-13 ENCOUNTER — Telehealth: Payer: Self-pay

## 2023-02-13 NOTE — Telephone Encounter (Signed)
 Amanda Pope (KeyKristan Petit)  Your information has been sent to [facility] [employer]

## 2023-02-14 ENCOUNTER — Telehealth: Payer: Self-pay | Admitting: Family Medicine

## 2023-02-15 ENCOUNTER — Other Ambulatory Visit: Payer: Self-pay | Admitting: Family Medicine

## 2023-02-15 ENCOUNTER — Other Ambulatory Visit: Payer: Self-pay

## 2023-02-15 DIAGNOSIS — I10 Essential (primary) hypertension: Secondary | ICD-10-CM

## 2023-02-15 DIAGNOSIS — Z79890 Hormone replacement therapy (postmenopausal): Secondary | ICD-10-CM

## 2023-02-15 DIAGNOSIS — L501 Idiopathic urticaria: Secondary | ICD-10-CM

## 2023-02-15 MED ORDER — ESTRADIOL 0.5 MG PO TABS: 0.5000 mg | ORAL_TABLET | Freq: Every day | ORAL | 1 refills | Status: DC

## 2023-02-15 MED ORDER — HYDROCHLOROTHIAZIDE 25 MG PO TABS: 25.0000 mg | ORAL_TABLET | Freq: Every day | ORAL | 1 refills | Status: DC

## 2023-02-15 MED ORDER — LEVOCETIRIZINE DIHYDROCHLORIDE 5 MG PO TABS: ORAL_TABLET | ORAL | 1 refills | Status: DC

## 2023-02-15 MED ORDER — LOSARTAN POTASSIUM 100 MG PO TABS: 100.0000 mg | ORAL_TABLET | Freq: Every day | ORAL | 1 refills | Status: DC

## 2023-02-23 ENCOUNTER — Other Ambulatory Visit: Payer: Self-pay | Admitting: Family Medicine

## 2023-02-23 DIAGNOSIS — E118 Type 2 diabetes mellitus with unspecified complications: Secondary | ICD-10-CM

## 2023-02-28 ENCOUNTER — Ambulatory Visit: Payer: Self-pay | Admitting: Family Medicine

## 2023-02-28 NOTE — Telephone Encounter (Signed)
  Chief Complaint: Dysuria Symptoms: frequency, urgency Frequency: Started today Pertinent Negatives: Patient denies fever, hematuria Disposition: [] ED /[] Urgent Care (no appt availability in office) / [x] Appointment(In office/virtual)/ []  [facility]/ [] Home Care/ [] Refused Recommended Disposition /[] [facility]/ []  Follow-up with PCP Additional Notes: Pt reports dysuria, frequency and urgency that began about 2 hours ago. Pt denies fever, hematuria, flank pain. OV scheduled tomorrow AM. This RN educated pt on home care, new-worsening symptoms, when to call back/seek emergent care. Pt verbalized understanding and agrees to plan.    Reason for Disposition  Urinating more frequently than usual (i.e., frequency)  Answer Assessment - Initial Assessment Questions 1. SYMPTOM: "What's the main symptom you're concerned about?" (e.g., frequency, incontinence)     Dysuria. urgency 2. ONSET: "When did the  symptoms  start?"     About 2 hours ago 3. PAIN: "Is there any pain?" If Yes, ask: "How bad is it?" (Scale: 1-10; mild, moderate, severe)     Yes, 8/10 4. CAUSE: "What do you think is causing the symptoms?"     Possible UTI 5. OTHER SYMPTOMS: "Do you have any other symptoms?" (e.g., blood in urine, fever, flank pain, pain with urination)     None  Protocols used: Urinary Symptoms-A-AH

## 2023-03-01 ENCOUNTER — Ambulatory Visit: Payer: PPO | Admitting: Family Medicine

## 2023-03-14 ENCOUNTER — Telehealth: Payer: Self-pay | Admitting: Family Medicine

## 2023-03-14 NOTE — Telephone Encounter (Signed)
Copied from CRM [id number]. Topic: Clinical - Medication Question >> Mar 14, 2023  8:50 AM Amanda Pope wrote: Reason for CRM:   patient is on the tirzepatide [facility]) 2.5 MG/0.5ML Pen on yesterday the push the buttom and without waiting immediately pull it out want to know if should take another dose

## 2023-03-14 NOTE — Telephone Encounter (Signed)
Patient is supposed to take [facility] daily. She stated she attempted to use it yesterday and when she pushed the button, the medication spilled everywhere and she did not receive the dose. Patient wants to know what she should do next.

## 2023-03-15 ENCOUNTER — Other Ambulatory Visit: Payer: Self-pay | Admitting: Family Medicine

## 2023-03-15 ENCOUNTER — Telehealth: Payer: Self-pay

## 2023-03-15 MED ORDER — TIRZEPATIDE 5 MG/0.5ML ~~LOC~~ SOAJ: 5.0000 mg | SUBCUTANEOUS | 1 refills | Status: DC

## 2023-03-15 NOTE — Telephone Encounter (Signed)
Copied from CRM [id number]. Topic: Clinical - Prescription Issue >> Mar 15, 2023 10:51 AM Gery Pray wrote: Reason for CRM: Patient is supposed to take [facility] daily. She stated she attempted to use it yesterday and when she pushed the button, the medication spilled everywhere and she did not receive the dose. Patient wants to know what she should do next. Patient states that she is needing a refill on the [facility] and the dosage to be moved up to the next step. Patient thinks that the next step is a 3.5.  03/15/23  Spoke w/pt pt this morning: per pt stated that per pcp, for pt to call w/in two weeks if pt is tolerating 2.5mg  of Mounjaro. Pcp will increase pt to the next dose. Per pt feels ok, tolerating well, have not had any side of effects. Pt stated that she has 1 pen left of 2.5mg  left. Told pt to go ahead and finished what she had left of the 2.5mg  and we'll call in the next dose to her pharmacy. Pt respond, ok.

## 2023-03-26 ENCOUNTER — Ambulatory Visit
Admission: RE | Admit: 2023-03-26 | Discharge: 2023-03-26 | Disposition: A | Discharge status: Home / Self Care | Payer: PPO | Source: Ambulatory Visit | Attending: Family Medicine | Admitting: Family Medicine

## 2023-03-26 DIAGNOSIS — Z1231 Encounter for screening mammogram for malignant neoplasm of breast: Secondary | ICD-10-CM

## 2023-04-14 ENCOUNTER — Other Ambulatory Visit: Payer: Self-pay | Admitting: Family Medicine

## 2023-04-16 NOTE — Telephone Encounter (Signed)
 Too soon for refill, last refill 03/22/23 for 48 ml and 2 RF.  Requested Prescriptions  Pending Prescriptions Disp Refills   fluticasone (FLONASE) 50 MCG/ACT nasal spray [Pharmacy Med Name: FLUTICASONE PROP 50 MCG SPRAY] 48 mL 2    Sig: SPRAY 2 SPRAYS INTO EACH NOSTRIL EVERY DAY     Ear, Nose, and Throat: Nasal Preparations - Corticosteroids Failed - 04/16/2023 11:18 AM      Failed - Valid encounter within last 12 months    Recent Outpatient Visits           2 years ago Diabetes mellitus type 2 with complications (HCC)   [facility] Donita Brooks, MD   3 years ago Diabetes mellitus type 2 with complications (HCC)   [facility] Pickard, Priscille Heidelberg, MD   3 years ago Elevated liver enzymes   [facility] Tanya Nones, Priscille Heidelberg, MD   4 years ago Subclinical hypothyroidism   [facility] Donita Brooks, MD   4 years ago Right-sided low back pain without sciatica, unspecified chronicity   [facility] Pickard, Priscille Heidelberg, MD

## 2023-05-10 ENCOUNTER — Encounter: Payer: Self-pay | Admitting: Family Medicine

## 2023-05-10 ENCOUNTER — Ambulatory Visit: Admitting: Family Medicine

## 2023-05-10 VITALS — BP 136/80 | HR 85 | Temp 97.6°F | Ht 65.0 in | Wt 196.4 lb

## 2023-05-10 DIAGNOSIS — E118 Type 2 diabetes mellitus with unspecified complications: Secondary | ICD-10-CM

## 2023-05-10 DIAGNOSIS — Z7984 Long term (current) use of oral hypoglycemic drugs: Secondary | ICD-10-CM

## 2023-05-10 MED ORDER — TIRZEPATIDE 7.5 MG/0.5ML ~~LOC~~ SOAJ: 7.5000 mg | SUBCUTANEOUS | 5 refills | Status: DC

## 2023-05-10 NOTE — Progress Notes (Signed)
 Wt Readings from Last 3 Encounters:  05/10/23 196 lb 6 oz (89.1 kg)  02/12/23 203 lb (92.1 kg)  10/16/22 201 lb (91.2 kg)     Subjective:    Patient ID: Amanda Pope, female    DOB: 1945/12/25, 78 y.o.   MRN: [id number]  Patient currently on mounjaro 5 mg sq weekly.  Has adjusted to the medications and nausea and diarrhea have improved.  She is checking her sugar and the readings are much better.  She denies hypoglycemia and would liek to get off metformin if possible.   Past Medical History:  Diagnosis Date   Arthritis    hip & back   Asthma    COPD (chronic obstructive pulmonary disease) (HCC)    Cystocele    Depression    Diabetes mellitus without complication (HCC)    Phreesia 05/27/2020   GERD (gastroesophageal reflux disease)    History of Helicobacter pylori infection    Hypertension    Shortness of breath dyspnea    Urticaria    Viral URI with cough 10/16/2022   Vitamin D deficiency    Past Surgical History:  Procedure Laterality Date   ABDOMINAL HYSTERECTOMY     And Removed 1 ovary   APPENDECTOMY     CHOLECYSTECTOMY  05/2007   TONSILLECTOMY     TOTAL HIP ARTHROPLASTY Right 06/09/2015   TOTAL HIP ARTHROPLASTY Right 06/09/2015   Procedure: RIGHT TOTAL HIP ARTHROPLASTY ANTERIOR APPROACH;  Surgeon: Tarry Kos, MD;  Location: [facility] OR;  Service: Orthopedics;  Laterality: Right;   TUBAL LIGATION     Current Outpatient Medications on File Prior to Visit  Medication Sig Dispense Refill   Accu-Chek FastClix Lancets MISC Use to check FBS daily DX: E11.9 100 each 3   albuterol (VENTOLIN HFA) 108 (90 Base) MCG/ACT inhaler Inhale 2 puffs into the lungs every 6 (six) hours as needed for wheezing or shortness of breath. 2 each 3   aspirin EC 325 MG tablet Take 1 tablet (325 mg total) by mouth 2 (two) times daily. 84 tablet 0   blood glucose meter kit and supplies Dispense based on patient and insurance preference. Use up to 1 time daily as directed. (FOR ICD-10 E10.9, E11.9). 1  each 0   Blood Glucose Monitoring Suppl (ACCU-CHEK AVIVA PLUS) w/Device KIT Use to check FBS daily DX: E11.9 1 kit 1   Budeson-Glycopyrrol-Formoterol (BREZTRI AEROSPHERE) 160-9-4.8 MCG/ACT AERO Inhale 2 puffs into the lungs 2 (two) times daily. 10.7 g 11   calcium gluconate 500 MG tablet Take 1 tablet (500 mg total) by mouth 3 (three) times daily. 30 tablet 11   CVS D3 2000 units CAPS TAKE 2 CAPSULES BY MOUTH ONCE A DAY 60 capsule 9   estradiol (ESTRACE) 0.5 MG tablet Take 1 tablet (0.5 mg total) by mouth daily. 90 tablet 1   fluticasone (FLONASE) 50 MCG/ACT nasal spray SPRAY 2 SPRAYS INTO EACH NOSTRIL EVERY DAY 48 mL 2   gabapentin (NEURONTIN) 300 MG capsule Take 1 capsule (300 mg total) by mouth 3 (three) times daily. 90 capsule 3   glucose blood (ACCU-CHEK GUIDE) test strip USE TO CHECK FBS DAILY DX: E11.9 100 strip 12   hydrochlorothiazide (HYDRODIURIL) 25 MG tablet Take 1 tablet (25 mg total) by mouth daily. 90 tablet 1   Lancets Misc. (ACCU-CHEK FASTCLIX LANCET) KIT Use to check FBS daily DX: E11.9 1 kit 1   levocetirizine (XYZAL) 5 MG tablet TAKE 1 TABLET BY MOUTH EVERY DAY IN THE EVENING  90 tablet 1   losartan (COZAAR) 100 MG tablet Take 1 tablet (100 mg total) by mouth daily. 90 tablet 1   metFORMIN (GLUCOPHAGE) 1000 MG tablet TAKE 1 TABLET (1,000 MG TOTAL) BY MOUTH TWICE A DAY WITH FOOD 180 tablet 1   omeprazole (PRILOSEC) 20 MG capsule Take 1 capsule (20 mg total) by mouth daily. 30 capsule 1   ondansetron (ZOFRAN) 4 MG tablet Take 1 tablet (4 mg total) by mouth every 8 (eight) hours as needed for nausea or vomiting. 20 tablet 0   triamcinolone cream (KENALOG) 0.1 % Apply topically 2 (two) times daily. 30 g 1   No current facility-administered medications on file prior to visit.     Allergies  Allergen Reactions   Prevalite [Cholestyramine Light] Hives   Ace Inhibitors Rash   Social History   Socioeconomic History   Marital status: Married    Spouse name: Not on file    Number of children: Not on file   Years of education: Not on file   Highest education level: Not on file  Occupational History   Not on file  Tobacco Use   Smoking status: Former    Current packs/day: 0.00    Average packs/day: 1 pack/day for 45.7 years (45.7 ttl pk-yrs)    Types: Cigarettes    Start date: 01/29/1965    Quit date: 10/30/2010    Years since quitting: 12.5   Smokeless tobacco: Never  Substance and Sexual Activity   Alcohol use: No    Alcohol/week: 0.0 standard drinks of alcohol   Drug use: No   Sexual activity: Not on file  Other Topics Concern   Not on file  Social History Narrative   Entered 07/2013:   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   Married.   Social Drivers of Corporate investment banker Strain: Low Risk  (05/27/2020)   Overall Financial Resource Strain (CARDIA)    Difficulty of Paying Living Expenses: Not hard at all  Food Insecurity: No Food Insecurity (05/27/2020)   Hunger Vital Sign    Worried About Running Out of Food in the Last Year: Never true    Ran Out of Food in the Last Year: Never true  Transportation Needs: No Transportation Needs (05/27/2020)   PRAPARE - Administrator, Civil Service (Medical): No    Lack of Transportation (Non-Medical): No  Physical Activity: Inactive (05/27/2020)   Exercise Vital Sign    Days of Exercise per Week: 0 days    Minutes of Exercise per Session: 0 min  Stress: No Stress Concern Present (05/27/2020)   [employer] of Occupational Health - Occupational Stress Questionnaire    Feeling of Stress : Not at all  Social Connections: Moderately Isolated (05/27/2020)   Social Connection and Isolation Panel [NHANES]    Frequency of Communication with Friends and Family: More than three times a week    Frequency of Social Gatherings with Friends and Family: More than three times a week    Attends Religious Services: Never    Database administrator or Organizations: No    Attends Tax inspector Meetings: Never    Marital Status: Married  Catering manager Violence: Not At Risk (05/27/2020)   Humiliation, Afraid, Rape, and Kick questionnaire    Fear of Current or Ex-Partner: No    Emotionally Abused: No    Physically Abused: No    Sexually Abused: No     Review of Systems  All other systems reviewed and are negative.      Objective:   Physical Exam Constitutional:      General: She is not in acute distress.    Appearance: Normal appearance. She is not ill-appearing, toxic-appearing or diaphoretic.  HENT:     Nose: Congestion present.  Cardiovascular:     Rate and Rhythm: Normal rate and regular rhythm.     Heart sounds: Normal heart sounds. No murmur heard. Pulmonary:     Effort: Pulmonary effort is normal. No respiratory distress.     Breath sounds: Normal breath sounds. No stridor. No wheezing, rhonchi or rales.  Chest:     Chest wall: No tenderness.  Abdominal:     General: Abdomen is flat. Bowel sounds are normal. There is no distension.     Palpations: Abdomen is soft.     Tenderness: There is no abdominal tenderness. There is no right CVA tenderness or guarding.  Neurological:     Mental Status: She is alert.           Assessment & Plan:  Diabetes mellitus type 2 with complications (HCC) - Plan: CBC with Differential/Platelet, COMPLETE METABOLIC PANEL WITHOUT GFR, Lipid panel, Hemoglobin A1c Check hemoglobin A1c today.  Blood pressure is excellent.  Up titrate mounjaro to 7.5 mg sq weekly and recheck hga1c in 3 months.  Decrease metformin to 1/2 pill daily and if sugars remain well controlled, we will plane to discontinue metformin next month and up titrate mounjaro to 10 mg sq weekly.

## 2023-05-11 LAB — CBC WITH DIFFERENTIAL/PLATELET
Absolute Lymphocytes: 2730 {cells}/uL (ref 850–3900)
Absolute Monocytes: 630 {cells}/uL (ref 200–950)
Basophils Absolute: 80 {cells}/uL (ref 0–200)
Basophils Relative: 0.8 %
Eosinophils Absolute: 890 {cells}/uL — ABNORMAL HIGH (ref 15–500)
Eosinophils Relative: 8.9 %
HCT: 39.4 % (ref 35.0–45.0)
Hemoglobin: 12.5 g/dL (ref 11.7–15.5)
MCH: 27.1 pg (ref 27.0–33.0)
MCHC: 31.7 g/dL — ABNORMAL LOW (ref 32.0–36.0)
MCV: 85.3 fL (ref 80.0–100.0)
MPV: 10.4 fL (ref 7.5–12.5)
Monocytes Relative: 6.3 %
Neutro Abs: 5670 {cells}/uL (ref 1500–7800)
Neutrophils Relative %: 56.7 %
Platelets: 290 10*3/uL (ref 140–400)
RBC: 4.62 10*6/uL (ref 3.80–5.10)
RDW: 13.8 % (ref 11.0–15.0)
Total Lymphocyte: 27.3 %
WBC: 10 10*3/uL (ref 3.8–10.8)

## 2023-05-11 LAB — COMPLETE METABOLIC PANEL WITHOUT GFR
AG Ratio: 1.5 (calc) (ref 1.0–2.5)
ALT: 15 U/L (ref 6–29)
AST: 15 U/L (ref 10–35)
Albumin: 4 g/dL (ref 3.6–5.1)
Alkaline phosphatase (APISO): 87 U/L (ref 37–153)
BUN/Creatinine Ratio: 15 (calc) (ref 6–22)
BUN: 19 mg/dL (ref 7–25)
CO2: 31 mmol/L (ref 20–32)
Calcium: 9.3 mg/dL (ref 8.6–10.4)
Chloride: 101 mmol/L (ref 98–110)
Creat: 1.26 mg/dL — ABNORMAL HIGH (ref 0.60–1.00)
Globulin: 2.7 g/dL (ref 1.9–3.7)
Glucose, Bld: 93 mg/dL (ref 65–99)
Potassium: 3.9 mmol/L (ref 3.5–5.3)
Sodium: 140 mmol/L (ref 135–146)
Total Bilirubin: 0.9 mg/dL (ref 0.2–1.2)
Total Protein: 6.7 g/dL (ref 6.1–8.1)

## 2023-05-11 LAB — HEMOGLOBIN A1C
Hgb A1c MFr Bld: 6.4 %{Hb} — ABNORMAL HIGH (ref <5.7)
Mean Plasma Glucose: 137 mg/dL
eAG (mmol/L): 7.6 mmol/L

## 2023-05-11 LAB — LIPID PANEL
Cholesterol: 152 mg/dL (ref <200)
HDL: 42 mg/dL — ABNORMAL LOW (ref >=50)
LDL Cholesterol (Calc): 85 mg/dL
Non-HDL Cholesterol (Calc): 110 mg/dL (ref <130)
Total CHOL/HDL Ratio: 3.6 (calc) (ref <5.0)
Triglycerides: 153 mg/dL — ABNORMAL HIGH (ref <150)

## 2023-05-16 ENCOUNTER — Ambulatory Visit

## 2023-05-23 ENCOUNTER — Other Ambulatory Visit: Payer: Self-pay | Admitting: Family Medicine

## 2023-05-24 NOTE — Telephone Encounter (Signed)
 Requested medication (s) are due for refill today: Yes  Requested medication (s) are on the active medication list: Yes  Last refill:  04/03/22  Future visit scheduled: No  Notes to clinic:  Unable to refill due to no refill protocol for this medication.      Requested Prescriptions  Pending Prescriptions Disp Refills   ACCU-CHEK GUIDE TEST test strip [Pharmacy Med Name: ACCU-CHEK GUIDE TEST STRIP] 100 strip 12    Sig: USE TO CHECK FBS DAILY DX: E11.9     There is no refill protocol information for this order

## 2023-06-03 ENCOUNTER — Other Ambulatory Visit: Payer: Self-pay

## 2023-06-03 DIAGNOSIS — E118 Type 2 diabetes mellitus with unspecified complications: Secondary | ICD-10-CM

## 2023-06-03 MED ORDER — BLOOD GLUCOSE TEST STRIPS 333 VI STRP: ORAL_STRIP | 3 refills | Status: DC

## 2023-06-03 MED ORDER — BLOOD GLUCOSE TEST STRIPS 333 VI STRP: ORAL_STRIP | 3 refills | Status: AC

## 2023-06-07 ENCOUNTER — Ambulatory Visit: Payer: Self-pay | Admitting: Family Medicine

## 2023-06-07 NOTE — Telephone Encounter (Signed)
 Chief Complaint: Pain/pressure (8/10 pain level) with urination started yesterday  Symptoms: Urinary retention started today Pertinent Negatives: Patient denies fever, lower back pain  Disposition: /[x] Urgent Care (no appt availability in office)   Additional Notes: Pt states she is drinking a lot of water. Pt was able to urinate while on phone and states she had more than a few drops. This RN advised pt to go to urgent care as no appointment availability in office. Pt is agreeable to plan.    Copied from CRM [id number]. Topic: Clinical - Red Word Triage >> Jun 07, 2023 12:10 PM Fredrica W wrote: Red Word that prompted transfer to Nurse Triage: Terrible Pain with urinating , now if feels like she has to urinate but is not able to. Reason for Disposition  [1] SEVERE pain with urination (e.g., excruciating) AND [2] not improved after 2 hours of pain medicine and Sitz bath  Answer Assessment - Initial Assessment Questions Chief Complaint: Pain/pressure (8/10 pain level) with urination started yesterday  Symptoms: Urinary retention started today  Pertinent Negatives: Patient denies fever, lower back pain  Protocols used: Urination Pain - Female-A-AH

## 2023-06-17 ENCOUNTER — Other Ambulatory Visit: Payer: Self-pay | Admitting: Family Medicine

## 2023-06-19 ENCOUNTER — Telehealth: Payer: Self-pay

## 2023-06-19 NOTE — Telephone Encounter (Signed)
 Requested medication (s) are due for refill today: yes  Requested medication (s) are on the active medication list: yes  Last refill:  05/17/22 10.7 11 RF  Future visit scheduled: no  Notes to clinic:  med not assigned to a protocol   Requested Prescriptions  Pending Prescriptions Disp Refills   BREZTRI  AEROSPHERE 160-9-4.8 MCG/ACT AERO inhaler [Pharmacy Med Name: BREZTRI  AEROSPHERE INHALER] 10.7 each 11    Sig: INHALE 2 PUFFS INTO THE LUNGS TWICE A DAY     Off-Protocol Failed - 06/19/2023  1:29 PM      Failed - Medication not assigned to a protocol, review manually.      Passed - Valid encounter within last 12 months    Recent Outpatient Visits           1 month ago Diabetes mellitus type 2 with complications [facility])   [place] [facility] Pickard, Cisco Crest, MD   4 months ago Encounter for screening mammogram for malignant neoplasm of breast   [place] [facility] Medicine Austine Lefort, MD   8 months ago Viral URI with cough   [place] [facility] Jenelle Mis, FNP   1 year ago Diabetes mellitus type 2 with complications [facility])   [place] [facility] Austine Lefort, MD   1 year ago Diabetes mellitus type 2 with complications [facility])   [place] [facility] Pickard, Cisco Crest, MD

## 2023-06-19 NOTE — Telephone Encounter (Signed)
 Copied from CRM [id number]. Topic: Clinical - Medication Question >> Jun 19, 2023  2:35 PM Georgeann Kindred wrote: Reason for CRM: Patient is requesting an antibiotic for an UTI. Patient states she has another one. Please contact patient at [phone] for additional information.

## 2023-06-20 ENCOUNTER — Other Ambulatory Visit: Payer: Self-pay | Admitting: Family Medicine

## 2023-06-20 MED ORDER — SULFAMETHOXAZOLE-TRIMETHOPRIM 800-160 MG PO TABS: 1.0000 | ORAL_TABLET | Freq: Two times a day (BID) | ORAL | 0 refills | Status: DC

## 2023-06-26 ENCOUNTER — Other Ambulatory Visit: Payer: Self-pay

## 2023-06-26 ENCOUNTER — Telehealth: Payer: Self-pay | Admitting: Family Medicine

## 2023-06-26 MED ORDER — TIRZEPATIDE 10 MG/0.5ML ~~LOC~~ SOAJ
10.0000 mg | SUBCUTANEOUS | 1 refills | Status: DC
Start: 2023-06-26 — End: 2023-07-22

## 2023-06-26 NOTE — Telephone Encounter (Signed)
 Copied from CRM [id number]. Topic: Clinical - Medication Question >> Jun 26, 2023  1:28 PM Chrystal Crape R wrote: Pt would like increase tirzepatide (MOUNJARO) 7.5 MG/0.5ML Pen to the 10MG  as discussed with pcp.  Please send to  [id number], [place] - [address] [place] [address] Phone: [phone] Notify pt when it has been sent

## 2023-07-07 ENCOUNTER — Other Ambulatory Visit: Payer: Self-pay | Admitting: Family Medicine

## 2023-07-07 DIAGNOSIS — K219 Gastro-esophageal reflux disease without esophagitis: Secondary | ICD-10-CM

## 2023-07-22 ENCOUNTER — Telehealth: Payer: Self-pay

## 2023-07-22 ENCOUNTER — Other Ambulatory Visit: Payer: Self-pay | Admitting: Family Medicine

## 2023-07-22 MED ORDER — TIRZEPATIDE 12.5 MG/0.5ML ~~LOC~~ SOAJ
12.5000 mg | SUBCUTANEOUS | 3 refills | Status: DC
Start: 2023-07-22 — End: 2023-09-12

## 2023-07-22 NOTE — Telephone Encounter (Signed)
 Copied from CRM [id number]. Topic: Clinical - Medication Question >> Jul 22, 2023 12:00 PM Elle L wrote: Reason for CRM: The patient is requesting for her tirzepatide  (MOUNJARO ) 10 MG/0.5ML Pen to be increased. The patient's call back number is [phone].   Preferred Pharmacy: [id number] [place], [place] - [address] [place] [address] Phone: [phone]  Fax: [phone]

## 2023-07-29 NOTE — Telephone Encounter (Signed)
Medication refilled by provider

## 2023-08-03 ENCOUNTER — Other Ambulatory Visit: Payer: Self-pay | Admitting: Family Medicine

## 2023-08-05 ENCOUNTER — Other Ambulatory Visit: Payer: Self-pay | Admitting: Family Medicine

## 2023-09-04 ENCOUNTER — Telehealth: Payer: Self-pay | Admitting: Pharmacy Technician

## 2023-09-04 ENCOUNTER — Other Ambulatory Visit (HOSPITAL_COMMUNITY): Payer: Self-pay

## 2023-09-04 NOTE — Telephone Encounter (Signed)
 Pharmacy Patient Advocate Encounter   Received notification from Onbase that prior authorization for Accu-chek guide test strips is required/requested.   Insurance verification completed.   The patient is insured through [facility] ADVANTAGE/RX ADVANCE .   Per test claim:  Onetouch Verio Flex System w/ Device Kit is preferred by the insurance.  If suggested medication is appropriate, Please send in a new RX and discontinue this one. If not, please advise as to why it's not appropriate so that we may request a Prior Authorization. Please note, some preferred medications may still require a PA.  If the suggested medications have not been trialed and there are no contraindications to their use, the PA will not be submitted, as it will not be approved.   Also please send a new RX for OneTouch Verio Test strips and lancets

## 2023-09-05 ENCOUNTER — Other Ambulatory Visit: Payer: Self-pay

## 2023-09-05 MED ORDER — ONETOUCH VERIO FLEX SYSTEM W/DEVICE KIT: PACK | 0 refills | Status: AC

## 2023-09-05 NOTE — Telephone Encounter (Signed)
 Sent in new blood sugar monitoring kit.

## 2023-09-11 ENCOUNTER — Ambulatory Visit

## 2023-09-11 ENCOUNTER — Telehealth: Payer: Self-pay

## 2023-09-11 VITALS — Ht 65.0 in | Wt 196.0 lb

## 2023-09-11 DIAGNOSIS — Z0000 Encounter for general adult medical examination without abnormal findings: Secondary | ICD-10-CM

## 2023-09-11 NOTE — Patient Instructions (Signed)
 Amanda Pope , Thank you for taking time out of your busy schedule to complete your Annual Wellness Visit with me. I enjoyed our conversation and look forward to speaking with you again next year. I, as well as your care team,  appreciate your ongoing commitment to your health goals. Please review the following plan we discussed and let me know if I can assist you in the future. Your Game plan/ To Do List     Follow up Visits: We will see or speak with you next year for your Next Medicare AWV with our clinical staff Have you seen your provider in the last 6 months (3 months if uncontrolled diabetes)? Yes  Clinician Recommendations:  Aim for 30 minutes of exercise or brisk walking, 6-8 glasses of water, and 5 servings of fruits and vegetables each day.       This is a list of the screenings recommended for you:  Health Maintenance  Topic Date Due   Yearly kidney health urinalysis for diabetes  Never done   Zoster (Shingles) Vaccine (1 of 2) Never done   COVID-19 Vaccine (1 - 2024-25 season) Never done   DTaP/Tdap/Td vaccine (2 - Td or Tdap) 08/28/2023   Flu Shot  08/30/2023   Screening for Lung Cancer  02/12/2024*   Yearly kidney function blood test for diabetes  05/09/2024   Medicare Annual Wellness Visit  09/10/2024   Pneumococcal Vaccine for age over 61  Completed   DEXA scan (bone density measurement)  Completed   Hepatitis C Screening  Completed   Hepatitis B Vaccine  Aged Out   HPV Vaccine  Aged Out   Meningitis B Vaccine  Aged Out   Colon Cancer Screening  Discontinued  *Topic was postponed. The date shown is not the original due date.    Advanced directives: (ACP Link)Information on Advanced Care Planning can be found at   Secretary of [facility] Advance Health Care Directives Advance Health Care Directives. [email or web address]   Advance Care Planning is important because it:  [x]  Makes sure you receive the medical care that is consistent with your values, goals, and  preferences  [x]  It provides guidance to your family and loved ones and reduces their decisional burden about whether or not they are making the right decisions based on your wishes.  Follow the link provided in your after visit summary or read over the paperwork we have mailed to you to help you started getting your Advance Directives in place. If you need assistance in completing these, please reach out to us  so that we can help you!  See attachments for Preventive Care and Fall Prevention Tips.

## 2023-09-11 NOTE — Telephone Encounter (Signed)
 Patient seen for AWV and is asking for next strength of Mounjaro  to be sent in to pharmacy.

## 2023-09-11 NOTE — Progress Notes (Signed)
 Subjective:   Amanda Pope is a 78 y.o. who presents for a Medicare Wellness preventive visit.  As a reminder, Annual Wellness Visits don't include a physical exam, and some assessments may be limited, especially if this visit is performed virtually. We may recommend an in-person follow-up visit with your provider if needed.  Visit Complete: Virtual I connected with  Amanda Pope on 09/11/23 by a audio enabled telemedicine application and verified that I am speaking with the correct person using two identifiers.  Patient Location: Home  Provider Location: Home Office  I discussed the limitations of evaluation and management by telemedicine. The patient expressed understanding and agreed to proceed.  Vital Signs: Because this visit was a virtual/telehealth visit, some criteria may be missing or patient reported. Any vitals not documented were not able to be obtained and vitals that have been documented are patient reported.  VideoDeclined- This patient declined Librarian, academic. Therefore the visit was completed with audio only.  Persons Participating in Visit: Patient.  AWV Questionnaire: No: Patient Medicare AWV questionnaire was not completed prior to this visit.  Cardiac Risk Factors include: advanced age (>13men, >23 women);dyslipidemia;hypertension     Objective:    Today's Vitals   09/11/23 1417  Weight: 196 lb (88.9 kg)  Height: 5' 5 (1.651 m)   Body mass index is 32.62 kg/m.     09/11/2023    2:26 PM 05/27/2020   12:14 PM 06/10/2015    9:55 AM 06/02/2015    2:08 PM  Advanced Directives  Does Patient Have a Medical Advance Directive? No No No  No   Would patient like information on creating a medical advance directive? Yes (MAU/Ambulatory/Procedural Areas - Information given) No - Patient declined Yes - Educational materials given  No - patient declined information      Data saved with a previous flowsheet row definition    Current  Medications (verified) Outpatient Encounter Medications as of 09/11/2023  Medication Sig   Accu-Chek FastClix Lancets MISC Use to check FBS daily DX: E11.9   albuterol  (VENTOLIN  HFA) 108 (90 Base) MCG/ACT inhaler Inhale 2 puffs into the lungs every 6 (six) hours as needed for wheezing or shortness of breath.   aspirin  EC 325 MG tablet Take 1 tablet (325 mg total) by mouth 2 (two) times daily.   blood glucose meter kit and supplies Dispense based on patient and insurance preference. Use up to 1 time daily as directed. (FOR ICD-10 E10.9, E11.9).   Blood Glucose Monitoring Suppl (ONETOUCH VERIO FLEX SYSTEM) w/Device KIT Use to check blood sugars daily   BREZTRI  AEROSPHERE 160-9-4.8 MCG/ACT AERO inhaler INHALE 2 PUFFS INTO THE LUNGS TWICE A DAY   calcium  gluconate 500 MG tablet Take 1 tablet (500 mg total) by mouth 3 (three) times daily.   [facility] D3 2000 units CAPS TAKE 2 CAPSULES BY MOUTH ONCE A DAY   estradiol  (ESTRACE ) 0.5 MG tablet Take 1 tablet (0.5 mg total) by mouth daily.   fluticasone  (FLONASE ) 50 MCG/ACT nasal spray SPRAY 2 SPRAYS INTO EACH NOSTRIL EVERY DAY   gabapentin  (NEURONTIN ) 300 MG capsule TAKE 1 CAPSULE BY MOUTH THREE TIMES A DAY   Glucose Blood (BLOOD GLUCOSE TEST STRIPS 333) STRP USE TO CHECK FASTING BLOOD SUGAR DAILY.   hydrochlorothiazide  (HYDRODIURIL ) 25 MG tablet Take 1 tablet (25 mg total) by mouth daily.   Lancets Misc. (ACCU-CHEK FASTCLIX LANCET) KIT Use to check FBS daily DX: E11.9   levocetirizine (XYZAL ) 5 MG tablet  TAKE 1 TABLET BY MOUTH EVERY DAY IN THE EVENING   losartan  (COZAAR ) 100 MG tablet Take 1 tablet (100 mg total) by mouth daily.   omeprazole  (PRILOSEC) 20 MG capsule TAKE 1 CAPSULE BY MOUTH EVERY DAY   sulfamethoxazole -trimethoprim  (BACTRIM  DS) 800-160 MG tablet Take 1 tablet by mouth 2 (two) times daily.   tirzepatide  (MOUNJARO ) 12.5 MG/0.5ML Pen Inject 12.5 mg into the skin once a week.   triamcinolone  cream (KENALOG ) 0.1 % Apply topically 2 (two) times  daily.   metFORMIN  (GLUCOPHAGE ) 1000 MG tablet TAKE 1 TABLET (1,000 MG TOTAL) BY MOUTH TWICE A DAY WITH FOOD (Patient not taking: Reported on 09/11/2023)   ondansetron  (ZOFRAN ) 4 MG tablet Take 1 tablet (4 mg total) by mouth every 8 (eight) hours as needed for nausea or vomiting. (Patient not taking: Reported on 09/11/2023)   No facility-administered encounter medications on file as of 09/11/2023.    Allergies (verified) Prevalite  [cholestyramine  light] and Ace inhibitors   History: Past Medical History:  Diagnosis Date   Allergy    Arthritis    hip & back   Asthma    Cataract    COPD (chronic obstructive pulmonary disease) (HCC)    Cystocele    Depression    Diabetes mellitus without complication (HCC)    Phreesia 05/27/2020   GERD (gastroesophageal reflux disease)    History of Helicobacter pylori infection    Hypertension    Neuromuscular disorder (HCC)    Shortness of breath dyspnea    Urticaria    Viral URI with cough 10/16/2022   Vitamin D  deficiency    Past Surgical History:  Procedure Laterality Date   ABDOMINAL HYSTERECTOMY     And Removed 1 ovary   APPENDECTOMY     CHOLECYSTECTOMY  05/30/2007   EYE SURGERY     JOINT REPLACEMENT     TONSILLECTOMY     TOTAL HIP ARTHROPLASTY Right 06/09/2015   TOTAL HIP ARTHROPLASTY Right 06/09/2015   Procedure: RIGHT TOTAL HIP ARTHROPLASTY ANTERIOR APPROACH;  Surgeon: Kay CHRISTELLA Cummins, MD;  Location: [facility] OR;  Service: Orthopedics;  Laterality: Right;   TUBAL LIGATION     Family History  Problem Relation Age of Onset   Cancer Mother        colon   Cancer Sister        ovarian   Diabetes Sister    Cancer Brother        melanoma   Cancer Brother 61       colon cancer   Breast cancer Neg Hx    Social History   Socioeconomic History   Marital status: Married    Spouse name: Not on file   Number of children: Not on file   Years of education: Not on file   Highest education level: Not on file  Occupational History   Not on  file  Tobacco Use   Smoking status: Former    Current packs/day: 0.00    Average packs/day: 1 pack/day for 45.7 years (45.7 ttl pk-yrs)    Types: Cigarettes    Start date: 01/29/1965    Quit date: 10/30/2010    Years since quitting: 12.8   Smokeless tobacco: Never  Substance and Sexual Activity   Alcohol use: No   Drug use: No   Sexual activity: Yes    Comment: Hystorectomy  Other Topics Concern   Not on file  Social History Narrative   Entered 07/2013:   Is retired. 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 Loves it.  Married.   Social Drivers of Corporate investment banker Strain: Low Risk  (09/11/2023)   Overall Financial Resource Strain (CARDIA)    Difficulty of Paying Living Expenses: Not hard at all  Food Insecurity: No Food Insecurity (09/11/2023)   Hunger Vital Sign    Worried About Running Out of Food in the Last Year: Never true    Ran Out of Food in the Last Year: Never true  Transportation Needs: No Transportation Needs (09/11/2023)   PRAPARE - Administrator, Civil Service (Medical): No    Lack of Transportation (Non-Medical): No  Physical Activity: Insufficiently Active (09/11/2023)   Exercise Vital Sign    Days of Exercise per Week: 2 days    Minutes of Exercise per Session: 60 min  Stress: No Stress Concern Present (09/11/2023)   [employer] of Occupational Health - Occupational Stress Questionnaire    Feeling of Stress: Not at all  Social Connections: Moderately Integrated (09/11/2023)   Social Connection and Isolation Panel    Frequency of Communication with Friends and Family: More than three times a week    Frequency of Social Gatherings with Friends and Family: Twice a week    Attends Religious Services: 1 to 4 times per year    Active Member of [employer] or Organizations: No    Attends Engineer, structural: Never    Marital Status: Married    Tobacco Counseling Counseling given: Not Answered    Clinical Intake:  Pre-visit preparation  completed: Yes  Pain : No/denies pain  Diabetes: No  Lab Results  Component Value Date   HGBA1C 6.4 (H) 05/10/2023   HGBA1C 7.8 (H) 01/14/2023   HGBA1C 7.0 (H) 05/17/2022     How often do you need to have someone help you when you read instructions, pamphlets, or other written materials from your doctor or pharmacy?: 1 - Never  Interpreter Needed?: No  Information entered by :: Amanda Bloodgood LPN   Activities of Daily Living     09/11/2023    2:26 PM  In your present state of health, do you have any difficulty performing the following activities:  Hearing? 0  Vision? 0  Difficulty concentrating or making decisions? 0  Walking or climbing stairs? 0  Dressing or bathing? 0  Doing errands, shopping? 0  Preparing Food and eating ? N  Using the Toilet? N  In the past six months, have you accidently leaked urine? N  Do you have problems with loss of bowel control? N  Managing your Medications? N  Managing your Finances? N  Housekeeping or managing your Housekeeping? N    Patient Care Team: Duanne Butler DASEN, MD as PCP - General (Family Medicine) Kristie Lamprey, MD as Consulting Physician (Gastroenterology)  I have updated your Care Teams any recent Medical Services you may have received from other providers in the past year.     Assessment:   This is a routine wellness examination for Amanda Pope.  Hearing/Vision screen Hearing Screening - Comments:: Denies hearing difficulties   Vision Screening - Comments:: No vision problems; will schedule routine eye exam     Goals Addressed             This Visit's Progress    Maintain health and independence   On track      Depression Screen     09/11/2023    2:27 PM 05/10/2023   11:09 AM 02/12/2023   12:36 PM 01/19/2022   10:25 AM 05/27/2020  12:19 PM 03/07/2017   11:10 AM 02/08/2017    9:22 AM  PHQ 2/9 Scores  PHQ - 2 Score 0 0 0 0 0 0 2  PHQ- 9 Score      0 6    Fall Risk     09/11/2023    2:26 PM 05/10/2023    11:09 AM 02/12/2023   12:36 PM 01/19/2022   10:25 AM 05/27/2020   12:18 PM  Fall Risk   Falls in the past year? 0 0 0 0 0  Number falls in past yr: 0 0 0 0 0  Injury with Fall? 0 0 0 0 0  Risk for fall due to : No Fall Risks  No Fall Risks No Fall Risks No Fall Risks  Follow up Falls prevention discussed;Education provided;Falls evaluation completed  Falls prevention discussed Falls prevention discussed  Falls evaluation completed;Falls prevention discussed      Data saved with a previous flowsheet row definition    MEDICARE RISK AT HOME:  Medicare Risk at Home Any stairs in or around the home?: No If so, are there any without handrails?: No Home free of loose throw rugs in walkways, pet beds, electrical cords, etc?: Yes Adequate lighting in your home to reduce risk of falls?: Yes Life alert?: No Use of a cane, walker or w/c?: No Grab bars in the bathroom?: Yes Shower chair or bench in shower?: No Elevated toilet seat or a handicapped toilet?: Yes  TIMED UP AND GO:  Was the test performed?  No  Cognitive Function: Declined/Normal: No cognitive concerns noted by patient or family. Patient alert, oriented, able to answer questions appropriately and recall recent events. No signs of memory loss or confusion.        05/27/2020   12:24 PM  6CIT Screen  What Year? 0 points  What month? 0 points  What time? 0 points  Count back from 20 0 points  Months in reverse 0 points  Repeat phrase 6 points  Total Score 6 points    Immunizations Immunization History  Administered Date(s) Administered   Fluad Trivalent(High Dose 65+) 02/12/2023   Influenza, High Dose Seasonal PF 11/21/2017   Influenza,inj,Quad PF,6+ Mos 01/12/2014   Pneumococcal Conjugate-13 08/27/2013   Pneumococcal Polysaccharide-23 08/25/2015   Tdap 08/27/2013    Screening Tests Health Maintenance  Topic Date Due   Diabetic kidney evaluation - Urine ACR  Never done   Zoster Vaccines- Shingrix (1 of 2) Never  done   COVID-19 Vaccine (1 - 2024-25 season) Never done   DTaP/Tdap/Td (2 - Td or Tdap) 08/28/2023   INFLUENZA VACCINE  08/30/2023   Lung Cancer Screening  02/12/2024 (Originally 08/18/2008)   Diabetic kidney evaluation - eGFR measurement  05/09/2024   Medicare Annual Wellness (AWV)  09/10/2024   Pneumococcal Vaccine: 50+ Years  Completed   DEXA SCAN  Completed   Hepatitis C Screening  Completed   Hepatitis B Vaccines  Aged Out   HPV VACCINES  Aged Out   Meningococcal B Vaccine  Aged Out   Colonoscopy  Discontinued    Health Maintenance  Health Maintenance Due  Topic Date Due   Diabetic kidney evaluation - Urine ACR  Never done   Zoster Vaccines- Shingrix (1 of 2) Never done   COVID-19 Vaccine (1 - 2024-25 season) Never done   DTaP/Tdap/Td (2 - Td or Tdap) 08/28/2023   INFLUENZA VACCINE  08/30/2023   Health Maintenance Items Addressed: Patient would like to postpone dexa for now; last  was normal   Additional Screening:  Vision Screening: Recommended annual ophthalmology exams for early detection of glaucoma and other disorders of the eye. Would you like a referral to an eye doctor? No    Dental Screening: Recommended annual dental exams for proper oral hygiene  Community Resource Referral / Chronic Care Management: CRR required this visit?  No   CCM required this visit?  No   Plan:    I have personally reviewed and noted the following in the patient's chart:   Medical and social history Use of alcohol, tobacco or illicit drugs  Current medications and supplements including opioid prescriptions. Patient is not currently taking opioid prescriptions. Functional ability and status Nutritional status Physical activity Advanced directives List of other physicians Hospitalizations, surgeries, and ER visits in previous 12 months Vitals Screenings to include cognitive, depression, and falls Referrals and appointments  In addition, I have reviewed and discussed with  patient certain preventive protocols, quality metrics, and best practice recommendations. A written personalized care plan for preventive services as well as general preventive health recommendations were provided to patient.   Lavelle Pfeiffer [place], [place]   1/86/7974   After Visit Summary: (MyChart) Due to this being a telephonic visit, the after visit summary with patients personalized plan was offered to patient via MyChart   Notes: Please refer to Routing Comments.

## 2023-09-12 ENCOUNTER — Other Ambulatory Visit: Payer: Self-pay | Admitting: Family Medicine

## 2023-09-12 MED ORDER — TIRZEPATIDE 15 MG/0.5ML ~~LOC~~ SOAJ
15.0000 mg | SUBCUTANEOUS | 3 refills | Status: AC
Start: 2023-09-12 — End: ?

## 2023-09-19 ENCOUNTER — Other Ambulatory Visit: Payer: Self-pay | Admitting: Family Medicine

## 2023-09-19 DIAGNOSIS — L501 Idiopathic urticaria: Secondary | ICD-10-CM

## 2023-09-19 NOTE — Telephone Encounter (Signed)
 Prescription Request  09/19/2023  LOV: 05/10/2023  What is the name of the medication or equipment?   levocetirizine (XYZAL ) 5 MG tablet  **90 day script requested**  Have you contacted your pharmacy to request a refill? Yes   Which pharmacy would you like this sent to?  [id number] [place], [place] - [address] [place] [address] Phone: [phone] Fax: [phone]    Patient notified that their request is being sent to the clinical staff for review and that they should receive a response within 2 business days.   Please advise pharmacist.

## 2023-09-20 MED ORDER — LEVOCETIRIZINE DIHYDROCHLORIDE 5 MG PO TABS: ORAL_TABLET | ORAL | 1 refills | Status: AC

## 2023-09-20 NOTE — Telephone Encounter (Signed)
 Requested Prescriptions  Pending Prescriptions Disp Refills   levocetirizine (XYZAL ) 5 MG tablet 90 tablet 1    Sig: TAKE 1 TABLET BY MOUTH EVERY DAY IN THE EVENING     Ear, Nose, and Throat:  Antihistamines - levocetirizine dihydrochloride  Failed - 09/20/2023  2:37 PM      Failed - Cr in normal range and within 360 days    Creat  Date Value Ref Range Status  05/10/2023 1.26 (H) 0.60 - 1.00 mg/dL Final   Creatinine, Urine  Date Value Ref Range Status  05/17/2022 121 20 - 275 mg/dL Final         Passed - eGFR is 10 or above and within 360 days    GFR, Est African American  Date Value Ref Range Status  12/04/2019 89 > OR = 60 mL/min/1.54m2 Final   GFR, Est Non African American  Date Value Ref Range Status  12/04/2019 77 > OR = 60 mL/min/1.27m2 Final   eGFR  Date Value Ref Range Status  01/14/2023 57 (L) > OR = 60 mL/min/1.75m2 Final         Passed - Valid encounter within last 12 months    Recent Outpatient Visits           4 months ago Diabetes mellitus type 2 with complications [facility])   [place] [facility] Duanne Butler DASEN, MD   7 months ago Encounter for screening mammogram for malignant neoplasm of breast   [place] [facility] Duanne Butler DASEN, MD   11 months ago Viral URI with cough   [place] [facility] Kayla Jeoffrey RAMAN, FNP   1 year ago Diabetes mellitus type 2 with complications [facility])   [place] [facility] Duanne Butler DASEN, MD   1 year ago Diabetes mellitus type 2 with complications [facility])     Family Medicine [place], Butler DASEN, MD

## 2023-10-16 NOTE — Progress Notes (Signed)
   10/16/2023  Patient ID: Amanda Pope, female   DOB: Feb 23, 1945, 78 y.o.   MRN: [id number]  Pharmacy Quality Measure Review  This patient is appearing on a report for being at risk of failing the adherence measure for hypertension (ACEi/ARB) medications this calendar year.   Medication: losartan  Last fill date: 09/19/23 for 90 day supply  Insurance report was not up to date. No action needed at this time.   Lang Sieve, PharmD, BCGP Clinical Pharmacist  [phone]

## 2023-10-28 ENCOUNTER — Other Ambulatory Visit: Payer: Self-pay | Admitting: Family Medicine

## 2023-10-28 DIAGNOSIS — K219 Gastro-esophageal reflux disease without esophagitis: Secondary | ICD-10-CM

## 2023-10-31 ENCOUNTER — Ambulatory Visit: Payer: Self-pay | Admitting: *Deleted

## 2023-10-31 NOTE — Telephone Encounter (Signed)
 Please advise. Due to additional sx of weakness, feeling exhausted and dry mouth at end of triage, recommended ED for possible dehydration sx. Patient does not want to go and wants to keep appt for tomorrow. Due to nausea and difficulty eating and drinking recommended ED. Please advise.       FYI Only or Action Required?: Action required by provider: request for appointment and if she can keep appt tomorrow..  Patient was last seen in primary care on 05/10/2023 by Amanda Butler DASEN, MD.  Called Nurse Triage reporting Cough.  Symptoms began several weeks ago.  Interventions attempted: Rest, hydration, or home remedies.  Symptoms are: gradually worsening.  Triage Disposition: See Physician Within 24 Hours recommended ED due to patient reports at end of triage and patient does not want to go to ED but keep appt tomorrow.   Patient/caregiver understands and will follow disposition?: No, wishes to speak with PCP                Copied from CRM [id number]. Topic: Clinical - Red Word Triage >> Oct 31, 2023  2:47 PM Amanda Pope wrote: Red Word that prompted transfer to Nurse Triage: Patient states she has had a cold for the last 2 weeks, very nauseated and unable to keep anything down (food/water), green phlegm. Reason for Disposition  [1] Continuous (nonstop) coughing interferes with work or school AND [2] no improvement using cough treatment per Care Advice  Answer Assessment - Initial Assessment Questions Recommended to be evaluated today and recommended ED due to report of dry mouth and weakness standing and reports feeling exhausted. Patient report she is urinating every 8 hours. Worsening sx and hx COPD. No chest pain no difficulty difficulties. Nausea reported and poor po intake. Appt was made prior to patient reporting weakness and dry mouth and shaking hands with picking up cup. Patient does not want to go to ED. Please advise if patient can keep OV tomorrow.    CAL notified      1. ONSET: When did the cough begin?      2 weeks ago  2. SEVERITY: How bad is the cough today?      Productive  3. SPUTUM: Describe the color of your sputum (e.g., none, dry cough; clear, white, yellow, green)     Green  4. HEMOPTYSIS: Are you coughing up any blood? If Yes, ask: How much? (e.g., flecks, streaks, tablespoons, etc.)     na 5. DIFFICULTY BREATHING: Are you having difficulty breathing? If Yes, ask: How bad is it? (e.g., mild, moderate, severe)      No  6. FEVER: Do you have a fever? If Yes, ask: What is your temperature, how was it measured, and when did it start?     Couple of days 99 but no fever now  7. CARDIAC HISTORY: Do you have any history of heart disease? (e.g., heart attack, congestive heart failure)      Na  8. LUNG HISTORY: Do you have any history of lung disease?  (e.g., pulmonary embolus, asthma, emphysema)     Hx COPD  9. PE RISK FACTORS: Do you have a history of blood clots? (or: recent major surgery, recent prolonged travel, bedridden)     na 10. OTHER SYMPTOMS: Do you have any other symptoms? (e.g., runny nose, wheezing, chest pain)       Cough, croupy cough , green sputum, nasal congestion clear no chest pain , no difficulty breathing, no fever. Weakness standing , dry mouth. Hx  DM, poor po intake food or fluids. nausea 11. PREGNANCY: Is there any chance you are pregnant? When was your last menstrual period?       na 12. TRAVEL: Have you traveled out of the country in the last month? (e.g., travel history, exposures)       na  Protocols used: Cough - Acute Productive-A-AH

## 2023-11-01 ENCOUNTER — Ambulatory Visit: Admitting: Family Medicine

## 2023-11-01 ENCOUNTER — Encounter: Payer: Self-pay | Admitting: Family Medicine

## 2023-11-01 VITALS — BP 124/78 | HR 89 | Temp 97.5°F | Ht 65.0 in | Wt 175.0 lb

## 2023-11-01 DIAGNOSIS — J189 Pneumonia, unspecified organism: Secondary | ICD-10-CM | POA: Diagnosis not present

## 2023-11-01 MED ORDER — PROMETHAZINE HCL 25 MG PO TABS
25.0000 mg | ORAL_TABLET | Freq: Three times a day (TID) | ORAL | 0 refills | Status: DC | PRN
Start: 2023-11-01 — End: 2023-12-25

## 2023-11-01 MED ORDER — CEFTRIAXONE SODIUM 1 G IJ SOLR
1.0000 g | Freq: Once | INTRAMUSCULAR | Status: AC
  Administered 2023-11-01: 1 g via INTRAMUSCULAR

## 2023-11-01 MED ORDER — AZITHROMYCIN 250 MG PO TABS: ORAL_TABLET | ORAL | 0 refills | Status: DC

## 2023-11-01 MED ORDER — AMOXICILLIN-POT CLAVULANATE 875-125 MG PO TABS: 1.0000 | ORAL_TABLET | Freq: Two times a day (BID) | ORAL | 0 refills | Status: DC

## 2023-11-01 MED ORDER — HYDROCODONE BIT-HOMATROP MBR 5-1.5 MG/5ML PO SOLN: 5.0000 mL | Freq: Three times a day (TID) | ORAL | 0 refills | Status: DC | PRN

## 2023-11-01 NOTE — Progress Notes (Signed)
 Wt Readings from Last 3 Encounters:  11/01/23 175 lb (79.4 kg)  09/11/23 196 lb (88.9 kg)  05/10/23 196 lb 6 oz (89.1 kg)     Subjective:    Patient ID: Amanda Pope, female    DOB: [birth year], 78 y.o.   MRN: [id number]  Patient appears very ill today.  She states that symptoms started about 2 weeks ago.  Started with viral symptoms.  She was having a sore throat and then a cough that was nonproductive.  However over the last week, the cough has gotten steadily worse.  It remains nonproductive she is now having subjective fevers and rigors.  She has also developed nausea and vomiting.  She is not able to keep down food..  Today she is shaking on encounter.  She appears very weak and dehydrated.  She is alert and oriented x 3.  She is answering questions appropriately.  She is not in respiratory distress but she is coughing frequently.  She is having 1 episode of diarrhea however she states that she has  not been able to eat much over the last several days.  Unfortunately she is also taking Mounjaro  for weight loss and diabetes.  She denies any abdominal pain.  She denies any dysuria.  She denies any sinus pain.  She denies any sore throat.  She does have a dry persistent cough Past Medical History:  Diagnosis Date   Allergy    Arthritis    hip & back   Asthma    Cataract    COPD (chronic obstructive pulmonary disease) (HCC)    Cystocele    Depression    Diabetes mellitus without complication (HCC)    Phreesia 05/27/2020   GERD (gastroesophageal reflux disease)    History of Helicobacter pylori infection    Hypertension    Neuromuscular disorder (HCC)    Shortness of breath dyspnea    Urticaria    Viral URI with cough 10/16/2022   Vitamin D  deficiency    Past Surgical History:  Procedure Laterality Date   ABDOMINAL HYSTERECTOMY     And Removed 1 ovary   APPENDECTOMY     CHOLECYSTECTOMY  05/30/2007   EYE SURGERY     JOINT REPLACEMENT     TONSILLECTOMY     TOTAL HIP ARTHROPLASTY  Right 06/09/2015   TOTAL HIP ARTHROPLASTY Right 06/09/2015   Procedure: RIGHT TOTAL HIP ARTHROPLASTY ANTERIOR APPROACH;  Surgeon: Kay CHRISTELLA Cummins, MD;  Location: [facility] OR;  Service: Orthopedics;  Laterality: Right;   TUBAL LIGATION     Current Outpatient Medications on File Prior to Visit  Medication Sig Dispense Refill   Accu-Chek FastClix Lancets MISC Use to check FBS daily DX: E11.9 100 each 3   albuterol  (VENTOLIN  HFA) 108 (90 Base) MCG/ACT inhaler Inhale 2 puffs into the lungs every 6 (six) hours as needed for wheezing or shortness of breath. 2 each 3   aspirin  EC 325 MG tablet Take 1 tablet (325 mg total) by mouth 2 (two) times daily. 84 tablet 0   blood glucose meter kit and supplies Dispense based on patient and insurance preference. Use up to 1 time daily as directed. (FOR ICD-10 E10.9, E11.9). 1 each 0   Blood Glucose Monitoring Suppl (ONETOUCH VERIO FLEX SYSTEM) w/Device KIT Use to check blood sugars daily 1 kit 0   BREZTRI  AEROSPHERE 160-9-4.8 MCG/ACT AERO inhaler INHALE 2 PUFFS INTO THE LUNGS TWICE A DAY 10.7 each 11   calcium  gluconate 500 MG tablet Take 1 tablet (  500 mg total) by mouth 3 (three) times daily. 30 tablet 11   [employer] D3 2000 units CAPS TAKE 2 CAPSULES BY MOUTH ONCE A DAY 60 capsule 9   estradiol  (ESTRACE ) 0.5 MG tablet Take 1 tablet (0.5 mg total) by mouth daily. 90 tablet 1   fluticasone  (FLONASE ) 50 MCG/ACT nasal spray SPRAY 2 SPRAYS INTO EACH NOSTRIL EVERY DAY 48 mL 2   gabapentin  (NEURONTIN ) 300 MG capsule TAKE 1 CAPSULE BY MOUTH THREE TIMES A DAY 90 capsule 3   Glucose Blood (BLOOD GLUCOSE TEST STRIPS 333) STRP USE TO CHECK FASTING BLOOD SUGAR DAILY. 100 strip 3   hydrochlorothiazide  (HYDRODIURIL ) 25 MG tablet Take 1 tablet (25 mg total) by mouth daily. 90 tablet 1   Lancets Misc. (ACCU-CHEK FASTCLIX LANCET) KIT Use to check FBS daily DX: E11.9 1 kit 1   levocetirizine (XYZAL ) 5 MG tablet TAKE 1 TABLET BY MOUTH EVERY DAY IN THE EVENING 90 tablet 1   losartan  (COZAAR ) 100  MG tablet Take 1 tablet (100 mg total) by mouth daily. 90 tablet 1   omeprazole  (PRILOSEC) 20 MG capsule TAKE 1 CAPSULE BY MOUTH EVERY DAY 90 capsule 1   ondansetron  (ZOFRAN ) 4 MG tablet Take 1 tablet (4 mg total) by mouth every 8 (eight) hours as needed for nausea or vomiting. 20 tablet 0   tirzepatide  (MOUNJARO ) 15 MG/0.5ML Pen Inject 15 mg into the skin once a week. 6 mL 3   triamcinolone  cream (KENALOG ) 0.1 % Apply topically 2 (two) times daily. 30 g 1   No current facility-administered medications on file prior to visit.     Allergies  Allergen Reactions   Prevalite  [Cholestyramine  Light] Hives   Ace Inhibitors Rash   Social History   Socioeconomic History   Marital status: Married    Spouse name: Not on file   Number of children: Not on file   Years of education: Not on file   Highest education level: Not on file  Occupational History   Not on file  Tobacco Use   Smoking status: Former    Current packs/day: 0.00    Average packs/day: 1 pack/day for 45.7 years (45.7 ttl pk-yrs)    Types: Cigarettes    Start date: 01/29/1965    Quit date: 10/30/2010    Years since quitting: 13.0   Smokeless tobacco: Never  Substance and Sexual Activity   Alcohol use: No   Drug use: No   Sexual activity: Yes    Comment: Hystorectomy  Other Topics Concern   Not on file  Social History Narrative   Entered 07/2013:   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   Married.   Social Drivers of Corporate investment banker Strain: Low Risk  (09/11/2023)   Overall Financial Resource Strain (CARDIA)    Difficulty of Paying Living Expenses: Not hard at all  Food Insecurity: No Food Insecurity (09/11/2023)   Hunger Vital Sign    Worried About Running Out of Food in the Last Year: Never true    Ran Out of Food in the Last Year: Never true  Transportation Needs: No Transportation Needs (09/11/2023)   PRAPARE - Administrator, Civil Service (Medical): No    Lack of Transportation  (Non-Medical): No  Physical Activity: Insufficiently Active (09/11/2023)   Exercise Vital Sign    Days of Exercise per Week: 2 days    Minutes of Exercise per Session: 60 min  Stress: No Stress Concern Present (09/11/2023)  [employer] of Occupational Health - Occupational Stress Questionnaire    Feeling of Stress: Not at all  Social Connections: Moderately Integrated (09/11/2023)   Social Connection and Isolation Panel    Frequency of Communication with Friends and Family: More than three times a week    Frequency of Social Gatherings with Friends and Family: Twice a week    Attends Religious Services: 1 to 4 times per year    Active Member of [employer] or Organizations: No    Attends Banker Meetings: Never    Marital Status: Married  Catering manager Violence: Not At Risk (09/11/2023)   Humiliation, Afraid, Rape, and Kick questionnaire    Fear of Current or Ex-Partner: No    Emotionally Abused: No    Physically Abused: No    Sexually Abused: No     Review of Systems  All other systems reviewed and are negative.      Objective:   Physical Exam Constitutional:      General: She is not in acute distress.    Appearance: Normal appearance. She is ill-appearing. She is not toxic-appearing or diaphoretic.  HENT:     Right Ear: Tympanic membrane and ear canal normal.     Left Ear: Tympanic membrane and ear canal normal.     Nose: No congestion or rhinorrhea.     Mouth/Throat:     Mouth: Mucous membranes are dry.     Pharynx: No oropharyngeal exudate or posterior oropharyngeal erythema.  Eyes:     Conjunctiva/sclera: Conjunctivae normal.  Cardiovascular:     Rate and Rhythm: Normal rate and regular rhythm.     Heart sounds: Normal heart sounds. No murmur heard. Pulmonary:     Effort: Pulmonary effort is normal. No respiratory distress.     Breath sounds: Decreased air movement present. No stridor. No wheezing, rhonchi or rales.  Chest:     Chest wall: No  tenderness.  Abdominal:     General: Abdomen is flat. Bowel sounds are normal. There is no distension.     Palpations: Abdomen is soft.     Tenderness: There is no abdominal tenderness. There is no right CVA tenderness or guarding.  Neurological:     Mental Status: She is alert.           Assessment & Plan:  Walking pneumonia - Plan: cefTRIAXone (ROCEPHIN) injection 1 g Although her lungs sound clear, she has decreased air movement.  She clinically appears ill.  I am concerned she is developing a secondary pneumonia.  She also is having fevers and shaking rigors.  Begin Rocephin 1 g IM x 1 now and then cover pneumonia with Augmentin  875 mg twice daily and a Z-Pak.  Recheck next week or seek medical attention immediately if worsening.  Temporarily hold Mounjaro .  Discontinue hydrochlorothiazide .  Patient has already stopped metformin .  Push fluids such as Gatorade.  Use Phenergan 25 mg every 8 hours as needed for nausea.  Can also use Hycodan as needed for cough at night to help her rest.

## 2023-11-01 NOTE — Progress Notes (Signed)
   Acute Office Visit  Subjective:     Patient ID: Amanda Pope, female    DOB: 02-11-1945, 78 y.o.   MRN: [id number]  Chief Complaint  Patient presents with   Cough    congested cough for the last 2 weeks. Pt also c/o fatigue, low grade fever and nausea. Pt states she has had vomiting and diarrhea this morning.     HPI   ROS      Objective:    BP 124/78   Pulse 89   Temp (!) 97.5 F (36.4 C)   Ht 5' 5 (1.651 m)   Wt 175 lb (79.4 kg)   SpO2 95%   BMI 29.12 kg/m    Physical Exam  No results found for any visits on 11/01/23.      Assessment & Plan:   Problem List Items Addressed This Visit   None Visit Diagnoses       Walking pneumonia    -  Primary   Relevant Medications   amoxicillin -clavulanate (AUGMENTIN ) 875-125 MG tablet   azithromycin  (ZITHROMAX ) 250 MG tablet   promethazine (PHENERGAN) 25 MG tablet   HYDROcodone  bit-homatropine (HYCODAN) 5-1.5 MG/5ML syrup   cefTRIAXone (ROCEPHIN) injection 1 g       Meds ordered this encounter  Medications   amoxicillin -clavulanate (AUGMENTIN ) 875-125 MG tablet    Sig: Take 1 tablet by mouth 2 (two) times daily.    Dispense:  20 tablet    Refill:  0   azithromycin  (ZITHROMAX ) 250 MG tablet    Sig: 2 tabs poqday1, 1 tab poqday 2-5    Dispense:  6 tablet    Refill:  0   promethazine (PHENERGAN) 25 MG tablet    Sig: Take 1 tablet (25 mg total) by mouth every 8 (eight) hours as needed for nausea or vomiting.    Dispense:  20 tablet    Refill:  0   HYDROcodone  bit-homatropine (HYCODAN) 5-1.5 MG/5ML syrup    Sig: Take 5 mLs by mouth every 8 (eight) hours as needed for cough.    Dispense:  120 mL    Refill:  0   cefTRIAXone (ROCEPHIN) injection 1 g    No follow-ups on file.  Ronal Slater MARLA Angelena, LPN

## 2023-11-15 ENCOUNTER — Ambulatory Visit (INDEPENDENT_AMBULATORY_CARE_PROVIDER_SITE_OTHER): Admitting: Family Medicine

## 2023-11-15 ENCOUNTER — Encounter: Payer: Self-pay | Admitting: Family Medicine

## 2023-11-15 VITALS — BP 127/74 | HR 65 | Temp 98.3°F | Ht 65.0 in | Wt 174.0 lb

## 2023-11-15 DIAGNOSIS — R490 Dysphonia: Secondary | ICD-10-CM | POA: Diagnosis not present

## 2023-11-15 DIAGNOSIS — M7582 Other shoulder lesions, left shoulder: Secondary | ICD-10-CM | POA: Diagnosis not present

## 2023-11-15 DIAGNOSIS — Z7985 Long-term (current) use of injectable non-insulin antidiabetic drugs: Secondary | ICD-10-CM | POA: Diagnosis not present

## 2023-11-15 DIAGNOSIS — E118 Type 2 diabetes mellitus with unspecified complications: Secondary | ICD-10-CM | POA: Diagnosis not present

## 2023-11-15 MED ORDER — TIRZEPATIDE 12.5 MG/0.5ML ~~LOC~~ SOAJ: 12.5000 mg | SUBCUTANEOUS | 3 refills | Status: AC

## 2023-11-15 MED ORDER — MELOXICAM 7.5 MG PO TABS: 7.5000 mg | ORAL_TABLET | Freq: Every day | ORAL | 0 refills | Status: DC

## 2023-11-15 MED ORDER — OMEPRAZOLE 40 MG PO CPDR: 40.0000 mg | DELAYED_RELEASE_CAPSULE | Freq: Every day | ORAL | 3 refills | Status: DC

## 2023-11-15 NOTE — Progress Notes (Signed)
 Wt Readings from Last 3 Encounters:  11/15/23 174 lb (78.9 kg)  11/01/23 175 lb (79.4 kg)  09/11/23 196 lb (88.9 kg)     Subjective:    Patient ID: Amanda Pope, female    DOB: 09-08-1945, 78 y.o.   MRN: [id number]  Patient has lost weight on Mounjaro .  She is on the maximum 15 mg weekly.  She states that her blood sugars are going excellent.  She is here today for follow-up of her diabetes.  Her blood pressure today is outstanding at 127/74.  She is due for fasting lab work.  However she has 2 concerns.  First she has been experiencing hoarseness for the last 6 months.  She states that her voice will come and go almost on a daily basis over the last 6 months.  She does deal with almost daily reflux.  This is worsening since she has been on Mounjaro .  She denies any cough.  She denies any hemoptysis.  She denies any fevers or chills.  She is experiencing some nausea on the high dose of Mounjaro .  She denies any odynophagia or dysphagia.  She does have a history of smoking but she quit more than 15 years ago.  Second she reports pain in her left shoulder.  She has pain with internal rotation.  She has pain with abduction greater than 90 degrees.  She has pain with Hawking's maneuver.  She has pain with empty can testing.  There is no pain with passive range of motion in her shoulder.  Her strength is relatively good. Past Medical History:  Diagnosis Date   Allergy    Arthritis    hip & back   Asthma    Cataract    COPD (chronic obstructive pulmonary disease) (HCC)    Cystocele    Depression    Diabetes mellitus without complication (HCC)    Phreesia 05/27/2020   GERD (gastroesophageal reflux disease)    History of Helicobacter pylori infection    Hypertension    Neuromuscular disorder (HCC)    Shortness of breath dyspnea    Urticaria    Viral URI with cough 10/16/2022   Vitamin D  deficiency    Past Surgical History:  Procedure Laterality Date   ABDOMINAL HYSTERECTOMY     And Removed  1 ovary   APPENDECTOMY     CHOLECYSTECTOMY  05/30/2007   EYE SURGERY     JOINT REPLACEMENT     TONSILLECTOMY     TOTAL HIP ARTHROPLASTY Right 06/09/2015   TOTAL HIP ARTHROPLASTY Right 06/09/2015   Procedure: RIGHT TOTAL HIP ARTHROPLASTY ANTERIOR APPROACH;  Surgeon: Kay CHRISTELLA Cummins, MD;  Location: [facility] OR;  Service: Orthopedics;  Laterality: Right;   TUBAL LIGATION     Current Outpatient Medications on File Prior to Visit  Medication Sig Dispense Refill   Accu-Chek FastClix Lancets MISC Use to check FBS daily DX: E11.9 100 each 3   albuterol  (VENTOLIN  HFA) 108 (90 Base) MCG/ACT inhaler Inhale 2 puffs into the lungs every 6 (six) hours as needed for wheezing or shortness of breath. 2 each 3   amoxicillin -clavulanate (AUGMENTIN ) 875-125 MG tablet Take 1 tablet by mouth 2 (two) times daily. 20 tablet 0   aspirin  EC 325 MG tablet Take 1 tablet (325 mg total) by mouth 2 (two) times daily. 84 tablet 0   azithromycin  (ZITHROMAX ) 250 MG tablet 2 tabs poqday1, 1 tab poqday 2-5 6 tablet 0   blood glucose meter kit and supplies Dispense based on  patient and insurance preference. Use up to 1 time daily as directed. (FOR ICD-10 E10.9, E11.9). 1 each 0   Blood Glucose Monitoring Suppl (ONETOUCH VERIO FLEX SYSTEM) w/Device KIT Use to check blood sugars daily 1 kit 0   BREZTRI  AEROSPHERE 160-9-4.8 MCG/ACT AERO inhaler INHALE 2 PUFFS INTO THE LUNGS TWICE A DAY 10.7 each 11   calcium  gluconate 500 MG tablet Take 1 tablet (500 mg total) by mouth 3 (three) times daily. 30 tablet 11   [employer] D3 2000 units CAPS TAKE 2 CAPSULES BY MOUTH ONCE A DAY 60 capsule 9   estradiol  (ESTRACE ) 0.5 MG tablet Take 1 tablet (0.5 mg total) by mouth daily. 90 tablet 1   fluticasone  (FLONASE ) 50 MCG/ACT nasal spray SPRAY 2 SPRAYS INTO EACH NOSTRIL EVERY DAY 48 mL 2   gabapentin  (NEURONTIN ) 300 MG capsule TAKE 1 CAPSULE BY MOUTH THREE TIMES A DAY 90 capsule 3   Glucose Blood (BLOOD GLUCOSE TEST STRIPS 333) STRP USE TO CHECK FASTING BLOOD  SUGAR DAILY. 100 strip 3   hydrochlorothiazide  (HYDRODIURIL ) 25 MG tablet Take 1 tablet (25 mg total) by mouth daily. 90 tablet 1   HYDROcodone  bit-homatropine (HYCODAN) 5-1.5 MG/5ML syrup Take 5 mLs by mouth every 8 (eight) hours as needed for cough. 120 mL 0   Lancets Misc. (ACCU-CHEK FASTCLIX LANCET) KIT Use to check FBS daily DX: E11.9 1 kit 1   levocetirizine (XYZAL ) 5 MG tablet TAKE 1 TABLET BY MOUTH EVERY DAY IN THE EVENING 90 tablet 1   losartan  (COZAAR ) 100 MG tablet Take 1 tablet (100 mg total) by mouth daily. 90 tablet 1   ondansetron  (ZOFRAN ) 4 MG tablet Take 1 tablet (4 mg total) by mouth every 8 (eight) hours as needed for nausea or vomiting. 20 tablet 0   promethazine (PHENERGAN) 25 MG tablet Take 1 tablet (25 mg total) by mouth every 8 (eight) hours as needed for nausea or vomiting. 20 tablet 0   triamcinolone  cream (KENALOG ) 0.1 % Apply topically 2 (two) times daily. 30 g 1   No current facility-administered medications on file prior to visit.     Allergies  Allergen Reactions   Prevalite  [Cholestyramine  Light] Hives   Ace Inhibitors Rash   Social History   Socioeconomic History   Marital status: Married    Spouse name: Not on file   Number of children: Not on file   Years of education: Not on file   Highest education level: Not on file  Occupational History   Not on file  Tobacco Use   Smoking status: Former    Current packs/day: 0.00    Average packs/day: 1 pack/day for 45.7 years (45.7 ttl pk-yrs)    Types: Cigarettes    Start date: 01/29/1965    Quit date: 10/30/2010    Years since quitting: 13.0   Smokeless tobacco: Never  Substance and Sexual Activity   Alcohol use: No   Drug use: No   Sexual activity: Yes    Comment: Hystorectomy  Other Topics Concern   Not on file  Social History Narrative   Entered 07/2013:   Is retired. 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   Married.   Social Drivers of Corporate investment banker Strain: Low Risk  (09/11/2023)    Overall Financial Resource Strain (CARDIA)    Difficulty of Paying Living Expenses: Not hard at all  Food Insecurity: No Food Insecurity (09/11/2023)   Hunger Vital Sign    Worried About Running Out of Food  in the Last Year: Never true    Ran Out of Food in the Last Year: Never true  Transportation Needs: No Transportation Needs (09/11/2023)   PRAPARE - Administrator, Civil Service (Medical): No    Lack of Transportation (Non-Medical): No  Physical Activity: Insufficiently Active (09/11/2023)   Exercise Vital Sign    Days of Exercise per Week: 2 days    Minutes of Exercise per Session: 60 min  Stress: No Stress Concern Present (09/11/2023)   [employer] of Occupational Health - Occupational Stress Questionnaire    Feeling of Stress: Not at all  Social Connections: Moderately Integrated (09/11/2023)   Social Connection and Isolation Panel    Frequency of Communication with Friends and Family: More than three times a week    Frequency of Social Gatherings with Friends and Family: Twice a week    Attends Religious Services: 1 to 4 times per year    Active Member of [employer] or Organizations: No    Attends Banker Meetings: Never    Marital Status: Married  Catering manager Violence: Not At Risk (09/11/2023)   Humiliation, Afraid, Rape, and Kick questionnaire    Fear of Current or Ex-Partner: No    Emotionally Abused: No    Physically Abused: No    Sexually Abused: No     Review of Systems  All other systems reviewed and are negative.      Objective:   Physical Exam Constitutional:      General: She is not in acute distress.    Appearance: Normal appearance. She is not ill-appearing, toxic-appearing or diaphoretic.  HENT:     Right Ear: Tympanic membrane and ear canal normal.     Left Ear: Tympanic membrane and ear canal normal.     Nose: No congestion or rhinorrhea.     Mouth/Throat:     Mouth: Mucous membranes are dry.     Pharynx: No  oropharyngeal exudate or posterior oropharyngeal erythema.  Eyes:     Conjunctiva/sclera: Conjunctivae normal.  Cardiovascular:     Rate and Rhythm: Normal rate and regular rhythm.     Heart sounds: Normal heart sounds. No murmur heard. Pulmonary:     Effort: Pulmonary effort is normal. No respiratory distress.     Breath sounds: Decreased air movement present. No stridor. No wheezing, rhonchi or rales.  Chest:     Chest wall: No tenderness.  Abdominal:     General: Abdomen is flat. Bowel sounds are normal. There is no distension.     Palpations: Abdomen is soft.     Tenderness: There is no abdominal tenderness. There is no right CVA tenderness or guarding.  Musculoskeletal:     Left shoulder: Tenderness present. No bony tenderness or crepitus. Decreased range of motion. Normal strength.  Neurological:     Mental Status: She is alert.           Assessment & Plan:  Diabetes mellitus type 2 with complications (HCC) - Plan: CBC with Differential/Platelet, Comprehensive metabolic panel with GFR, Lipid panel, Hemoglobin A1c, Microalbumin/Creatinine Ratio, Urine  Rotator cuff tendonitis, left - Plan: Ambulatory referral to Physical Therapy  Chronic hoarseness First, the patient has tendinitis in her rotator cuff.  We discussed a cortisone injection but she declined that today.  Instead she agrees to physical therapy.  We will also use meloxicam 7.5 mg daily as needed for pain but I recommended that she use that medication as sparingly as possible because of her  elevated creatinine.  If her creatinine is worse on her labs today, I will discontinue the meloxicam.  Second she has chronic intermittent hoarseness.  I feel that this is likely multifactorial and related to acid reflux.  I recommended increasing omeprazole  to 40 mg a day.  I recommended raising the head of her bed 2 inches to avoid/reduce reflux at night.  Also recommended decreasing Mounjaro  to 12.5 mg weekly given her worsening  nausea.  Patient would like to see an ENT physician for laryngoscopy because her family is concerned.  Lastly I will check a CBC a CMP lipid panel and an A1c.  Goal A1c is less than 6.5, goal LDL cholesterol is less than 100.

## 2023-11-16 LAB — COMPREHENSIVE METABOLIC PANEL WITH GFR
AG Ratio: 1.6 (calc) (ref 1.0–2.5)
ALT: 10 U/L (ref 6–29)
AST: 13 U/L (ref 10–35)
Albumin: 4.4 g/dL (ref 3.6–5.1)
Alkaline phosphatase (APISO): 68 U/L (ref 37–153)
BUN/Creatinine Ratio: 20 (calc) (ref 6–22)
BUN: 30 mg/dL — ABNORMAL HIGH (ref 7–25)
CO2: 30 mmol/L (ref 20–32)
Calcium: 10 mg/dL (ref 8.6–10.4)
Chloride: 100 mmol/L (ref 98–110)
Creat: 1.53 mg/dL — ABNORMAL HIGH (ref 0.60–1.00)
Globulin: 2.8 g/dL (ref 1.9–3.7)
Glucose, Bld: 86 mg/dL (ref 65–99)
Potassium: 3.8 mmol/L (ref 3.5–5.3)
Sodium: 141 mmol/L (ref 135–146)
Total Bilirubin: 0.9 mg/dL (ref 0.2–1.2)
Total Protein: 7.2 g/dL (ref 6.1–8.1)
eGFR: 35 mL/min/1.73m2 — ABNORMAL LOW (ref >=60)

## 2023-11-16 LAB — CBC WITH DIFFERENTIAL/PLATELET
Absolute Lymphocytes: 3831 {cells}/uL (ref 850–3900)
Absolute Monocytes: 813 {cells}/uL (ref 200–950)
Basophils Absolute: 96 {cells}/uL (ref 0–200)
Basophils Relative: 0.9 %
Eosinophils Absolute: 535 {cells}/uL — ABNORMAL HIGH (ref 15–500)
Eosinophils Relative: 5 %
HCT: 39.9 % (ref 35.0–45.0)
Hemoglobin: 12.7 g/dL (ref 11.7–15.5)
MCH: 27.5 pg (ref 27.0–33.0)
MCHC: 31.8 g/dL — ABNORMAL LOW (ref 32.0–36.0)
MCV: 86.4 fL (ref 80.0–100.0)
MPV: 10.5 fL (ref 7.5–12.5)
Monocytes Relative: 7.6 %
Neutro Abs: 5425 {cells}/uL (ref 1500–7800)
Neutrophils Relative %: 50.7 %
Platelets: 326 Thousand/uL (ref 140–400)
RBC: 4.62 Million/uL (ref 3.80–5.10)
RDW: 13.7 % (ref 11.0–15.0)
Total Lymphocyte: 35.8 %
WBC: 10.7 Thousand/uL (ref 3.8–10.8)

## 2023-11-16 LAB — MICROALBUMIN / CREATININE URINE RATIO
Creatinine, Urine: 253 mg/dL (ref 20–275)
Microalb Creat Ratio: 3 mg/g{creat} (ref <30)
Microalb, Ur: 0.8 mg/dL

## 2023-11-16 LAB — LIPID PANEL
Cholesterol: 151 mg/dL (ref <200)
HDL: 44 mg/dL — ABNORMAL LOW (ref >=50)
LDL Cholesterol (Calc): 79 mg/dL
Non-HDL Cholesterol (Calc): 107 mg/dL (ref <130)
Total CHOL/HDL Ratio: 3.4 (calc) (ref <5.0)
Triglycerides: 189 mg/dL — ABNORMAL HIGH (ref <150)

## 2023-11-16 LAB — HEMOGLOBIN A1C
Hgb A1c MFr Bld: 5.4 % (ref <5.7)
Mean Plasma Glucose: 108 mg/dL
eAG (mmol/L): 6 mmol/L

## 2023-11-18 ENCOUNTER — Ambulatory Visit: Payer: Self-pay | Admitting: Family Medicine

## 2023-11-22 ENCOUNTER — Other Ambulatory Visit

## 2023-11-25 ENCOUNTER — Other Ambulatory Visit

## 2023-11-25 DIAGNOSIS — E118 Type 2 diabetes mellitus with unspecified complications: Secondary | ICD-10-CM | POA: Diagnosis not present

## 2023-11-25 DIAGNOSIS — N289 Disorder of kidney and ureter, unspecified: Secondary | ICD-10-CM

## 2023-11-25 DIAGNOSIS — I10 Essential (primary) hypertension: Secondary | ICD-10-CM

## 2023-11-25 LAB — RENAL FUNCTION PANEL
Albumin: 3.9 g/dL (ref 3.6–5.1)
BUN/Creatinine Ratio: 16 (calc) (ref 6–22)
BUN: 18 mg/dL (ref 7–25)
CO2: 28 mmol/L (ref 20–32)
Calcium: 9.5 mg/dL (ref 8.6–10.4)
Chloride: 107 mmol/L (ref 98–110)
Creat: 1.14 mg/dL — ABNORMAL HIGH (ref 0.60–1.00)
Glucose, Bld: 100 mg/dL — ABNORMAL HIGH (ref 65–99)
Phosphorus: 2.8 mg/dL (ref 2.1–4.3)
Potassium: 4.1 mmol/L (ref 3.5–5.3)
Sodium: 143 mmol/L (ref 135–146)

## 2023-11-26 ENCOUNTER — Ambulatory Visit: Payer: Self-pay | Admitting: Family Medicine

## 2023-11-28 ENCOUNTER — Encounter: Payer: Self-pay | Admitting: *Deleted

## 2023-11-28 ENCOUNTER — Encounter: Payer: Self-pay | Admitting: Family Medicine

## 2023-11-28 ENCOUNTER — Ambulatory Visit (INDEPENDENT_AMBULATORY_CARE_PROVIDER_SITE_OTHER): Admitting: Family Medicine

## 2023-11-28 VITALS — BP 120/62 | HR 75 | Temp 97.6°F | Ht 65.0 in | Wt 173.0 lb

## 2023-11-28 DIAGNOSIS — G4700 Insomnia, unspecified: Secondary | ICD-10-CM

## 2023-11-28 MED ORDER — ALPRAZOLAM 0.5 MG PO TABS: 0.5000 mg | ORAL_TABLET | Freq: Every evening | ORAL | 0 refills | Status: AC | PRN

## 2023-11-28 NOTE — Progress Notes (Signed)
 Subjective:    Patient ID: Amanda Pope, female    DOB: May 24, 1945, 78 y.o.   MRN: [id number]  Patient states she wants to discontinue Mounjaro .  She states she feels terrible on the medication.  She believes it is causing her to have trouble sleeping.  She also feels weak and tired.  She feels nauseated.  Her hemoglobin A1c was 5.4 however she also appeared to be clinically dehydrated on her lab work in October.  Her creatinine improved after she push fluids.  She would like to stop the medication altogether.  She states that 2-3 nights a week, she is unable to get to sleep.  If she does not fall asleep by 1130, she is up the entire night.  She has tried Benadryl  over-the-counter with no success.  She denies any depression or anxiety Past Medical History:  Diagnosis Date   Allergy    Arthritis    hip & back   Asthma    Cataract    COPD (chronic obstructive pulmonary disease) (HCC)    Cystocele    Depression    Diabetes mellitus without complication (HCC)    Phreesia 05/27/2020   GERD (gastroesophageal reflux disease)    History of Helicobacter pylori infection    Hypertension    Neuromuscular disorder (HCC)    Shortness of breath dyspnea    Urticaria    Viral URI with cough 10/16/2022   Vitamin D  deficiency    Past Surgical History:  Procedure Laterality Date   ABDOMINAL HYSTERECTOMY     And Removed 1 ovary   APPENDECTOMY     CHOLECYSTECTOMY  05/30/2007   EYE SURGERY     JOINT REPLACEMENT     TONSILLECTOMY     TOTAL HIP ARTHROPLASTY Right 06/09/2015   TOTAL HIP ARTHROPLASTY Right 06/09/2015   Procedure: RIGHT TOTAL HIP ARTHROPLASTY ANTERIOR APPROACH;  Surgeon: Kay CHRISTELLA Cummins, MD;  Location: [facility] OR;  Service: Orthopedics;  Laterality: Right;   TUBAL LIGATION     Current Outpatient Medications on File Prior to Visit  Medication Sig Dispense Refill   Accu-Chek FastClix Lancets MISC Use to check FBS daily DX: E11.9 100 each 3   albuterol  (VENTOLIN  HFA) 108 (90 Base) MCG/ACT  inhaler Inhale 2 puffs into the lungs every 6 (six) hours as needed for wheezing or shortness of breath. 2 each 3   blood glucose meter kit and supplies Dispense based on patient and insurance preference. Use up to 1 time daily as directed. (FOR ICD-10 E10.9, E11.9). 1 each 0   Blood Glucose Monitoring Suppl (ONETOUCH VERIO FLEX SYSTEM) w/Device KIT Use to check blood sugars daily 1 kit 0   BREZTRI  AEROSPHERE 160-9-4.8 MCG/ACT AERO inhaler INHALE 2 PUFFS INTO THE LUNGS TWICE A DAY 10.7 each 11   calcium  gluconate 500 MG tablet Take 1 tablet (500 mg total) by mouth 3 (three) times daily. 30 tablet 11   [employer]S D3 2000 units CAPS TAKE 2 CAPSULES BY MOUTH ONCE A DAY 60 capsule 9   estradiol  (ESTRACE ) 0.5 MG tablet Take 1 tablet (0.5 mg total) by mouth daily. 90 tablet 1   fluticasone  (FLONASE ) 50 MCG/ACT nasal spray SPRAY 2 SPRAYS INTO EACH NOSTRIL EVERY DAY 48 mL 2   gabapentin  (NEURONTIN ) 300 MG capsule TAKE 1 CAPSULE BY MOUTH THREE TIMES A DAY 90 capsule 3   Glucose Blood (BLOOD GLUCOSE TEST STRIPS 333) STRP USE TO CHECK FASTING BLOOD SUGAR DAILY. 100 strip 3   hydrochlorothiazide  (HYDRODIURIL ) 25 MG  tablet Take 1 tablet (25 mg total) by mouth daily. 90 tablet 1   HYDROcodone  bit-homatropine (HYCODAN) 5-1.5 MG/5ML syrup Take 5 mLs by mouth every 8 (eight) hours as needed for cough. 120 mL 0   Lancets Misc. (ACCU-CHEK FASTCLIX LANCET) KIT Use to check FBS daily DX: E11.9 1 kit 1   levocetirizine (XYZAL ) 5 MG tablet TAKE 1 TABLET BY MOUTH EVERY DAY IN THE EVENING 90 tablet 1   losartan  (COZAAR ) 100 MG tablet Take 1 tablet (100 mg total) by mouth daily. 90 tablet 1   meloxicam (MOBIC) 7.5 MG tablet Take 1 tablet (7.5 mg total) by mouth daily. 30 tablet 0   omeprazole  (PRILOSEC) 40 MG capsule Take 1 capsule (40 mg total) by mouth daily. 30 capsule 3   ondansetron  (ZOFRAN ) 4 MG tablet Take 1 tablet (4 mg total) by mouth every 8 (eight) hours as needed for nausea or vomiting. 20 tablet 0   promethazine  (PHENERGAN) 25 MG tablet Take 1 tablet (25 mg total) by mouth every 8 (eight) hours as needed for nausea or vomiting. 20 tablet 0   tirzepatide  (MOUNJARO ) 12.5 MG/0.5ML Pen Inject 12.5 mg into the skin once a week. 6 mL 3   triamcinolone  cream (KENALOG ) 0.1 % Apply topically 2 (two) times daily. 30 g 1   No current facility-administered medications on file prior to visit.     Allergies  Allergen Reactions   Prevalite  [Cholestyramine  Light] Hives   Ace Inhibitors Rash   Social History   Socioeconomic History   Marital status: Married    Spouse name: Not on file   Number of children: Not on file   Years of education: Not on file   Highest education level: Not on file  Occupational History   Not on file  Tobacco Use   Smoking status: Former    Current packs/day: 0.00    Average packs/day: 1 pack/day for 45.7 years (45.7 ttl pk-yrs)    Types: Cigarettes    Start date: 01/29/1965    Quit date: 10/30/2010    Years since quitting: 13.0   Smokeless tobacco: Never  Substance and Sexual Activity   Alcohol use: No   Drug use: No   Sexual activity: Yes    Comment: Hystorectomy  Other Topics Concern   Not on file  Social History Narrative   Entered 07/2013:   Is retired. 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   Married.   Social Drivers of Corporate Investment Banker Strain: Low Risk  (09/11/2023)   Overall Financial Resource Strain (CARDIA)    Difficulty of Paying Living Expenses: Not hard at all  Food Insecurity: No Food Insecurity (09/11/2023)   Hunger Vital Sign    Worried About Running Out of Food in the Last Year: Never true    Ran Out of Food in the Last Year: Never true  Transportation Needs: No Transportation Needs (09/11/2023)   PRAPARE - Administrator, Civil Service (Medical): No    Lack of Transportation (Non-Medical): No  Physical Activity: Insufficiently Active (09/11/2023)   Exercise Vital Sign    Days of Exercise per Week: 2 days    Minutes of Exercise  per Session: 60 min  Stress: No Stress Concern Present (09/11/2023)   [employer] of Occupational Health - Occupational Stress Questionnaire    Feeling of Stress: Not at all  Social Connections: Moderately Integrated (09/11/2023)   Social Connection and Isolation Panel    Frequency of Communication with Friends  and Family: More than three times a week    Frequency of Social Gatherings with Friends and Family: Twice a week    Attends Religious Services: 1 to 4 times per year    Active Member of [employer] or Organizations: No    Attends Banker Meetings: Never    Marital Status: Married  Catering Manager Violence: Not At Risk (09/11/2023)   Humiliation, Afraid, Rape, and Kick questionnaire    Fear of Current or Ex-Partner: No    Emotionally Abused: No    Physically Abused: No    Sexually Abused: No     Review of Systems  All other systems reviewed and are negative.      Objective:   Physical Exam Constitutional:      General: She is not in acute distress.    Appearance: Normal appearance. She is not ill-appearing, toxic-appearing or diaphoretic.  HENT:     Nose: No congestion or rhinorrhea.     Mouth/Throat:     Mouth: Mucous membranes are dry.     Pharynx: No oropharyngeal exudate or posterior oropharyngeal erythema.  Eyes:     Conjunctiva/sclera: Conjunctivae normal.  Cardiovascular:     Rate and Rhythm: Normal rate and regular rhythm.     Heart sounds: Normal heart sounds. No murmur heard. Pulmonary:     Effort: Pulmonary effort is normal. No respiratory distress.     Breath sounds: No stridor or decreased air movement. No wheezing, rhonchi or rales.  Chest:     Chest wall: No tenderness.  Abdominal:     General: Abdomen is flat. Bowel sounds are normal. There is no distension.     Palpations: Abdomen is soft.     Tenderness: There is no abdominal tenderness. There is no right CVA tenderness or guarding.  Neurological:     Mental Status: She is alert.            Assessment & Plan:   Insomnia, unspecified type We have decided to temporarily stop Mounjaro  to see if her symptoms improve.  If they do, the patient can decide if she wants to restart the medicine albeit at a lower dose like 5 mg a week.  Meanwhile use Xanax 0.5 mg p.o. nightly as needed insomnia.  We discussed the risk of habituation as well as confusion and falls on the medication

## 2023-11-28 NOTE — Progress Notes (Signed)
 Amanda Pope                                          MRN: [id number]   11/28/2023   The [facility] Quality Team Specialist reviewed this patient medical record for the purposes of chart review for care gap closure. The following were reviewed: abstraction for care gap closure-kidney health evaluation for diabetes:eGFR  and uACR.    [facility] Quality Team

## 2023-12-13 ENCOUNTER — Other Ambulatory Visit: Payer: Self-pay | Admitting: Family Medicine

## 2023-12-18 ENCOUNTER — Encounter (INDEPENDENT_AMBULATORY_CARE_PROVIDER_SITE_OTHER): Payer: Self-pay | Admitting: Otolaryngology

## 2023-12-18 ENCOUNTER — Ambulatory Visit (INDEPENDENT_AMBULATORY_CARE_PROVIDER_SITE_OTHER): Admitting: Otolaryngology

## 2023-12-18 VITALS — BP 147/83 | HR 72 | Temp 97.5°F | Ht 64.0 in | Wt 170.0 lb

## 2023-12-18 DIAGNOSIS — R1310 Dysphagia, unspecified: Secondary | ICD-10-CM

## 2023-12-18 DIAGNOSIS — K219 Gastro-esophageal reflux disease without esophagitis: Secondary | ICD-10-CM

## 2023-12-18 DIAGNOSIS — R1312 Dysphagia, oropharyngeal phase: Secondary | ICD-10-CM

## 2023-12-18 MED ORDER — PANTOPRAZOLE SODIUM 40 MG PO TBEC: 40.0000 mg | DELAYED_RELEASE_TABLET | Freq: Two times a day (BID) | ORAL | 1 refills | Status: AC

## 2023-12-18 NOTE — Progress Notes (Signed)
 Reason for Consult: Hoarseness Referring Physician: Dr. Duanne Rudell Amanda Pope is an 78 y.o. female.  HPI: History of hoarseness for about 4 to 5 months.  It does seem to fluctuate.  She has not had this degree of hoarseness previously.  It does not seem to be associated with any pain except when she has talked for a long time for instance on the telephone and it begins to give her some tightness feeling.  She has a little bit of sticking of food in the midesophagus at times.  Sometimes she regurgitates.  She has not seen a GI doctor.  She has a many year history of reflux and takes a reflux medication.  It was just recently increased to 40 mg once a day.  She has mucus feeling in the throat, throat clearing, and occasional dry cough.  Past Medical History:  Diagnosis Date   Allergy    Arthritis    hip & back   Asthma    Cataract    COPD (chronic obstructive pulmonary disease) (HCC)    Cystocele    Depression    Diabetes mellitus without complication (HCC)    Phreesia 05/27/2020   GERD (gastroesophageal reflux disease)    History of Helicobacter pylori infection    Hypertension    Neuromuscular disorder (HCC)    Shortness of breath dyspnea    Urticaria    Viral URI with cough 10/16/2022   Vitamin D  deficiency     Past Surgical History:  Procedure Laterality Date   ABDOMINAL HYSTERECTOMY     And Removed 1 ovary   APPENDECTOMY     CHOLECYSTECTOMY  05/30/2007   EYE SURGERY     JOINT REPLACEMENT     TONSILLECTOMY     TOTAL HIP ARTHROPLASTY Right 06/09/2015   TOTAL HIP ARTHROPLASTY Right 06/09/2015   Procedure: RIGHT TOTAL HIP ARTHROPLASTY ANTERIOR APPROACH;  Surgeon: Kay CHRISTELLA Cummins, MD;  Location: [facility] OR;  Service: Orthopedics;  Laterality: Right;   TUBAL LIGATION      Family History  Problem Relation Age of Onset   Cancer Mother        colon   Cancer Sister        ovarian   Diabetes Sister    Cancer Brother        melanoma   Cancer Brother 40       colon cancer    Breast cancer Neg Hx     Social History:  reports that she quit smoking about 13 years ago. Her smoking use included cigarettes. She started smoking about 58 years ago. She has a 45.7 pack-year smoking history. She has never used smokeless tobacco. She reports that she does not drink alcohol and does not use drugs.  Allergies:  Allergies  Allergen Reactions   Prevalite  [Cholestyramine  Light] Hives   Ace Inhibitors Rash    Medications: I have reviewed the patient's current medications.  No results found for this or any previous visit (from the past 48 hours).  No results found.  ROS Blood pressure (!) 147/83, pulse 72, temperature (!) 97.5 F (36.4 C), height 5' 4 (1.626 m), weight 170 lb (77.1 kg), SpO2 98%. Physical Exam Constitutional:      Appearance: Normal appearance.  HENT:     Head: Normocephalic and atraumatic.     Right Ear: Tympanic membrane is without lesions and middle ear aerated, ear canal and external ear normal.     Left Ear: Tympanic membrane is without lesions and middle  ear aerated, ear canal and external ear normal.     Nose: Nose normal. Turbinates with mild hypertrophy, No significant swelling or masses.     Oral cavity/oropharynx: Mucous membranes are moist. No lesions or masses    Larynx: She does have a hoarse sounding voice . Mirror attempted without success    Eyes:     Extraocular Movements: Extraocular movements intact.     Conjunctiva/sclera: Conjunctivae normal.     Pupils: Pupils are equal, round, and reactive to light.  Cardiovascular:     Rate and Rhythm: Normal rate.  Pulmonary:     Effort: Pulmonary effort is normal.  Musculoskeletal:     Cervical back: Normal range of motion and neck supple. No rigidity.  Lymphadenopathy:     Cervical: No cervical adenopathy or masses.salivary glands without lesions. .  Neurological:     Mental Status: He is alert. CN 2-12 intact. No nystagmus      Assessment/Plan: Laryngal pharyngeal  reflux/dysphagia-she probably will need a GI evaluation for the sticking of food in the midesophagus.  We talked about a fiberoptic exam but for today she preferred to just change the medication and see how she does over 1 month.  I placed her on Protonix  twice daily.  She will follow-up in 1 month and we will discuss further workup and probably perform the fiberoptic if she still hoarse.  [name] 12/18/2023, 11:15 AM

## 2023-12-22 ENCOUNTER — Other Ambulatory Visit: Payer: Self-pay | Admitting: Family Medicine

## 2023-12-23 NOTE — Telephone Encounter (Signed)
 Requested medications are due for refill today.  yes  Requested medications are on the active medications list.  yes  Last refill. 11/01/2023 #20 0 rf  Future visit scheduled.   Next year  Notes to clinic.  Refill not delegated    Requested Prescriptions  Pending Prescriptions Disp Refills   promethazine  (PHENERGAN ) 25 MG tablet [Pharmacy Med Name: PROMETHAZINE  25 MG TABLET] 20 tablet 0    Sig: TAKE 1 TABLET BY MOUTH EVERY 8 HOURS AS NEEDED FOR NAUSEA OR VOMITING.     Not Delegated - Gastroenterology: Antiemetics Failed - 12/23/2023  5:45 PM      Failed - This refill cannot be delegated      Passed - Valid encounter within last 6 months    Recent Outpatient Visits           3 weeks ago Insomnia, unspecified type   [place] [facility] Medicine Duanne Butler DASEN, MD   1 month ago Diabetes mellitus type 2 with complications [facility])   [place] [facility] Pickard, Butler DASEN, MD   1 month ago Walking pneumonia   [place] [facility] Medicine Duanne Butler DASEN, MD   7 months ago Diabetes mellitus type 2 with complications [facility])   [place] [facility] Medicine Pickard, Butler DASEN, MD   10 months ago Encounter for screening mammogram for malignant neoplasm of breast   [place] [facility] Medicine Duanne Butler DASEN, MD              Signed Prescriptions Disp Refills   meloxicam  (MOBIC ) 7.5 MG tablet 30 tablet 0    Sig: TAKE 1 TABLET BY MOUTH EVERY DAY     Analgesics:  COX2 Inhibitors Failed - 12/23/2023  5:45 PM      Failed - Manual Review: Labs are only required if the patient has taken medication for more than 8 weeks.      Failed - Cr in normal range and within 360 days    Creat  Date Value Ref Range Status  11/25/2023 1.14 (H) 0.60 - 1.00 mg/dL Final   Creatinine, Urine  Date Value Ref Range Status  11/15/2023 253 20 - 275 mg/dL Final         Passed - HGB in normal range and within 360 days     Hemoglobin  Date Value Ref Range Status  11/15/2023 12.7 11.7 - 15.5 g/dL Final         Passed - HCT in normal range and within 360 days    HCT  Date Value Ref Range Status  11/15/2023 39.9 35.0 - 45.0 % Final         Passed - AST in normal range and within 360 days    AST  Date Value Ref Range Status  11/15/2023 13 10 - 35 U/L Final         Passed - ALT in normal range and within 360 days    ALT  Date Value Ref Range Status  11/15/2023 10 6 - 29 U/L Final         Passed - eGFR is 30 or above and within 360 days    GFR, Est African American  Date Value Ref Range Status  12/04/2019 89 > OR = 60 mL/min/1.64m2 Final   GFR, Est Non African American  Date Value Ref Range Status  12/04/2019 77 > OR = 60 mL/min/1.64m2 Final   eGFR  Date Value Ref Range Status  11/15/2023 35 (L) > OR = 60 mL/min/1.34m2 Final         Passed - Patient is not pregnant      Passed - Valid encounter within last 12 months    Recent Outpatient Visits           3 weeks ago Insomnia, unspecified type   [place] [facility] Medicine Duanne Butler DASEN, MD   1 month ago Diabetes mellitus type 2 with complications [facility])   [place] [facility] Medicine Pickard, Butler DASEN, MD   1 month ago Walking pneumonia   [place] [facility] Medicine Duanne Butler DASEN, MD   7 months ago Diabetes mellitus type 2 with complications [facility])   [place] [facility] Medicine Pickard, Butler DASEN, MD   10 months ago Encounter for screening mammogram for malignant neoplasm of breast   [place] [facility] Medicine Pickard, Butler DASEN, MD              Refused Prescriptions Disp Refills   azithromycin  (ZITHROMAX ) 250 MG tablet [Pharmacy Med Name: AZITHROMYCIN  250 MG TABLET] 6 tablet 0    Sig: TAKE 2 TABLETS BY MOUTH TODAY, THEN TAKE 1 TABLET DAILY FOR 4 DAYS AS DIRECTED     Off-Protocol Failed - 12/23/2023  5:45 PM      Failed - Medication not assigned to a  protocol, review manually.      Passed - Valid encounter within last 12 months    Recent Outpatient Visits           3 weeks ago Insomnia, unspecified type   [place] [facility] Duanne Butler DASEN, MD   1 month ago Diabetes mellitus type 2 with complications [facility])   [place] [facility] Medicine Pickard, Butler DASEN, MD   1 month ago Walking pneumonia   [place] [facility] Medicine Duanne Butler DASEN, MD   7 months ago Diabetes mellitus type 2 with complications [facility])   [place] [facility] Family Medicine Pickard, Butler DASEN, MD   10 months ago Encounter for screening mammogram for malignant neoplasm of breast   [place] [facility] Duanne Butler DASEN, MD               amoxicillin -clavulanate (AUGMENTIN ) 875-125 MG tablet [Pharmacy Med Name: AMOXICILLIN -CLAV 875-125MG  TAB] 20 tablet 0    Sig: TAKE 1 TABLET BY MOUTH TWICE A DAY     Off-Protocol Failed - 12/23/2023  5:45 PM      Failed - Medication not assigned to a protocol, review manually.      Passed - Valid encounter within last 12 months    Recent Outpatient Visits           3 weeks ago Insomnia, unspecified type   [place] [facility] Medicine Duanne Butler DASEN, MD   1 month ago Diabetes mellitus type 2 with complications [facility])   [place] [facility] Family Medicine Pickard, Butler DASEN, MD   1 month ago Walking pneumonia   [place] [facility] Medicine Duanne Butler DASEN, MD   7 months ago Diabetes mellitus type 2 with complications [facility])   [facility] Pickard, Butler DASEN, MD   10 months ago Encounter for screening mammogram for malignant neoplasm of breast    [facility] Medicine Pickard, Butler DASEN, MD

## 2023-12-23 NOTE — Telephone Encounter (Signed)
 Requested Prescriptions  Pending Prescriptions Disp Refills   meloxicam  (MOBIC ) 7.5 MG tablet [Pharmacy Med Name: MELOXICAM  7.5 MG TABLET] 30 tablet 0    Sig: TAKE 1 TABLET BY MOUTH EVERY DAY     Analgesics:  COX2 Inhibitors Failed - 12/23/2023  5:44 PM      Failed - Manual Review: Labs are only required if the patient has taken medication for more than 8 weeks.      Failed - Cr in normal range and within 360 days    Creat  Date Value Ref Range Status  11/25/2023 1.14 (H) 0.60 - 1.00 mg/dL Final   Creatinine, Urine  Date Value Ref Range Status  11/15/2023 253 20 - 275 mg/dL Final         Passed - HGB in normal range and within 360 days    Hemoglobin  Date Value Ref Range Status  11/15/2023 12.7 11.7 - 15.5 g/dL Final         Passed - HCT in normal range and within 360 days    HCT  Date Value Ref Range Status  11/15/2023 39.9 35.0 - 45.0 % Final         Passed - AST in normal range and within 360 days    AST  Date Value Ref Range Status  11/15/2023 13 10 - 35 U/L Final         Passed - ALT in normal range and within 360 days    ALT  Date Value Ref Range Status  11/15/2023 10 6 - 29 U/L Final         Passed - eGFR is 30 or above and within 360 days    GFR, Est African American  Date Value Ref Range Status  12/04/2019 89 > OR = 60 mL/min/1.62m2 Final   GFR, Est Non African American  Date Value Ref Range Status  12/04/2019 77 > OR = 60 mL/min/1.29m2 Final   eGFR  Date Value Ref Range Status  11/15/2023 35 (L) > OR = 60 mL/min/1.39m2 Final         Passed - Patient is not pregnant      Passed - Valid encounter within last 12 months    Recent Outpatient Visits           3 weeks ago Insomnia, unspecified type   [place] [facility] Medicine Duanne Butler DASEN, MD   1 month ago Diabetes mellitus type 2 with complications [facility])   [place] [facility] Medicine Pickard, Butler DASEN, MD   1 month ago Walking pneumonia   [place] [facility]  Family Medicine Duanne Butler DASEN, MD   7 months ago Diabetes mellitus type 2 with complications [facility])   [place] [facility] Medicine Pickard, Butler DASEN, MD   10 months ago Encounter for screening mammogram for malignant neoplasm of breast   [place] [facility] Medicine Pickard, Butler DASEN, MD               promethazine  (PHENERGAN ) 25 MG tablet [Pharmacy Med Name: PROMETHAZINE  25 MG TABLET] 20 tablet 0    Sig: TAKE 1 TABLET BY MOUTH EVERY 8 HOURS AS NEEDED FOR NAUSEA OR VOMITING.     Not Delegated - Gastroenterology: Antiemetics Failed - 12/23/2023  5:44 PM      Failed - This refill cannot be delegated      Passed - Valid encounter within last 6 months    Recent Outpatient Visits  3 weeks ago Insomnia, unspecified type   [place] [facility] Duanne Butler DASEN, MD   1 month ago Diabetes mellitus type 2 with complications [facility])   [place] Family Medicine Pickard, Butler DASEN, MD   1 month ago Walking pneumonia   [place] [facility] Medicine Duanne Butler DASEN, MD   7 months ago Diabetes mellitus type 2 with complications [facility])   [place] [facility] Family Medicine Pickard, Butler DASEN, MD   10 months ago Encounter for screening mammogram for malignant neoplasm of breast   [place] [facility] Family Medicine Pickard, Butler DASEN, MD              Refused Prescriptions Disp Refills   azithromycin  (ZITHROMAX ) 250 MG tablet [Pharmacy Med Name: AZITHROMYCIN  250 MG TABLET] 6 tablet 0    Sig: TAKE 2 TABLETS BY MOUTH TODAY, THEN TAKE 1 TABLET DAILY FOR 4 DAYS AS DIRECTED     Off-Protocol Failed - 12/23/2023  5:44 PM      Failed - Medication not assigned to a protocol, review manually.      Passed - Valid encounter within last 12 months    Recent Outpatient Visits           3 weeks ago Insomnia, unspecified type   [place] [facility] Medicine Duanne Butler DASEN, MD   1 month ago Diabetes  mellitus type 2 with complications [facility])   [place] [facility] Family Medicine Pickard, Butler DASEN, MD   1 month ago Walking pneumonia   [place] [facility] Medicine Duanne Butler DASEN, MD   7 months ago Diabetes mellitus type 2 with complications [facility])   [place] [facility] Medicine Pickard, Butler DASEN, MD   10 months ago Encounter for screening mammogram for malignant neoplasm of breast   [place] [facility] Medicine Duanne Butler DASEN, MD               amoxicillin -clavulanate (AUGMENTIN ) 875-125 MG tablet [Pharmacy Med Name: AMOXICILLIN -CLAV 875-125MG  TAB] 20 tablet 0    Sig: TAKE 1 TABLET BY MOUTH TWICE A DAY     Off-Protocol Failed - 12/23/2023  5:44 PM      Failed - Medication not assigned to a protocol, review manually.      Passed - Valid encounter within last 12 months    Recent Outpatient Visits           3 weeks ago Insomnia, unspecified type   [place] [facility] Medicine Duanne Butler DASEN, MD   1 month ago Diabetes mellitus type 2 with complications [facility])   [place] [facility] Family Medicine Pickard, Butler DASEN, MD   1 month ago Walking pneumonia   [place] [facility] Medicine Duanne Butler DASEN, MD   7 months ago Diabetes mellitus type 2 with complications [facility])   [place] [facility] Pickard, Butler DASEN, MD   10 months ago Encounter for screening mammogram for malignant neoplasm of breast    [facility] Medicine Pickard, Butler DASEN, MD

## 2024-01-14 ENCOUNTER — Ambulatory Visit (INDEPENDENT_AMBULATORY_CARE_PROVIDER_SITE_OTHER): Admitting: Family Medicine

## 2024-01-14 VITALS — BP 110/62 | HR 64 | Temp 97.6°F | Ht 64.0 in | Wt 173.0 lb

## 2024-01-14 DIAGNOSIS — M545 Low back pain, unspecified: Secondary | ICD-10-CM

## 2024-01-14 LAB — URINALYSIS, ROUTINE W REFLEX MICROSCOPIC
Bacteria, UA: NONE SEEN /HPF
Glucose, UA: NEGATIVE
Hyaline Cast: NONE SEEN /LPF
Leukocytes,Ua: NEGATIVE
Nitrite: NEGATIVE
Protein, ur: NEGATIVE
Specific Gravity, Urine: 1.02 (ref 1.001–1.035)
pH: 5.5 (ref 5.0–8.0)

## 2024-01-14 LAB — MICROSCOPIC MESSAGE

## 2024-01-14 MED ORDER — CYCLOBENZAPRINE HCL 10 MG PO TABS: 10.0000 mg | ORAL_TABLET | Freq: Three times a day (TID) | ORAL | 0 refills | Status: AC | PRN

## 2024-01-14 MED ORDER — MELOXICAM 15 MG PO TABS: 15.0000 mg | ORAL_TABLET | Freq: Every day | ORAL | 0 refills | Status: AC

## 2024-01-14 NOTE — Progress Notes (Signed)
 Subjective:    Patient ID: Amanda Pope, female    DOB: [birth year], 78 y.o.   MRN: [id number]  Symptoms again a little more than 1 week ago.  Symptoms include a sharp pain in her right lower flank just above her right gluteus.  This is worse when she stands up from a seated position.  It is worse when she gets out of bed.  It improves with standing and walking.  She is tender to palpation over the superior gluteus and the lower right lumbar paraspinal muscles.  She denies any sciatica.  She denies any dysuria.  She denies any radiating right flank pain.  She denies any hematuria.  She denies any fevers and chills. Past Medical History:  Diagnosis Date   Allergy    Arthritis    hip & back   Asthma    Cataract    COPD (chronic obstructive pulmonary disease) (HCC)    Cystocele    Depression    Diabetes mellitus without complication (HCC)    Phreesia 05/27/2020   GERD (gastroesophageal reflux disease)    History of Helicobacter pylori infection    Hypertension    Neuromuscular disorder (HCC)    Shortness of breath dyspnea    Urticaria    Viral URI with cough 10/16/2022   Vitamin D  deficiency    Past Surgical History:  Procedure Laterality Date   ABDOMINAL HYSTERECTOMY     And Removed 1 ovary   APPENDECTOMY     CHOLECYSTECTOMY  05/30/2007   EYE SURGERY     JOINT REPLACEMENT     TONSILLECTOMY     TOTAL HIP ARTHROPLASTY Right 06/09/2015   TOTAL HIP ARTHROPLASTY Right 06/09/2015   Procedure: RIGHT TOTAL HIP ARTHROPLASTY ANTERIOR APPROACH;  Surgeon: Kay CHRISTELLA Cummins, MD;  Location: [facility] OR;  Service: Orthopedics;  Laterality: Right;   TUBAL LIGATION     Current Outpatient Medications on File Prior to Visit  Medication Sig Dispense Refill   Accu-Chek FastClix Lancets MISC Use to check FBS daily DX: E11.9 100 each 3   albuterol  (VENTOLIN  HFA) 108 (90 Base) MCG/ACT inhaler Inhale 2 puffs into the lungs every 6 (six) hours as needed for wheezing or shortness of breath. 2 each 3    ALPRAZolam  (XANAX ) 0.5 MG tablet Take 1 tablet (0.5 mg total) by mouth at bedtime as needed for anxiety. 30 tablet 0   blood glucose meter kit and supplies Dispense based on patient and insurance preference. Use up to 1 time daily as directed. (FOR ICD-10 E10.9, E11.9). 1 each 0   Blood Glucose Monitoring Suppl (ONETOUCH VERIO FLEX SYSTEM) w/Device KIT Use to check blood sugars daily 1 kit 0   BREZTRI  AEROSPHERE 160-9-4.8 MCG/ACT AERO inhaler INHALE 2 PUFFS INTO THE LUNGS TWICE A DAY 10.7 each 11   calcium  gluconate 500 MG tablet Take 1 tablet (500 mg total) by mouth 3 (three) times daily. 30 tablet 11   [facility] D3 2000 units CAPS TAKE 2 CAPSULES BY MOUTH ONCE A DAY 60 capsule 9   estradiol  (ESTRACE ) 0.5 MG tablet Take 1 tablet (0.5 mg total) by mouth daily. 90 tablet 1   fluticasone  (FLONASE ) 50 MCG/ACT nasal spray SPRAY 2 SPRAYS INTO EACH NOSTRIL EVERY DAY 48 mL 2   gabapentin  (NEURONTIN ) 300 MG capsule TAKE 1 CAPSULE BY MOUTH THREE TIMES A DAY 90 capsule 3   Glucose Blood (BLOOD GLUCOSE TEST STRIPS 333) STRP USE TO CHECK FASTING BLOOD SUGAR DAILY. 100 strip 3  hydrochlorothiazide  (HYDRODIURIL ) 25 MG tablet Take 1 tablet (25 mg total) by mouth daily. 90 tablet 1   HYDROcodone  bit-homatropine (HYCODAN) 5-1.5 MG/5ML syrup Take 5 mLs by mouth every 8 (eight) hours as needed for cough. 120 mL 0   Lancets Misc. (ACCU-CHEK FASTCLIX LANCET) KIT Use to check FBS daily DX: E11.9 1 kit 1   levocetirizine (XYZAL ) 5 MG tablet TAKE 1 TABLET BY MOUTH EVERY DAY IN THE EVENING 90 tablet 1   losartan  (COZAAR ) 100 MG tablet Take 1 tablet (100 mg total) by mouth daily. 90 tablet 1   meloxicam  (MOBIC ) 7.5 MG tablet TAKE 1 TABLET BY MOUTH EVERY DAY 30 tablet 0   ondansetron  (ZOFRAN ) 4 MG tablet Take 1 tablet (4 mg total) by mouth every 8 (eight) hours as needed for nausea or vomiting. 20 tablet 0   pantoprazole  (PROTONIX ) 40 MG tablet Take 1 tablet (40 mg total) by mouth 2 (two) times daily. 60 tablet 1   promethazine   (PHENERGAN ) 25 MG tablet TAKE 1 TABLET BY MOUTH EVERY 8 HOURS AS NEEDED FOR NAUSEA OR VOMITING. 20 tablet 0   tirzepatide  (MOUNJARO ) 12.5 MG/0.5ML Pen Inject 12.5 mg into the skin once a week. 6 mL 3   triamcinolone  cream (KENALOG ) 0.1 % Apply topically 2 (two) times daily. 30 g 1   No current facility-administered medications on file prior to visit.     Allergies  Allergen Reactions   Prevalite  [Cholestyramine  Light] Hives   Ace Inhibitors Rash   Social History   Socioeconomic History   Marital status: Married    Spouse name: Not on file   Number of children: Not on file   Years of education: Not on file   Highest education level: Not on file  Occupational History   Not on file  Tobacco Use   Smoking status: Former    Current packs/day: 0.00    Average packs/day: 1 pack/day for 45.7 years (45.7 ttl pk-yrs)    Types: Cigarettes    Start date: 01/29/1965    Quit date: 10/30/2010    Years since quitting: 13.2   Smokeless tobacco: Never  Substance and Sexual Activity   Alcohol use: No   Drug use: No   Sexual activity: Yes    Comment: Hystorectomy  Other Topics Concern   Not on file  Social History Narrative   Entered 07/2013:   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   Married.   Social Drivers of Health   Tobacco Use: Medium Risk (12/18/2023)   Patient History    Smoking Tobacco Use: Former    Smokeless Tobacco Use: Never    Passive Exposure: Not on file  Financial Resource Strain: Low Risk (09/11/2023)   Overall Financial Resource Strain (CARDIA)    Difficulty of Paying Living Expenses: Not hard at all  Food Insecurity: No Food Insecurity (09/11/2023)   Epic    Worried About Programme Researcher, Broadcasting/film/video in the Last Year: Never true    Ran Out of Food in the Last Year: Never true  Transportation Needs: No Transportation Needs (09/11/2023)   Epic    Lack of Transportation (Medical): No    Lack of Transportation (Non-Medical): No  Physical Activity: Insufficiently  Active (09/11/2023)   Exercise Vital Sign    Days of Exercise per Week: 2 days    Minutes of Exercise per Session: 60 min  Stress: No Stress Concern Present (09/11/2023)   [employer] of Occupational Health - Occupational Stress Questionnaire  Feeling of Stress: Not at all  Social Connections: Moderately Integrated (09/11/2023)   Social Connection and Isolation Panel    Frequency of Communication with Friends and Family: More than three times a week    Frequency of Social Gatherings with Friends and Family: Twice a week    Attends Religious Services: 1 to 4 times per year    Active Member of [employer] or Organizations: No    Attends Banker Meetings: Never    Marital Status: Married  Catering Manager Violence: Not At Risk (09/11/2023)   Epic    Fear of Current or Ex-Partner: No    Emotionally Abused: No    Physically Abused: No    Sexually Abused: No  Depression (PHQ2-9): Low Risk (09/11/2023)   Depression (PHQ2-9)    PHQ-2 Score: 0  Alcohol Screen: Low Risk (09/11/2023)   Alcohol Screen    Last Alcohol Screening Score (AUDIT): 0  Housing: Unknown (09/11/2023)   Epic    Unable to Pay for Housing in the Last Year: No    Number of Times Moved in the Last Year: Not on file    Homeless in the Last Year: No  Utilities: Patient Unable To Answer (09/11/2023)   Epic    Threatened with loss of utilities: Patient unable to answer  Health Literacy: Adequate Health Literacy (09/11/2023)   B1300 Health Literacy    Frequency of need for help with medical instructions: Never     Review of Systems  All other systems reviewed and are negative.      Objective:   Physical Exam Constitutional:      General: She is not in acute distress.    Appearance: Normal appearance. She is not ill-appearing, toxic-appearing or diaphoretic.  HENT:     Nose: No congestion or rhinorrhea.     Mouth/Throat:     Mouth: Mucous membranes are moist.     Pharynx: No oropharyngeal exudate or  posterior oropharyngeal erythema.  Eyes:     Conjunctiva/sclera: Conjunctivae normal.  Cardiovascular:     Rate and Rhythm: Normal rate and regular rhythm.     Heart sounds: Normal heart sounds. No murmur heard. Pulmonary:     Effort: Pulmonary effort is normal. No respiratory distress.     Breath sounds: No stridor or decreased air movement. No wheezing, rhonchi or rales.  Chest:     Chest wall: No tenderness.  Abdominal:     General: Abdomen is flat. Bowel sounds are normal. There is no distension.     Palpations: Abdomen is soft.     Tenderness: There is no abdominal tenderness. There is no right CVA tenderness or guarding.  Musculoskeletal:     Lumbar back: Spasms and tenderness present. No swelling or bony tenderness. Decreased range of motion.       Back:  Neurological:     Mental Status: She is alert.           Assessment & Plan:  Acute right-sided low back pain without sciatica - Plan: cyclobenzaprine  (FLEXERIL ) 10 MG tablet, meloxicam  (MOBIC ) 15 MG tablet, Urinalysis, Routine w reflex microscopic I believe the patient pulled a muscle in her lower back.  I see no evidence of sciatica or herniated disc.  Her symptoms do not sound consistent with a kidney stone or kidney infection.  Begin meloxicam  15 mg daily as an anti-inflammatory and use Flexeril  10 mg every 8 hours as needed.  Anticipate gradual improvement over the next 7 to 10 days.  I  will check a urinalysis to be thorough.  If symptoms change or fevers develop she needs to be seen immediately

## 2024-01-15 ENCOUNTER — Ambulatory Visit (INDEPENDENT_AMBULATORY_CARE_PROVIDER_SITE_OTHER): Admitting: Otolaryngology

## 2024-02-12 ENCOUNTER — Other Ambulatory Visit: Payer: Self-pay | Admitting: Family Medicine

## 2024-02-12 DIAGNOSIS — Z79890 Hormone replacement therapy (postmenopausal): Secondary | ICD-10-CM

## 2024-02-12 DIAGNOSIS — I10 Essential (primary) hypertension: Secondary | ICD-10-CM

## 2024-02-12 NOTE — Telephone Encounter (Signed)
 Prescription Request  02/12/2024  LOV: 01/14/2024  What is the name of the medication or equipment?   estradiol  (ESTRACE ) 0.5 MG tablet   hydrochlorothiazide  (HYDRODIURIL ) 25 MG tablet  **90 day scripts requested**  Have you contacted your pharmacy to request a refill? Yes   Which pharmacy would you like this sent to?  [id number] [place], [place] - [address] [place] [address] Phone: [phone] Fax: [phone]    Patient notified that their request is being sent to the clinical staff for review and that they should receive a response within 2 business days.   Please advise pharmacist.

## 2024-02-13 MED ORDER — ESTRADIOL 0.5 MG PO TABS: 0.5000 mg | ORAL_TABLET | Freq: Every day | ORAL | 1 refills | Status: DC

## 2024-02-13 MED ORDER — HYDROCHLOROTHIAZIDE 25 MG PO TABS: 25.0000 mg | ORAL_TABLET | Freq: Every day | ORAL | 1 refills | Status: AC

## 2024-02-13 NOTE — Telephone Encounter (Signed)
 Requested Prescriptions  Pending Prescriptions Disp Refills   estradiol  (ESTRACE ) 0.5 MG tablet 90 tablet 1    Sig: Take 1 tablet (0.5 mg total) by mouth daily.     OB/GYN:  Estrogens Failed - 02/13/2024 11:59 AM      Failed - Mammogram is up-to-date per Health Maintenance      Passed - Last BP in normal range    BP Readings from Last 1 Encounters:  01/14/24 110/62         Passed - Valid encounter within last 12 months    Recent Outpatient Visits           1 month ago Acute right-sided low back pain without sciatica   [place] [facility] Duanne Butler DASEN, MD   2 months ago Insomnia, unspecified type   [place] [facility] Duanne Butler DASEN, MD   3 months ago Diabetes mellitus type 2 with complications [facility])   [place] [facility] Duanne, Butler DASEN, MD   3 months ago Walking pneumonia   [place] [facility] Duanne Butler DASEN, MD   9 months ago Diabetes mellitus type 2 with complications [facility])   [place] [facility] Pickard, Butler DASEN, MD               hydrochlorothiazide  (HYDRODIURIL ) 25 MG tablet 90 tablet 1    Sig: Take 1 tablet (25 mg total) by mouth daily.     Cardiovascular: Diuretics - Thiazide Failed - 02/13/2024 11:59 AM      Failed - Cr in normal range and within 180 days    Creat  Date Value Ref Range Status  11/25/2023 1.14 (H) 0.60 - 1.00 mg/dL Final   Creatinine, Urine  Date Value Ref Range Status  11/15/2023 253 20 - 275 mg/dL Final         Passed - K in normal range and within 180 days    Potassium  Date Value Ref Range Status  11/25/2023 4.1 3.5 - 5.3 mmol/L Final         Passed - Na in normal range and within 180 days    Sodium  Date Value Ref Range Status  11/25/2023 143 135 - 146 mmol/L Final         Passed - Last BP in normal range    BP Readings from Last 1 Encounters:  01/14/24 110/62         Passed - Valid encounter within last 6  months    Recent Outpatient Visits           1 month ago Acute right-sided low back pain without sciatica   [place] [facility] Duanne Butler DASEN, MD   2 months ago Insomnia, unspecified type   [place] [facility] Duanne Butler DASEN, MD   3 months ago Diabetes mellitus type 2 with complications [facility])   [place] [facility] Duanne, Butler DASEN, MD   3 months ago Walking pneumonia   [place] [facility] Duanne Butler DASEN, MD   9 months ago Diabetes mellitus type 2 with complications [facility])   [place] [facility] Pickard, Butler DASEN, MD

## 2024-03-04 ENCOUNTER — Ambulatory Visit: Payer: Self-pay

## 2024-03-04 NOTE — Telephone Encounter (Signed)
 FYI Only or Action Required?: FYI only for provider: appointment scheduled on 2/5.  Patient was last seen in primary care on 01/14/2024 by Duanne Butler DASEN, MD.  Called Nurse Triage reporting Insomnia.  Symptoms began several months ago.  Interventions attempted: Prescription medications: xanax  not helped.  Symptoms are: gradually worsening.  Triage Disposition: See PCP When Office is Open (Within 3 Days)  Patient/caregiver understands and will follow disposition?: Yes      Reason for Disposition  MODERATE - SEVERE insomnia (e.g., awake most of night; lack of sleep interferes with ability to function during the day; interferes with work or school)  Answer Assessment - Initial Assessment Questions This RN recommended pt be examined in next 3 days, scheduled for 2/5 with PCP. Advised call back or seek immediate care if new or worsening symptoms.     1. DESCRIPTION: Tell me about your sleeping problem. (e.g., waking frequently during night, sleeping during day and awake at night, trouble falling asleep) How bad is it?      Used to just be every once in a while then a couple times a week then now night after night Last good night sleep 4 nights ago 2:30 am- 11:30 am Last night was awake until 5:30 am, finally dozed off then, got up at 10:30 am, felt so bad and so tired, laid on couch, dozed off couple more times Can't seem to operate or do anything middle of day Getting so overwhelmed, got things to do and no time to get it done or didn't feel like doing nothing Just lay there, think about anything under the sun, everything just keeps going through my mind and can't shut off, think that's why not sleeping Some nights wouldn't go to sleep at all, even awake 7 or 8 am  5. PAIN: Do you have any pain that is keeping you awake? (e.g., back pain, joint pain) If Yes, ask How bad is the pain? (e.g., scale 0-10; mild, moderate, severe).     Denies  9. TREATMENT: What have you  done so far to treat this sleep problem? (e.g., prescription or OTC sleep medicines, herbal or dietary supplements, cannabis, relaxation strategies)     Talked to Dr. Duanne about it happening occasionally 3-4 months ago, gave me xanax , tried a few times - first time worked great, second and third time it didn't helped and was up all night long  10. OTHER SYMPTOMS: Do you have any other symptoms?  (e.g., difficulty breathing)       Anxiety - When gets to bedtime, start dreading it, am I gonna be up all night  Denies: Pain Sadness/hopelessness Thoughts or feelings of killing/harming herself or others Other symptoms  Protocols used: Insomnia-A-AH

## 2024-03-05 ENCOUNTER — Telehealth: Payer: Self-pay | Admitting: Family Medicine

## 2024-03-05 ENCOUNTER — Other Ambulatory Visit: Payer: Self-pay

## 2024-03-05 ENCOUNTER — Ambulatory Visit: Admitting: Family Medicine

## 2024-03-05 ENCOUNTER — Encounter: Payer: Self-pay | Admitting: Family Medicine

## 2024-03-05 VITALS — BP 124/82 | HR 67 | Temp 98.5°F | Ht 64.0 in | Wt 171.4 lb

## 2024-03-05 DIAGNOSIS — I10 Essential (primary) hypertension: Secondary | ICD-10-CM

## 2024-03-05 DIAGNOSIS — G4700 Insomnia, unspecified: Secondary | ICD-10-CM

## 2024-03-05 MED ORDER — LOSARTAN POTASSIUM 100 MG PO TABS: 100.0000 mg | ORAL_TABLET | Freq: Every day | ORAL | 1 refills | Status: AC

## 2024-03-05 MED ORDER — TRAZODONE HCL 50 MG PO TABS: 50.0000 mg | ORAL_TABLET | Freq: Every evening | ORAL | 3 refills | Status: AC | PRN

## 2024-03-05 NOTE — Progress Notes (Signed)
 "   Subjective:    Patient ID: Amanda Pope, female    DOB: 1945-09-20, 79 y.o.   MRN: [id number] Patient continues to suffer with insomnia.  I originally saw her in October and we gave her Xanax  to take sparingly as needed.  This initially worked but now this is not even working.  The patient states that she will lay in bed unable to sleep for hours.  She states that her mind will constantly perseverate over the days events.  She has tried stopping television and looking at her phone 2 hours before bed.  She is trying to avoid caffeine.  She is not exercising. Past Medical History:  Diagnosis Date   Allergy    Arthritis    hip & back   Asthma    Cataract    COPD (chronic obstructive pulmonary disease) (HCC)    Cystocele    Depression    Diabetes mellitus without complication (HCC)    Phreesia 05/27/2020   GERD (gastroesophageal reflux disease)    History of Helicobacter pylori infection    Hypertension    Neuromuscular disorder (HCC)    Shortness of breath dyspnea    Urticaria    Viral URI with cough 10/16/2022   Vitamin D  deficiency    Past Surgical History:  Procedure Laterality Date   ABDOMINAL HYSTERECTOMY     And Removed 1 ovary   APPENDECTOMY     CHOLECYSTECTOMY  05/30/2007   EYE SURGERY     JOINT REPLACEMENT     TONSILLECTOMY     TOTAL HIP ARTHROPLASTY Right 06/09/2015   TOTAL HIP ARTHROPLASTY Right 06/09/2015   Procedure: RIGHT TOTAL HIP ARTHROPLASTY ANTERIOR APPROACH;  Surgeon: Kay CHRISTELLA Cummins, MD;  Location: [facility] OR;  Service: Orthopedics;  Laterality: Right;   TUBAL LIGATION     Current Outpatient Medications on File Prior to Visit  Medication Sig Dispense Refill   albuterol  (VENTOLIN  HFA) 108 (90 Base) MCG/ACT inhaler Inhale 2 puffs into the lungs every 6 (six) hours as needed for wheezing or shortness of breath. 2 each 3   Blood Glucose Monitoring Suppl (ONETOUCH VERIO FLEX SYSTEM) w/Device KIT Use to check blood sugars daily 1 kit 0   BREZTRI  AEROSPHERE 160-9-4.8  MCG/ACT AERO inhaler INHALE 2 PUFFS INTO THE LUNGS TWICE A DAY 10.7 each 11   calcium  gluconate 500 MG tablet Take 1 tablet (500 mg total) by mouth 3 (three) times daily. 30 tablet 11   [facility] D3 2000 units CAPS TAKE 2 CAPSULES BY MOUTH ONCE A DAY 60 capsule 9   cyclobenzaprine  (FLEXERIL ) 10 MG tablet Take 1 tablet (10 mg total) by mouth 3 (three) times daily as needed for muscle spasms. 30 tablet 0   gabapentin  (NEURONTIN ) 300 MG capsule TAKE 1 CAPSULE BY MOUTH THREE TIMES A DAY 90 capsule 3   Glucose Blood (BLOOD GLUCOSE TEST STRIPS 333) STRP USE TO CHECK FASTING BLOOD SUGAR DAILY. 100 strip 3   hydrochlorothiazide  (HYDRODIURIL ) 25 MG tablet Take 1 tablet (25 mg total) by mouth daily. 90 tablet 1   Lancets Misc. (ACCU-CHEK FASTCLIX LANCET) KIT Use to check FBS daily DX: E11.9 1 kit 1   levocetirizine (XYZAL ) 5 MG tablet TAKE 1 TABLET BY MOUTH EVERY DAY IN THE EVENING 90 tablet 1   meloxicam  (MOBIC ) 15 MG tablet Take 1 tablet (15 mg total) by mouth daily. 30 tablet 0   pantoprazole  (PROTONIX ) 40 MG tablet Take 1 tablet (40 mg total) by mouth 2 (two) times daily.  60 tablet 1   tirzepatide  (MOUNJARO ) 12.5 MG/0.5ML Pen Inject 12.5 mg into the skin once a week. 6 mL 3   triamcinolone  cream (KENALOG ) 0.1 % Apply topically 2 (two) times daily. 30 g 1   ALPRAZolam  (XANAX ) 0.5 MG tablet Take 1 tablet (0.5 mg total) by mouth at bedtime as needed for anxiety. (Patient not taking: Reported on 03/05/2024) 30 tablet 0   No current facility-administered medications on file prior to visit.     Allergies  Allergen Reactions   Prevalite  [Cholestyramine  Light] Hives   Ace Inhibitors Rash   Social History   Socioeconomic History   Marital status: Married    Spouse name: Not on file   Number of children: Not on file   Years of education: Not on file   Highest education level: Not on file  Occupational History   Not on file  Tobacco Use   Smoking status: Former    Current packs/day: 0.00    Average  packs/day: 1 pack/day for 45.7 years (45.7 ttl pk-yrs)    Types: Cigarettes    Start date: 01/29/1965    Quit date: 10/30/2010    Years since quitting: 13.3   Smokeless tobacco: Never  Substance and Sexual Activity   Alcohol use: No   Drug use: No   Sexual activity: Yes    Comment: Hystorectomy  Other Topics Concern   Not on file  Social History Narrative   Entered 07/2013:   Is retired. 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 Loves it.   Married.   Social Drivers of Health   Tobacco Use: Medium Risk (03/05/2024)   Patient History    Smoking Tobacco Use: Former    Smokeless Tobacco Use: Never    Passive Exposure: Not on file  Financial Resource Strain: Low Risk (09/11/2023)   Overall Financial Resource Strain (CARDIA)    Difficulty of Paying Living Expenses: Not hard at all  Food Insecurity: No Food Insecurity (09/11/2023)   Epic    Worried About Programme Researcher, Broadcasting/film/video in the Last Year: Never true    Ran Out of Food in the Last Year: Never true  Transportation Needs: No Transportation Needs (09/11/2023)   Epic    Lack of Transportation (Medical): No    Lack of Transportation (Non-Medical): No  Physical Activity: Insufficiently Active (09/11/2023)   Exercise Vital Sign    Days of Exercise per Week: 2 days    Minutes of Exercise per Session: 60 min  Stress: No Stress Concern Present (09/11/2023)   [employer] of Occupational Health - Occupational Stress Questionnaire    Feeling of Stress: Not at all  Social Connections: Moderately Integrated (09/11/2023)   Social Connection and Isolation Panel    Frequency of Communication with Friends and Family: More than three times a week    Frequency of Social Gatherings with Friends and Family: Twice a week    Attends Religious Services: 1 to 4 times per year    Active Member of [employer] or Organizations: No    Attends Banker Meetings: Never    Marital Status: Married  Catering Manager Violence: Not At Risk (09/11/2023)   Epic    Fear of  Current or Ex-Partner: No    Emotionally Abused: No    Physically Abused: No    Sexually Abused: No  Depression (PHQ2-9): Medium Risk (03/05/2024)   Depression (PHQ2-9)    PHQ-2 Score: 6  Alcohol Screen: Low Risk (09/11/2023)   Alcohol Screen    Last Alcohol  Screening Score (AUDIT): 0  Housing: Unknown (09/11/2023)   Epic    Unable to Pay for Housing in the Last Year: No    Number of Times Moved in the Last Year: Not on file    Homeless in the Last Year: No  Utilities: Patient Unable To Answer (09/11/2023)   Epic    Threatened with loss of utilities: Patient unable to answer  Health Literacy: Adequate Health Literacy (09/11/2023)   B1300 Health Literacy    Frequency of need for help with medical instructions: Never     Review of Systems  All other systems reviewed and are negative.      Objective:   Physical Exam Constitutional:      General: She is not in acute distress.    Appearance: Normal appearance. She is not ill-appearing, toxic-appearing or diaphoretic.  HENT:     Nose: No congestion or rhinorrhea.     Mouth/Throat:     Mouth: Mucous membranes are dry.     Pharynx: No oropharyngeal exudate or posterior oropharyngeal erythema.  Eyes:     Conjunctiva/sclera: Conjunctivae normal.  Cardiovascular:     Rate and Rhythm: Normal rate and regular rhythm.     Heart sounds: Normal heart sounds. No murmur heard. Pulmonary:     Effort: Pulmonary effort is normal. No respiratory distress.     Breath sounds: No stridor or decreased air movement. No wheezing, rhonchi or rales.  Chest:     Chest wall: No tenderness.  Abdominal:     General: Abdomen is flat. Bowel sounds are normal. There is no distension.     Palpations: Abdomen is soft.     Tenderness: There is no abdominal tenderness. There is no right CVA tenderness or guarding.  Neurological:     Mental Status: She is alert.           Assessment & Plan:   Insomnia, unspecified type We will stop Xanax  and replace  with trazodone  50 mg p.o. nightly.  The patient can gradually uptitrate to a maximum of 150 mg p.o. nightly if necessary.  If this does not work we may try Ambien.  Patient is also elected to discontinue Mounjaro  and recheck lab work in 3 months "

## 2024-03-05 NOTE — Telephone Encounter (Signed)
 Prescription Request  03/05/2024  LOV: 01/14/2024  What is the name of the medication or equipment? losartan  (COZAAR ) 100 MG tablet   Have you contacted your pharmacy to request a refill? Yes   Which pharmacy would you like this sent to?  [id number] [place], [place] - [address] [place] [address] Phone: [phone] Fax: [phone]    Patient notified that their request is being sent to the clinical staff for review and that they should receive a response within 2 business days.   Please advise at [facility] [phone]

## 2024-05-04 ENCOUNTER — Ambulatory Visit: Admitting: Family Medicine

## 2024-09-16 ENCOUNTER — Ambulatory Visit
